# Patient Record
Sex: Female | Born: 1995 | Race: Black or African American | Hispanic: No | Marital: Single | Smoking: Never smoker
Health system: Southern US, Community
[De-identification: ages and names within clinical notes are randomized; demographics above are authoritative.]

## PROBLEM LIST (undated history)

## (undated) ENCOUNTER — Emergency Department (HOSPITAL_BASED_OUTPATIENT_CLINIC_OR_DEPARTMENT_OTHER): Admission: EM | Payer: MEDICAID

## (undated) DIAGNOSIS — J45909 Unspecified asthma, uncomplicated: Secondary | ICD-10-CM

## (undated) DIAGNOSIS — E119 Type 2 diabetes mellitus without complications: Secondary | ICD-10-CM

## (undated) DIAGNOSIS — N809 Endometriosis, unspecified: Secondary | ICD-10-CM

## (undated) DIAGNOSIS — F419 Anxiety disorder, unspecified: Secondary | ICD-10-CM

## (undated) DIAGNOSIS — J189 Pneumonia, unspecified organism: Secondary | ICD-10-CM

## (undated) DIAGNOSIS — L732 Hidradenitis suppurativa: Secondary | ICD-10-CM

## (undated) DIAGNOSIS — Z789 Other specified health status: Secondary | ICD-10-CM

## (undated) HISTORY — PX: TONSILLECTOMY: SUR1361

## (undated) HISTORY — DX: Hidradenitis suppurativa: L73.2

## (undated) HISTORY — PX: CRANIOTOMY FOR REPAIR DURAL / CSF LEAK: SUR338

## (undated) HISTORY — PX: EYE SURGERY: SHX253

---
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
## 2014-07-30 ENCOUNTER — Encounter: Payer: Self-pay | Admitting: Emergency Medicine

## 2014-07-30 ENCOUNTER — Emergency Department
Admission: EM | Admit: 2014-07-30 | Discharge: 2014-07-30 | Disposition: A | Discharge status: Home / Self Care | Payer: BLUE CROSS/BLUE SHIELD | Attending: Emergency Medicine | Admitting: Emergency Medicine

## 2014-07-30 ENCOUNTER — Emergency Department: Payer: BLUE CROSS/BLUE SHIELD

## 2014-07-30 DIAGNOSIS — J45901 Unspecified asthma with (acute) exacerbation: Secondary | ICD-10-CM | POA: Insufficient documentation

## 2014-07-30 DIAGNOSIS — E119 Type 2 diabetes mellitus without complications: Secondary | ICD-10-CM | POA: Insufficient documentation

## 2014-07-30 DIAGNOSIS — J209 Acute bronchitis, unspecified: Secondary | ICD-10-CM

## 2014-07-30 DIAGNOSIS — J45909 Unspecified asthma, uncomplicated: Secondary | ICD-10-CM

## 2014-07-30 DIAGNOSIS — R05 Cough: Secondary | ICD-10-CM | POA: Diagnosis present

## 2014-07-30 HISTORY — DX: Type 2 diabetes mellitus without complications: E11.9

## 2014-07-30 HISTORY — DX: Unspecified asthma, uncomplicated: J45.909

## 2014-07-30 MED ORDER — LEVOFLOXACIN 750 MG PO TABS: 750.0000 mg | ORAL_TABLET | Freq: Every day | ORAL | Status: AC

## 2014-07-30 MED ORDER — ALBUTEROL SULFATE (2.5 MG/3ML) 0.083% IN NEBU
2.5000 mg | INHALATION_SOLUTION | Freq: Once | RESPIRATORY_TRACT | Status: AC
  Administered 2014-07-30: 2.5 mg via RESPIRATORY_TRACT

## 2014-07-30 MED ORDER — IPRATROPIUM-ALBUTEROL 0.5-2.5 (3) MG/3ML IN SOLN
RESPIRATORY_TRACT | Status: AC
  Filled 2014-07-30: qty 3

## 2014-07-30 MED ORDER — ALBUTEROL SULFATE (2.5 MG/3ML) 0.083% IN NEBU: 2.5000 mg | INHALATION_SOLUTION | Freq: Four times a day (QID) | RESPIRATORY_TRACT | Status: DC | PRN

## 2014-07-30 MED ORDER — IPRATROPIUM-ALBUTEROL 0.5-2.5 (3) MG/3ML IN SOLN
3.0000 mL | Freq: Once | RESPIRATORY_TRACT | Status: AC
  Administered 2014-07-30: 3 mL via RESPIRATORY_TRACT

## 2014-07-30 MED ORDER — ALBUTEROL SULFATE (2.5 MG/3ML) 0.083% IN NEBU
INHALATION_SOLUTION | RESPIRATORY_TRACT | Status: AC
  Filled 2014-07-30: qty 3

## 2014-07-30 MED ORDER — ALBUTEROL SULFATE HFA 108 (90 BASE) MCG/ACT IN AERS: 2.0000 | INHALATION_SPRAY | Freq: Four times a day (QID) | RESPIRATORY_TRACT | Status: DC | PRN

## 2014-07-30 NOTE — ED Notes (Signed)
Productive cough

## 2014-07-30 NOTE — ED Provider Notes (Signed)
[facility] Emergency Department Provider Note  ____________________________________________  Time seen: Approximately 3:02 PM  I have reviewed the triage vital signs and the nursing notes.   HISTORY  Chief Complaint Cough    HPI Shari [name] is a 19 y.o. female who presents to the emergency department for cough and wheezing. She states that her primary care provider started her on prednisone 2 days ago. She also states that she has been using her albuterol inhaler without any relief. She states that the cough is now productive and the wheezing is getting worse. She denies fever.   Past Medical History  Diagnosis Date  . Asthma   . Diabetes mellitus without complication     There are no active problems to display for this patient.   Past Surgical History  Procedure Laterality Date  . Eye surgery    . Tonsillectomy      No current outpatient prescriptions on file.  Allergies Review of patient's allergies indicates no known allergies.  No family history on file.  Social History History  Substance Use Topics  . Smoking status: Never Smoker   . Smokeless tobacco: Not on file  . Alcohol Use: No    Review of Systems Constitutional: No fever/chills Eyes: No visual changes. ENT: No sore throat. Cardiovascular: Denies chest pain. Respiratory: Mild shortness of breath. Gastrointestinal: No abdominal pain.  No nausea, no vomiting.  No diarrhea.  No constipation. Genitourinary: Negative for dysuria. Musculoskeletal: Negative for back pain. Skin: Negative for rash. Neurological: Negative for headaches, focal weakness or numbness.  10-point ROS otherwise negative.  ____________________________________________   PHYSICAL EXAM:  VITAL SIGNS: ED Triage Vitals  Enc Vitals Group     BP 07/30/14 1359 130/57 mmHg     Pulse Rate 07/30/14 1359 86     Resp 07/30/14 1359 16     Temp 07/30/14 1359 98.5 F (36.9 C)     Temp Source 07/30/14  1359 Oral     SpO2 07/30/14 1359 100 %     Weight 07/30/14 1359 262 lb (118.842 kg)     Height 07/30/14 1359 5\' 8"  (1.727 m)     Head Cir --      Peak Flow --      Pain Score 07/30/14 1401 8     Pain Loc --      Pain Edu? --      Excl. in [place]? --     Constitutional: Alert and oriented. Well appearing and in no acute distress. Eyes: Conjunctivae are normal. PERRL. EOMI. Head: Atraumatic. Nose: No congestion/rhinnorhea. Mouth/Throat: Mucous membranes are moist.  Oropharynx non-erythematous. Neck: No stridor.   Cardiovascular: Normal rate, regular rhythm. Grossly normal heart sounds.  Good peripheral circulation. Respiratory: Normal respiratory effort.  No retractions. Expiratory wheezes noted throughout lung fields. Gastrointestinal: Soft and nontender. No distention. No abdominal bruits. No CVA tenderness. Musculoskeletal: No lower extremity tenderness nor edema.  No joint effusions. Neurologic:  Normal speech and language. No gross focal neurologic deficits are appreciated. Speech is normal. No gait instability. Skin:  Skin is warm, dry and intact. No rash noted. Psychiatric: Mood and affect are normal. Speech and behavior are normal.  ____________________________________________   LABS (all labs ordered are listed, but only abnormal results are displayed)  Labs Reviewed - No data to display ____________________________________________  EKG   ____________________________________________  RADIOLOGY  Chest x-ray viewed by me. No infiltrate or opacity noted. Radiology reading concurs. ____________________________________________   PROCEDURES  Procedure(s) performed: None  Critical  Care performed: No  ____________________________________________   INITIAL IMPRESSION / ASSESSMENT AND PLAN / ED COURSE  Pertinent labs & imaging results that were available during my care of the patient were reviewed by me and considered in my medical decision making (see chart for  details).  Patient received a DuoNeb treatment and an albuterol treatment while in the emergency department. Air movement much improved after the second treatment. She was advised to continue the prednisone. She will receive a refill on her albuterol nebulizer solution. She was advised to use her Ventolin inhaler or nebulizer every 4 hours for the next 24 hours. She was advised to take the antibiotic as prescribed without skipping doses.  Patient was advised to follow up with her primary care provider for symptoms that are not improving over the next 2-3 days. She was advised to return to the ER for symptoms that change or worsen if she is unable to schedule an appointment.  ____________________________________________   FINAL CLINICAL IMPRESSION(S) / ED DIAGNOSES  Final diagnoses:  None      Victorino Dike, FNP 07/30/14 1822  Nena Polio, MD 07/31/14 [phone]

## 2014-07-30 NOTE — Discharge Instructions (Signed)

## 2014-08-02 ENCOUNTER — Encounter: Payer: Self-pay | Admitting: *Deleted

## 2014-08-02 ENCOUNTER — Emergency Department
Admission: EM | Admit: 2014-08-02 | Discharge: 2014-08-02 | Disposition: A | Discharge status: Home / Self Care | Payer: BLUE CROSS/BLUE SHIELD | Attending: Emergency Medicine | Admitting: Emergency Medicine

## 2014-08-02 DIAGNOSIS — Z3202 Encounter for pregnancy test, result negative: Secondary | ICD-10-CM | POA: Insufficient documentation

## 2014-08-02 DIAGNOSIS — Z7951 Long term (current) use of inhaled steroids: Secondary | ICD-10-CM | POA: Diagnosis not present

## 2014-08-02 DIAGNOSIS — Z79899 Other long term (current) drug therapy: Secondary | ICD-10-CM | POA: Diagnosis not present

## 2014-08-02 DIAGNOSIS — E119 Type 2 diabetes mellitus without complications: Secondary | ICD-10-CM | POA: Insufficient documentation

## 2014-08-02 DIAGNOSIS — R42 Dizziness and giddiness: Secondary | ICD-10-CM | POA: Diagnosis present

## 2014-08-02 DIAGNOSIS — R55 Syncope and collapse: Secondary | ICD-10-CM | POA: Diagnosis not present

## 2014-08-02 LAB — CBC WITH DIFFERENTIAL/PLATELET
Basophils Absolute: 0.1 10*3/uL (ref 0–0.1)
Basophils Relative: 0 %
EOS ABS: 0 10*3/uL (ref 0–0.7)
Eosinophils Relative: 0 %
HCT: 36.1 % (ref 35.0–47.0)
Hemoglobin: 11.5 g/dL — ABNORMAL LOW (ref 12.0–16.0)
Lymphocytes Relative: 18 %
Lymphs Abs: 2.4 10*3/uL (ref 1.0–3.6)
MCH: 25.3 pg — AB (ref 26.0–34.0)
MCHC: 31.7 g/dL — ABNORMAL LOW (ref 32.0–36.0)
MCV: 79.9 fL — AB (ref 80.0–100.0)
MONOS PCT: 3 %
Monocytes Absolute: 0.3 10*3/uL (ref 0.2–0.9)
Neutro Abs: 10.1 10*3/uL — ABNORMAL HIGH (ref 1.4–6.5)
Neutrophils Relative %: 79 %
Platelets: 485 10*3/uL — ABNORMAL HIGH (ref 150–440)
RBC: 4.52 MIL/uL (ref 3.80–5.20)
RDW: 17.2 % — ABNORMAL HIGH (ref 11.5–14.5)
WBC: 12.9 10*3/uL — ABNORMAL HIGH (ref 3.6–11.0)

## 2014-08-02 LAB — URINALYSIS COMPLETE WITH MICROSCOPIC (ARMC ONLY)
BACTERIA UA: NONE SEEN
BILIRUBIN URINE: NEGATIVE
Glucose, UA: NEGATIVE mg/dL
Ketones, ur: NEGATIVE mg/dL
Nitrite: NEGATIVE
PH: 6 (ref 5.0–8.0)
Protein, ur: NEGATIVE mg/dL
Specific Gravity, Urine: 1.016 (ref 1.005–1.030)

## 2014-08-02 LAB — COMPREHENSIVE METABOLIC PANEL
ALK PHOS: 76 U/L (ref 38–126)
ALT: 14 U/L (ref 14–54)
AST: 15 U/L (ref 15–41)
Albumin: 3.7 g/dL (ref 3.5–5.0)
Anion gap: 9 (ref 5–15)
BILIRUBIN TOTAL: 0.2 mg/dL — AB (ref 0.3–1.2)
BUN: 16 mg/dL (ref 6–20)
CHLORIDE: 104 mmol/L (ref 101–111)
CO2: 25 mmol/L (ref 22–32)
Calcium: 8.8 mg/dL — ABNORMAL LOW (ref 8.9–10.3)
Creatinine, Ser: 0.79 mg/dL (ref 0.44–1.00)
GFR calc Af Amer: >60 mL/min (ref >60)
Glucose, Bld: 109 mg/dL — ABNORMAL HIGH (ref 65–99)
Potassium: 4.1 mmol/L (ref 3.5–5.1)
SODIUM: 138 mmol/L (ref 135–145)
Total Protein: 8.3 g/dL — ABNORMAL HIGH (ref 6.5–8.1)

## 2014-08-02 LAB — PREGNANCY, URINE: Preg Test, Ur: NEGATIVE

## 2014-08-02 LAB — TROPONIN I: Troponin I: <0.03 ng/mL (ref <0.031)

## 2014-08-02 NOTE — ED Notes (Signed)
Mom requested for hot towels be placed on her daughters arms, her veins are small, placed in subwait

## 2014-08-02 NOTE — ED Notes (Signed)
Seen here recently placed on antibiotics for poss pneumonia, , today c/o dizziness, some nausea, headache

## 2014-08-02 NOTE — ED Provider Notes (Addendum)
[facility] Emergency Department Provider Note  Time seen: 4:30 PM  I have reviewed the triage vital signs and the nursing notes.   HISTORY  Chief Complaint Dizziness    HPI Shari [name] is a 19 y.o. female with a past medical history of asthma and diabetes presents the emergency department with dizziness/lightheadedness. According to the patient she was at school today when she began feeling dizzy and lightheaded. She states her vision darkened like she was going to pass out so she sat down and the symptoms passed. She went home to try to sleep thinking that might make her feel better, when she awoke she was no better so she came to the emergency department for evaluation. Patient does note she has not been taking her diabetic medications for the last week, and has been peeing much more frequently than normal. Denies any chest pain or shortness of breath at any time. The patient was recently diagnosed with a possible pneumonia and placed on antibiotics as well as prednisone for an asthma exacerbation.     Past Medical History  Diagnosis Date  . Asthma   . Diabetes mellitus without complication     There are no active problems to display for this patient.   Past Surgical History  Procedure Laterality Date  . Eye surgery    . Tonsillectomy      Current Outpatient Rx  Name  Route  Sig  Dispense  Refill  . albuterol (PROVENTIL HFA;VENTOLIN HFA) 108 (90 BASE) MCG/ACT inhaler   Inhalation   Inhale 2 puffs into the lungs every 6 (six) hours as needed for wheezing or shortness of breath.   1 Inhaler   2   . albuterol (PROVENTIL) (2.5 MG/3ML) 0.083% nebulizer solution   Nebulization   Take 3 mLs (2.5 mg total) by nebulization every 6 (six) hours as needed for wheezing or shortness of breath.   75 mL   0   . levofloxacin (LEVAQUIN) 750 MG tablet   Oral   Take 1 tablet (750 mg total) by mouth daily.   7 tablet   0     Allergies Review of  patient's allergies indicates no known allergies.  No family history on file.  Social History History  Substance Use Topics  . Smoking status: Never Smoker   . Smokeless tobacco: Not on file  . Alcohol Use: No    Review of Systems Constitutional: Negative for fever. Cardiovascular: Negative for chest pain. Respiratory: Negative for shortness of breath. Gastrointestinal: Negative for abdominal pain Genitourinary: Negative for dysuria. Currently on her menstrual cycle which she states is light. Neurological: Negative for headache 10-point ROS otherwise negative.  ____________________________________________   PHYSICAL EXAM:  VITAL SIGNS: ED Triage Vitals  Enc Vitals Group     BP 08/02/14 1524 130/67 mmHg     Pulse Rate 08/02/14 1524 86     Resp 08/02/14 1524 20     Temp 08/02/14 1524 98.9 F (37.2 C)     Temp Source 08/02/14 1524 Oral     SpO2 08/02/14 1524 96 %     Weight 08/02/14 1524 262 lb (118.842 kg)     Height 08/02/14 1524 5\' 8"  (1.727 m)     Head Cir --      Peak Flow --      Pain Score 08/02/14 1525 0     Pain Loc --      Pain Edu? --      Excl. in [place]? --  Constitutional: Alert and oriented. Well appearing and in no distress. Eyes: Normal exam ENT   Mouth/Throat: Mucous membranes are moist. Cardiovascular: Normal rate, regular rhythm. No murmur Respiratory: Normal respiratory effort without tachypnea nor retractions. Breath sounds are clear and equal bilaterally. Mild wheeze. Gastrointestinal: Soft and nontender. No distention.   Musculoskeletal: Nontender with normal range of motion in all extremities. No lower extremity tenderness or edema. Neurologic:  Normal speech and language. No gross focal neurologic deficits  Skin:  Skin is warm, dry and intact.  Psychiatric: Mood and affect are normal. Speech and behavior are normal.   ____________________________________________   INITIAL IMPRESSION / ASSESSMENT AND PLAN / ED COURSE  Pertinent  labs & imaging results that were available during my care of the patient were reviewed by me and considered in my medical decision making (see chart for details).  Patient with lightheadedness today. We will check labs to help further evaluate. Patient has not been taking her diabetic medications and has been peeing more frequently than normal, suspect hyperglycemia possibly leading to dehydration which could be the cause of the patient's symptoms today. Patient does have mild wheeze on exam, she states no shortness of breath more than normal currently.    Labs are within normal limits, currently awaiting pregnancy test result. I discussed results with the patient recent thinks her symptoms might be due to the prednisone use, this is possible, but I believe the patient should still continue her prednisone. I discussed increasing by mouth fluids and taking her home medications as prescribed. The patient is agreeable to plan.  Urine pregnancy test has not yet resulted, the patient denies any chance of being pregnant and does not wish to wait for this test. We will discharge patient home with supportive care and primary care follow-up.  ____________________________________________   FINAL CLINICAL IMPRESSION(S) / ED DIAGNOSES  Near syncope   Harvest Dark, MD 08/02/14 1739  Harvest Dark, MD 08/02/14 [phone]

## 2014-08-02 NOTE — Discharge Instructions (Signed)
Near-Syncope Near-syncope (commonly known as near fainting) is sudden weakness, dizziness, or feeling like you might pass out. During an episode of near-syncope, you may also develop pale skin, have tunnel vision, or feel sick to your stomach (nauseous). Near-syncope may occur when getting up after sitting or while standing for a long time. It is caused by a sudden decrease in blood flow to the brain. This decrease can result from various causes or triggers, most of which are not serious. However, because near-syncope can sometimes be a sign of something serious, a medical evaluation is required. The specific cause is often not determined. HOME CARE INSTRUCTIONS  Monitor your condition for any changes. The following actions may help to alleviate any discomfort you are experiencing:  Have someone stay with you until you feel stable.  Lie down right away and prop your feet up if you start feeling like you might faint. Breathe deeply and steadily. Wait until all the symptoms have passed. Most of these episodes last only a few minutes. You may feel tired for several hours.   Drink enough fluids to keep your urine clear or pale yellow.   If you are taking blood pressure or heart medicine, get up slowly when seated or lying down. Take several minutes to sit and then stand. This can reduce dizziness.  Follow up with your health care provider as directed. SEEK IMMEDIATE MEDICAL CARE IF:   You have a severe headache.   You have unusual pain in the chest, abdomen, or back.   You are bleeding from the mouth or rectum, or you have black or tarry stool.   You have an irregular or very fast heartbeat.   You have repeated fainting or have seizure-like jerking during an episode.   You faint when sitting or lying down.   You have confusion.   You have difficulty walking.   You have severe weakness.   You have vision problems.  MAKE SURE YOU:   Understand these instructions.  Will  watch your condition.  Will get help right away if you are not doing well or get worse. Document Released: 01/14/2005 Document Revised: 01/19/2013 Document Reviewed: 06/19/2012 [facility] Patient Information 2015 [place], [place]. This information is not intended to replace advice given to you by your health care provider. Make sure you discuss any questions you have with your health care provider.     As we have discussed your workup today shows normal results. Please follow-up with your primary care doctor soon as possible regarding today's ER visit and your symptoms. Please drink plenty of fluids at home, and take your home medications as prescribed. Return to the emergency department for any further near syncopal episodes, if you feel like you going to pass out, or any other symptom personally concerning to your self.

## 2014-11-19 ENCOUNTER — Encounter: Payer: Self-pay | Admitting: Emergency Medicine

## 2014-11-19 DIAGNOSIS — E119 Type 2 diabetes mellitus without complications: Secondary | ICD-10-CM | POA: Insufficient documentation

## 2014-11-19 DIAGNOSIS — Y9289 Other specified places as the place of occurrence of the external cause: Secondary | ICD-10-CM | POA: Diagnosis not present

## 2014-11-19 DIAGNOSIS — S0990XA Unspecified injury of head, initial encounter: Secondary | ICD-10-CM | POA: Insufficient documentation

## 2014-11-19 DIAGNOSIS — Y998 Other external cause status: Secondary | ICD-10-CM | POA: Diagnosis not present

## 2014-11-19 DIAGNOSIS — Y9389 Activity, other specified: Secondary | ICD-10-CM | POA: Diagnosis not present

## 2014-11-19 NOTE — ED Notes (Addendum)
Pt says she was getting into a car last night when she hit her head on the right side near her temple; headache since; ambulatory with steady gait; pt was given tylenol by her boss at work tonight which didn't help; so her manager said she needed to come in to be seen; talking in complete coherent sentences;

## 2014-11-20 ENCOUNTER — Emergency Department
Admission: EM | Admit: 2014-11-20 | Discharge: 2014-11-20 | Disposition: A | Discharge status: Home / Self Care | Payer: BLUE CROSS/BLUE SHIELD | Attending: Emergency Medicine | Admitting: Emergency Medicine

## 2014-11-20 DIAGNOSIS — R51 Headache: Secondary | ICD-10-CM

## 2014-11-20 DIAGNOSIS — R519 Headache, unspecified: Secondary | ICD-10-CM

## 2014-11-20 MED ORDER — IBUPROFEN 800 MG PO TABS: 800.0000 mg | ORAL_TABLET | Freq: Three times a day (TID) | ORAL | Status: DC | PRN

## 2014-11-20 MED ORDER — BUTALBITAL-APAP-CAFFEINE 50-325-40 MG PO TABS: 1.0000 | ORAL_TABLET | Freq: Four times a day (QID) | ORAL | Status: AC | PRN

## 2014-11-20 NOTE — ED Notes (Signed)
Pt in front lobby reporting to leave for work. Agricultural consultant notified. Patient moved to flex.

## 2014-11-20 NOTE — ED Notes (Signed)
Patient took 2 aleve PM on Friday night before bed, slept well. Went to work today (Saturday) at 1500. After being for awhile head began to hurt, some dizziness and eye pain and right ear pain.  Finished her shift but was told by friends and co-workers she should come here to be checked out.

## 2014-11-20 NOTE — ED Notes (Addendum)
Pt requesting to leave, MD notified. Pt denies any pain at present, will continue to monitor.

## 2014-11-20 NOTE — ED Notes (Signed)
Pt out to lobby from room 15, states she has not been seen by a provider and has been here for nine hours. Pt states her headache is better and she has to leave to go to work. Charge RN notified and pt agrees to stay and will be seen in flex care.  No acute distress noted of pt.

## 2014-11-20 NOTE — ED Provider Notes (Signed)
-----------------------------------------   6:47 AM on 11/20/2014 -----------------------------------------  I am unclear as to why my name was assigned to this patient. This patient was not seen by me. Nursing apologized to her for her long wait; she will be seen in Flex this morning.  Paulette Blanch, MD 11/20/14 [phone]

## 2014-11-20 NOTE — Discharge Instructions (Signed)

## 2014-12-07 NOTE — ED Provider Notes (Signed)
[facility] Emergency Department Provider Note  ____________________________________________  Time seen: Approximately 7:35AM  I have reviewed the triage vital signs and the nursing notes.   HISTORY  Chief Complaint Headache   HPI Shari [name] is a 19 y.o. female who presents to the emergency department for evaluation of a headache. She states that while getting into her car night before last, she hit her head on the frame and began to have a headache. After she went to work yesterday, her headache continued. No relief with tylenol.   Location: Temporal area on right Similar to previous headaches: No Duration: Constant TIMING:After striking head on door frame SEVERITY:Gone now QUALITY:throb CONTEXT:n/a MODIFYING FACTORS: none ASSOCIATED SYMPTOMS: none  Past Medical History  Diagnosis Date  . Asthma   . Diabetes mellitus without complication ([place])     There are no active problems to display for this patient.   Past Surgical History  Procedure Laterality Date  . Eye surgery    . Tonsillectomy      Current Outpatient Rx  Name  Route  Sig  Dispense  Refill  . albuterol (PROVENTIL HFA;VENTOLIN HFA) 108 (90 BASE) MCG/ACT inhaler   Inhalation   Inhale 2 puffs into the lungs every 6 (six) hours as needed for wheezing or shortness of breath.   1 Inhaler   2   . albuterol (PROVENTIL) (2.5 MG/3ML) 0.083% nebulizer solution   Nebulization   Take 3 mLs (2.5 mg total) by nebulization every 6 (six) hours as needed for wheezing or shortness of breath.   75 mL   0   . butalbital-acetaminophen-caffeine (FIORICET) 50-325-40 MG tablet   Oral   Take 1-2 tablets by mouth every 6 (six) hours as needed for headache.   10 tablet   0   . ibuprofen (ADVIL,MOTRIN) 800 MG tablet   Oral   Take 1 tablet (800 mg total) by mouth every 8 (eight) hours as needed.   30 tablet   0     Allergies Review of patient's allergies indicates no known  allergies.  History reviewed. No pertinent family history.  Social History Social History  Substance Use Topics  . Smoking status: Never Smoker   . Smokeless tobacco: None  . Alcohol Use: No    Review of Systems Constitutional: No fever/chills Eyes: No visual changes. ENT: No sore throat. Cardiovascular: Denies chest pain. Respiratory: Denies shortness of breath. Gastrointestinal: No abdominal pain.  No nausea, no vomiting.  No diarrhea.  No constipation. Genitourinary: Negative for dysuria or incontinence. Musculoskeletal: Negative for pain. Skin: Negative for rash. Neurological:Positive for headache that has resolved, focal weakness or numbness. No confusion or fainting. Psychiatric:No anxiety or depression  10-point ROS otherwise negative.  ____________________________________________   PHYSICAL EXAM:  VITAL SIGNS: ED Triage Vitals  Enc Vitals Group     BP 11/19/14 2227 150/74 mmHg     Pulse Rate 11/19/14 2227 84     Resp 11/19/14 2227 18     Temp 11/19/14 2227 98.6 F (37 C)     Temp Source 11/19/14 2227 Oral     SpO2 11/19/14 2227 99 %     Weight 11/19/14 2227 280 lb (127.007 kg)     Height 11/19/14 2227 5\' 8"  (1.727 m)     Head Cir --      Peak Flow --      Pain Score 11/19/14 2228 8     Pain Loc --      Pain Edu? --  Excl. in [place]? --     Constitutional: Alert and oriented. Well appearing and in no acute distress. Eyes: Conjunctivae are normal. PERRL. EOMI. No evidence of papilledema on limited exam. Head: Atraumatic. Nose: No congestion/rhinnorhea. Mouth/Throat: Mucous membranes are moist.  Oropharynx non-erythematous. Neck: No stridor. No meningismus.   Cardiovascular: Normal rate, regular rhythm. Grossly normal heart sounds.  Good peripheral circulation. Respiratory: Normal respiratory effort.  No retractions. Lungs CTAB. Gastrointestinal: Soft and nontender. No distention. No abdominal bruits. No CVA tenderness. Musculoskeletal: No lower  extremity tenderness nor edema.  No joint effusions. Neurologic:  Normal speech and language. No gross focal neurologic deficits are appreciated. No gait instability.  Cranial nerves: 2-10 normal as tested.  Cerebellar: Normal Romberg, finger-nose-finger, normal gait. Sensorimotor: No aphasia, pronator drift, clonus, sensory loss or abnormal reflexes.  Skin:  Skin is warm, dry and intact. No rash noted. Psychiatric: Mood and affect are normal. Speech and behavior are normal. Normal thought process and cognition.  ____________________________________________   LABS (all labs ordered are listed, but only abnormal results are displayed)  Labs Reviewed - No data to display ____________________________________________  EKG   ____________________________________________  RADIOLOGY   ____________________________________________   PROCEDURES  Procedure(s) performed: None  Critical Care performed: No  ____________________________________________   INITIAL IMPRESSION / ASSESSMENT AND PLAN / ED COURSE  Pertinent labs & imaging results that were available during my care of the patient were reviewed by me and considered in my medical decision making (see chart for details).  Upon evaluation, patient states her headache has resolved and is ready to leave. No obvious neuro deficits.   Patient was advised to follow up with the primary care provider for symptoms that return, are not relieved or are not improved over the next 24 hours. Also advised to return to the emergency department for symptoms that change or worsen if unable to schedule an appointment. ____________________________________________   FINAL CLINICAL IMPRESSION(S) / ED DIAGNOSES  Final diagnoses:  Intractable headache     Victorino Dike, FNP 12/07/14 2138  Lisa Roca, MD 12/09/14 [phone]

## 2015-05-02 DIAGNOSIS — M216X9 Other acquired deformities of unspecified foot: Secondary | ICD-10-CM | POA: Insufficient documentation

## 2015-05-02 DIAGNOSIS — M79671 Pain in right foot: Secondary | ICD-10-CM | POA: Insufficient documentation

## 2015-05-02 DIAGNOSIS — M79672 Pain in left foot: Secondary | ICD-10-CM

## 2015-09-07 ENCOUNTER — Other Ambulatory Visit: Payer: Self-pay | Admitting: Family Medicine

## 2015-09-07 DIAGNOSIS — N63 Unspecified lump in unspecified breast: Secondary | ICD-10-CM | POA: Insufficient documentation

## 2015-09-07 DIAGNOSIS — N6310 Unspecified lump in the right breast, unspecified quadrant: Secondary | ICD-10-CM

## 2015-09-07 DIAGNOSIS — I889 Nonspecific lymphadenitis, unspecified: Secondary | ICD-10-CM | POA: Insufficient documentation

## 2015-09-11 ENCOUNTER — Ambulatory Visit
Admission: RE | Admit: 2015-09-11 | Discharge: 2015-09-11 | Disposition: A | Discharge status: Home / Self Care | Payer: BLUE CROSS/BLUE SHIELD | Source: Ambulatory Visit | Attending: Family Medicine | Admitting: Family Medicine

## 2015-09-11 DIAGNOSIS — N63 Unspecified lump in breast: Secondary | ICD-10-CM | POA: Diagnosis present

## 2015-09-11 DIAGNOSIS — N6310 Unspecified lump in the right breast, unspecified quadrant: Secondary | ICD-10-CM

## 2015-09-13 ENCOUNTER — Ambulatory Visit: Payer: No Typology Code available for payment source

## 2015-09-21 ENCOUNTER — Emergency Department
Admission: EM | Admit: 2015-09-21 | Discharge: 2015-09-21 | Disposition: A | Discharge status: Home / Self Care | Payer: BLUE CROSS/BLUE SHIELD | Attending: Emergency Medicine | Admitting: Emergency Medicine

## 2015-09-21 ENCOUNTER — Encounter: Payer: Self-pay | Admitting: Emergency Medicine

## 2015-09-21 DIAGNOSIS — Z7984 Long term (current) use of oral hypoglycemic drugs: Secondary | ICD-10-CM | POA: Insufficient documentation

## 2015-09-21 DIAGNOSIS — M791 Myalgia: Secondary | ICD-10-CM | POA: Diagnosis not present

## 2015-09-21 DIAGNOSIS — Z791 Long term (current) use of non-steroidal anti-inflammatories (NSAID): Secondary | ICD-10-CM | POA: Insufficient documentation

## 2015-09-21 DIAGNOSIS — J45909 Unspecified asthma, uncomplicated: Secondary | ICD-10-CM | POA: Insufficient documentation

## 2015-09-21 DIAGNOSIS — R202 Paresthesia of skin: Secondary | ICD-10-CM | POA: Diagnosis not present

## 2015-09-21 DIAGNOSIS — E119 Type 2 diabetes mellitus without complications: Secondary | ICD-10-CM | POA: Insufficient documentation

## 2015-09-21 DIAGNOSIS — M7918 Myalgia, other site: Secondary | ICD-10-CM

## 2015-09-21 DIAGNOSIS — Z79899 Other long term (current) drug therapy: Secondary | ICD-10-CM | POA: Insufficient documentation

## 2015-09-21 DIAGNOSIS — M79661 Pain in right lower leg: Secondary | ICD-10-CM | POA: Diagnosis present

## 2015-09-21 LAB — CBC WITH DIFFERENTIAL/PLATELET
BASOS ABS: 0.1 10*3/uL (ref 0–0.1)
BASOS PCT: 1 %
EOS PCT: 5 %
Eosinophils Absolute: 0.4 10*3/uL (ref 0–0.7)
HEMATOCRIT: 33.5 % — AB (ref 35.0–47.0)
Hemoglobin: 11.4 g/dL — ABNORMAL LOW (ref 12.0–16.0)
LYMPHS PCT: 31 %
Lymphs Abs: 2.6 10*3/uL (ref 1.0–3.6)
MCH: 27.4 pg (ref 26.0–34.0)
MCHC: 34.2 g/dL (ref 32.0–36.0)
MCV: 80.1 fL (ref 80.0–100.0)
Monocytes Absolute: 0.4 10*3/uL (ref 0.2–0.9)
Monocytes Relative: 5 %
NEUTROS ABS: 4.7 10*3/uL (ref 1.4–6.5)
Neutrophils Relative %: 58 %
PLATELETS: 379 10*3/uL (ref 150–440)
RBC: 4.18 MIL/uL (ref 3.80–5.20)
RDW: 17.2 % — AB (ref 11.5–14.5)
WBC: 8.2 10*3/uL (ref 3.6–11.0)

## 2015-09-21 LAB — COMPREHENSIVE METABOLIC PANEL
ALT: 15 U/L (ref 14–54)
AST: 23 U/L (ref 15–41)
Albumin: 3.7 g/dL (ref 3.5–5.0)
Alkaline Phosphatase: 74 U/L (ref 38–126)
Anion gap: 8 (ref 5–15)
BUN: 10 mg/dL (ref 6–20)
CHLORIDE: 104 mmol/L (ref 101–111)
CO2: 25 mmol/L (ref 22–32)
CREATININE: 0.72 mg/dL (ref 0.44–1.00)
Calcium: 8.7 mg/dL — ABNORMAL LOW (ref 8.9–10.3)
GFR calc Af Amer: >60 mL/min (ref >60)
GFR calc non Af Amer: >60 mL/min (ref >60)
Glucose, Bld: 135 mg/dL — ABNORMAL HIGH (ref 65–99)
Potassium: 3.4 mmol/L — ABNORMAL LOW (ref 3.5–5.1)
SODIUM: 137 mmol/L (ref 135–145)
Total Bilirubin: 0.7 mg/dL (ref 0.3–1.2)
Total Protein: 8 g/dL (ref 6.5–8.1)

## 2015-09-21 MED ORDER — GABAPENTIN 100 MG PO CAPS: 100.0000 mg | ORAL_CAPSULE | Freq: Three times a day (TID) | ORAL | 0 refills | Status: DC

## 2015-09-21 NOTE — ED Notes (Signed)
See triage note states she stand daily at work and usually has pain to feet.  But yesterday the pain did not stop to right leg  Also having some numbness to left toe  Denies any specific injury

## 2015-09-21 NOTE — ED Provider Notes (Cosign Needed)
[facility] Emergency Department Provider Note ____________________________________________  Time seen: Approximately 8:58 AM  I have reviewed the triage vital signs and the nursing notes.   HISTORY  Chief Complaint Leg Pain and Numbness    HPI Shari [name] is a 20 y.o. female who presents to the emergency department for pain and soreness to the right lower extremity for the past 2 days. She is also complaining of some numbness in the left foot/toes. She denies recent illness or injury. She took some Tylenol yesterday without relief.  Past Medical History:  Diagnosis Date  . Asthma   . Diabetes mellitus without complication ([place])     There are no active problems to display for this patient.   Past Surgical History:  Procedure Laterality Date  . EYE SURGERY    . TONSILLECTOMY      Prior to Admission medications   Medication Sig Start Date End Date Taking? Authorizing Provider  metFORMIN (GLUCOPHAGE) 500 MG tablet Take 500 mg by mouth 2 (two) times daily with a meal.   Yes Historical Provider, MD  albuterol (PROVENTIL HFA;VENTOLIN HFA) 108 (90 BASE) MCG/ACT inhaler Inhale 2 puffs into the lungs every 6 (six) hours as needed for wheezing or shortness of breath. 07/30/14   Victorino Dike, FNP  albuterol (PROVENTIL) (2.5 MG/3ML) 0.083% nebulizer solution Take 3 mLs (2.5 mg total) by nebulization every 6 (six) hours as needed for wheezing or shortness of breath. 07/30/14   Victorino Dike, FNP  butalbital-acetaminophen-caffeine (FIORICET) 50-325-40 MG tablet Take 1-2 tablets by mouth every 6 (six) hours as needed for headache. 11/20/14 11/20/15  Victorino Dike, FNP  gabapentin (NEURONTIN) 100 MG capsule Take 1 capsule (100 mg total) by mouth 3 (three) times daily. 09/21/15 09/20/16  Victorino Dike, FNP  ibuprofen (ADVIL,MOTRIN) 800 MG tablet Take 1 tablet (800 mg total) by mouth every 8 (eight) hours as needed. 11/20/14   Victorino Dike, FNP     Allergies Review of patient's allergies indicates no known allergies.  No family history on file.  Social History Social History  Substance Use Topics  . Smoking status: Never Smoker  . Smokeless tobacco: Never Used  . Alcohol use No    Review of Systems Constitutional: No recent illness. Cardiovascular: Denies chest pain or palpitations. Respiratory: Denies shortness of breath. Musculoskeletal: Pain in Right lower extremity and left foot Skin: Negative for rash, wound, lesion. Neurological: Positive for numbness in the left foot  ____________________________________________   PHYSICAL EXAM:  VITAL SIGNS: ED Triage Vitals  Enc Vitals Group     BP 09/21/15 0841 131/75     Pulse Rate 09/21/15 0841 90     Resp 09/21/15 0841 17     Temp 09/21/15 0841 98.8 F (37.1 C)     Temp Source 09/21/15 0841 Oral     SpO2 09/21/15 0841 97 %     Weight 09/21/15 0842 297 lb (134.7 kg)     Height 09/21/15 0842 5\' 8"  (1.727 m)     Head Circumference --      Peak Flow --      Pain Score 09/21/15 0841 10     Pain Loc --      Pain Edu? --      Excl. in [place]? --     Constitutional: Alert and oriented. Well appearing and in no acute distress. Eyes: Conjunctivae are normal. EOMI. Head: Atraumatic. Neck: No stridor.  Respiratory: Normal respiratory effort.   Musculoskeletal: Full range of  motion of all extremities and digits. Two point discrimination on the left foot and toes. Neurologic:  Normal speech and language. No gross focal neurologic deficits are appreciated. Speech is normal. No gait instability. Skin:  Skin is warm, dry and intact. Atraumatic. Psychiatric: Mood and affect are normal. Speech and behavior are normal.  ____________________________________________   LABS (all labs ordered are listed, but only abnormal results are displayed)  Labs Reviewed  COMPREHENSIVE METABOLIC PANEL - Abnormal; Notable for the following:       Result Value   Potassium 3.4 (*)     Glucose, Bld 135 (*)    Calcium 8.7 (*)    All other components within normal limits  CBC WITH DIFFERENTIAL/PLATELET - Abnormal; Notable for the following:    Hemoglobin 11.4 (*)    HCT 33.5 (*)    RDW 17.2 (*)    All other components within normal limits   ____________________________________________  RADIOLOGY  Not indicated ____________________________________________   PROCEDURES  Procedure(s) performed: None   ____________________________________________   INITIAL IMPRESSION / ASSESSMENT AND PLAN / ED COURSE  Pertinent labs & imaging results that were available during my care of the patient were reviewed by me and considered in my medical decision making (see chart for details).  Patient was prescribed gabapentin and advised to follow-up with her primary care provider. She was strongly encouraged to take her diabetes medications as prescribed. She was educated regarding for outcomes for those who do not manage her diabetes, which will include chronic impairment paresthesias and neuralgia. Patient agrees to take her medications as prescribed and states that she does not need a new prescription at this time. Patient was encouraged to return to emergency department for symptoms that change or worsen if she is unable schedule an appointment with her primary care provider. ____________________________________________   FINAL CLINICAL IMPRESSION(S) / ED DIAGNOSES  Final diagnoses:  Paresthesia  Musculoskeletal pain       Victorino Dike, FNP 09/21/15 1328

## 2015-09-21 NOTE — ED Triage Notes (Signed)
Patient presents to the ED pain/soreness to patient's right ankle, knee, and hip x 2 days.  Patient states she also started having numbness in her left foot since yesterday evening.  Patient denies any history of numbness and denies injury.  Patient is able to move toes in left foot easily and states she can feel when this RN touches her left foot.  Patient reports bilateral cramping to knees yesterday.

## 2015-12-06 ENCOUNTER — Emergency Department
Admission: EM | Admit: 2015-12-06 | Discharge: 2015-12-06 | Disposition: A | Discharge status: Other Acute Inpatient Hospital | Payer: BLUE CROSS/BLUE SHIELD | Attending: Emergency Medicine | Admitting: Emergency Medicine

## 2015-12-06 ENCOUNTER — Inpatient Hospital Stay
Admission: EM | Admit: 2015-12-06 | Discharge: 2015-12-11 | DRG: 885 | Disposition: A | Discharge status: Home / Self Care | Payer: BLUE CROSS/BLUE SHIELD | Source: Intra-hospital | Attending: Psychiatry | Admitting: Psychiatry

## 2015-12-06 DIAGNOSIS — E1165 Type 2 diabetes mellitus with hyperglycemia: Secondary | ICD-10-CM

## 2015-12-06 DIAGNOSIS — G4700 Insomnia, unspecified: Secondary | ICD-10-CM | POA: Diagnosis present

## 2015-12-06 DIAGNOSIS — E669 Obesity, unspecified: Secondary | ICD-10-CM

## 2015-12-06 DIAGNOSIS — G8929 Other chronic pain: Secondary | ICD-10-CM | POA: Diagnosis present

## 2015-12-06 DIAGNOSIS — Z818 Family history of other mental and behavioral disorders: Secondary | ICD-10-CM

## 2015-12-06 DIAGNOSIS — Z79899 Other long term (current) drug therapy: Secondary | ICD-10-CM | POA: Insufficient documentation

## 2015-12-06 DIAGNOSIS — F209 Schizophrenia, unspecified: Principal | ICD-10-CM | POA: Diagnosis present

## 2015-12-06 DIAGNOSIS — F23 Brief psychotic disorder: Secondary | ICD-10-CM

## 2015-12-06 DIAGNOSIS — F419 Anxiety disorder, unspecified: Secondary | ICD-10-CM | POA: Diagnosis present

## 2015-12-06 DIAGNOSIS — Z791 Long term (current) use of non-steroidal anti-inflammatories (NSAID): Secondary | ICD-10-CM | POA: Diagnosis not present

## 2015-12-06 DIAGNOSIS — E119 Type 2 diabetes mellitus without complications: Secondary | ICD-10-CM | POA: Insufficient documentation

## 2015-12-06 DIAGNOSIS — F29 Unspecified psychosis not due to a substance or known physiological condition: Secondary | ICD-10-CM | POA: Diagnosis present

## 2015-12-06 DIAGNOSIS — Z7984 Long term (current) use of oral hypoglycemic drugs: Secondary | ICD-10-CM

## 2015-12-06 DIAGNOSIS — J45909 Unspecified asthma, uncomplicated: Secondary | ICD-10-CM | POA: Diagnosis present

## 2015-12-06 DIAGNOSIS — E8881 Metabolic syndrome: Secondary | ICD-10-CM | POA: Diagnosis present

## 2015-12-06 DIAGNOSIS — K0889 Other specified disorders of teeth and supporting structures: Secondary | ICD-10-CM | POA: Diagnosis not present

## 2015-12-06 DIAGNOSIS — Z23 Encounter for immunization: Secondary | ICD-10-CM | POA: Diagnosis not present

## 2015-12-06 DIAGNOSIS — Z794 Long term (current) use of insulin: Secondary | ICD-10-CM

## 2015-12-06 DIAGNOSIS — F329 Major depressive disorder, single episode, unspecified: Secondary | ICD-10-CM | POA: Diagnosis present

## 2015-12-06 DIAGNOSIS — R440 Auditory hallucinations: Secondary | ICD-10-CM | POA: Diagnosis present

## 2015-12-06 DIAGNOSIS — Z6841 Body Mass Index (BMI) 40.0 and over, adult: Secondary | ICD-10-CM

## 2015-12-06 LAB — URINE DRUG SCREEN, QUALITATIVE (ARMC ONLY)
Amphetamines, Ur Screen: NOT DETECTED
Barbiturates, Ur Screen: NOT DETECTED
Benzodiazepine, Ur Scrn: NOT DETECTED
COCAINE METABOLITE, UR ~~LOC~~: NOT DETECTED
Cannabinoid 50 Ng, Ur ~~LOC~~: NOT DETECTED
MDMA (ECSTASY) UR SCREEN: NOT DETECTED
Methadone Scn, Ur: NOT DETECTED
OPIATE, UR SCREEN: NOT DETECTED
Phencyclidine (PCP) Ur S: NOT DETECTED
Tricyclic, Ur Screen: NOT DETECTED

## 2015-12-06 LAB — COMPREHENSIVE METABOLIC PANEL
ALBUMIN: 3.7 g/dL (ref 3.5–5.0)
ALT: 13 U/L — ABNORMAL LOW (ref 14–54)
AST: 19 U/L (ref 15–41)
Alkaline Phosphatase: 72 U/L (ref 38–126)
Anion gap: 7 (ref 5–15)
BUN: 9 mg/dL (ref 6–20)
CALCIUM: 8.6 mg/dL — AB (ref 8.9–10.3)
CO2: 24 mmol/L (ref 22–32)
Chloride: 106 mmol/L (ref 101–111)
Creatinine, Ser: 0.58 mg/dL (ref 0.44–1.00)
GFR calc Af Amer: >60 mL/min (ref >60)
GFR calc non Af Amer: >60 mL/min (ref >60)
GLUCOSE: 102 mg/dL — AB (ref 65–99)
Potassium: 3.7 mmol/L (ref 3.5–5.1)
SODIUM: 137 mmol/L (ref 135–145)
Total Bilirubin: 0.4 mg/dL (ref 0.3–1.2)
Total Protein: 8.1 g/dL (ref 6.5–8.1)

## 2015-12-06 LAB — CBC
HEMATOCRIT: 36.3 % (ref 35.0–47.0)
HEMOGLOBIN: 11.9 g/dL — AB (ref 12.0–16.0)
MCH: 26.6 pg (ref 26.0–34.0)
MCHC: 32.7 g/dL (ref 32.0–36.0)
MCV: 81.4 fL (ref 80.0–100.0)
Platelets: 368 10*3/uL (ref 150–440)
RBC: 4.46 MIL/uL (ref 3.80–5.20)
RDW: 16.8 % — ABNORMAL HIGH (ref 11.5–14.5)
WBC: 7.7 10*3/uL (ref 3.6–11.0)

## 2015-12-06 LAB — SALICYLATE LEVEL: Salicylate Lvl: <7 mg/dL (ref 2.8–30.0)

## 2015-12-06 LAB — ETHANOL: Alcohol, Ethyl (B): <5 mg/dL (ref <5)

## 2015-12-06 LAB — POCT PREGNANCY, URINE: Preg Test, Ur: NEGATIVE

## 2015-12-06 LAB — ACETAMINOPHEN LEVEL

## 2015-12-06 MED ORDER — MAGNESIUM HYDROXIDE 400 MG/5ML PO SUSP: 30.0000 mL | Freq: Every day | ORAL | Status: DC | PRN

## 2015-12-06 MED ORDER — BENZTROPINE MESYLATE 1 MG PO TABS: 1.0000 mg | ORAL_TABLET | Freq: Two times a day (BID) | ORAL | Status: DC | PRN

## 2015-12-06 MED ORDER — ALBUTEROL SULFATE HFA 108 (90 BASE) MCG/ACT IN AERS: 2.0000 | INHALATION_SPRAY | RESPIRATORY_TRACT | Status: DC | PRN

## 2015-12-06 MED ORDER — BENZTROPINE MESYLATE 0.5 MG PO TABS: 1.0000 mg | ORAL_TABLET | Freq: Two times a day (BID) | ORAL | Status: DC | PRN

## 2015-12-06 MED ORDER — ALBUTEROL SULFATE HFA 108 (90 BASE) MCG/ACT IN AERS
2.0000 | INHALATION_SPRAY | RESPIRATORY_TRACT | Status: DC | PRN
  Filled 2015-12-06: qty 6.7

## 2015-12-06 MED ORDER — ACETAMINOPHEN 325 MG PO TABS
650.0000 mg | ORAL_TABLET | Freq: Four times a day (QID) | ORAL | Status: DC | PRN
  Administered 2015-12-07 – 2015-12-11 (×5): 650 mg via ORAL
  Filled 2015-12-06 (×5): qty 2

## 2015-12-06 MED ORDER — HYDROXYZINE HCL 25 MG PO TABS
25.0000 mg | ORAL_TABLET | Freq: Three times a day (TID) | ORAL | Status: DC | PRN
  Administered 2015-12-07: 25 mg via ORAL
  Filled 2015-12-06: qty 1

## 2015-12-06 MED ORDER — ALUM & MAG HYDROXIDE-SIMETH 200-200-20 MG/5ML PO SUSP: 30.0000 mL | ORAL | Status: DC | PRN

## 2015-12-06 MED ORDER — METFORMIN HCL 500 MG PO TABS
500.0000 mg | ORAL_TABLET | Freq: Two times a day (BID) | ORAL | Status: DC
  Administered 2015-12-06: 500 mg via ORAL
  Filled 2015-12-06: qty 1

## 2015-12-06 MED ORDER — TRAZODONE HCL 100 MG PO TABS: 100.0000 mg | ORAL_TABLET | Freq: Every evening | ORAL | Status: DC | PRN

## 2015-12-06 MED ORDER — ARIPIPRAZOLE 10 MG PO TABS: 10.0000 mg | ORAL_TABLET | Freq: Every day | ORAL | Status: DC

## 2015-12-06 MED ORDER — ARIPIPRAZOLE 5 MG PO TABS
10.0000 mg | ORAL_TABLET | Freq: Every day | ORAL | Status: DC
  Administered 2015-12-06: 10 mg via ORAL
  Filled 2015-12-06: qty 2

## 2015-12-06 MED ORDER — METFORMIN HCL 500 MG PO TABS
500.0000 mg | ORAL_TABLET | Freq: Two times a day (BID) | ORAL | Status: DC
  Administered 2015-12-07 – 2015-12-11 (×9): 500 mg via ORAL
  Filled 2015-12-06 (×9): qty 1

## 2015-12-06 NOTE — ED Notes (Signed)
Pt refused any snacks at this time including water. Pt continues resting.

## 2015-12-06 NOTE — Progress Notes (Signed)
Admission Note: Patient's skin was assessed with MHT [place] present; and found to be warm, dry and intact, clear of any abnormal marks. Patient was searched and no contraband found, no acute distress noted, will continue to monitor.

## 2015-12-06 NOTE — ED Provider Notes (Signed)
[facility] Emergency Department Provider Note  Time seen: 2:44 PM  I have reviewed the triage vital signs and the nursing notes.   HISTORY  Chief Complaint Psychiatric Evaluation    HPI Shari [name] is a 20 y.o. female who presents to the emergency department hearing voices that are telling her to hurt other people. According to the patient she states for the past several days she has been hearing voices. She does not want to tell me what the voices are saying because she is afraid that they're going to hurt people. Patient is very soft spoken in the emergency department. Patient denies any medical concerns. Triage nurse did note a small draining abscess to her left axilla. Patient states she gets these frequently.  Past Medical History:  Diagnosis Date  . Asthma   . Diabetes mellitus without complication ([place])     There are no active problems to display for this patient.   Past Surgical History:  Procedure Laterality Date  . EYE SURGERY    . TONSILLECTOMY      Prior to Admission medications   Medication Sig Start Date End Date Taking? Authorizing Provider  albuterol (PROVENTIL HFA;VENTOLIN HFA) 108 (90 BASE) MCG/ACT inhaler Inhale 2 puffs into the lungs every 6 (six) hours as needed for wheezing or shortness of breath. 07/30/14   Victorino Dike, FNP  albuterol (PROVENTIL) (2.5 MG/3ML) 0.083% nebulizer solution Take 3 mLs (2.5 mg total) by nebulization every 6 (six) hours as needed for wheezing or shortness of breath. 07/30/14   Victorino Dike, FNP  gabapentin (NEURONTIN) 100 MG capsule Take 1 capsule (100 mg total) by mouth 3 (three) times daily. 09/21/15 09/20/16  Victorino Dike, FNP  ibuprofen (ADVIL,MOTRIN) 800 MG tablet Take 1 tablet (800 mg total) by mouth every 8 (eight) hours as needed. 11/20/14   Victorino Dike, FNP  metFORMIN (GLUCOPHAGE) 500 MG tablet Take 500 mg by mouth 2 (two) times daily with a meal.    Historical Provider, MD    No Known  Allergies  No family history on file.  Social History Social History  Substance Use Topics  . Smoking status: Never Smoker  . Smokeless tobacco: Never Used  . Alcohol use No    Review of Systems Constitutional: Negative for fever. Cardiovascular: Negative for chest pain. Respiratory: Negative for shortness of breath. Gastrointestinal: Negative for abdominal pain Neurological: Negative for headache 10-point ROS otherwise negative.  ____________________________________________   PHYSICAL EXAM:  VITAL SIGNS: ED Triage Vitals [12/06/15 1359]  Enc Vitals Group     BP (!) 140/103     Pulse Rate 91     Resp 18     Temp 98.1 F (36.7 C)     Temp Source Oral     SpO2 98 %     Weight 300 lb (136.1 kg)     Height 5\' 8"  (1.727 m)     Head Circumference      Peak Flow      Pain Score      Pain Loc      Pain Edu?      Excl. in [place]?     Constitutional: Alert and oriented. Well appearing and in no distress. Eyes: Normal exam ENT   Head: Normocephalic and atraumatic.   Mouth/Throat: Mucous membranes are moist. Cardiovascular: Normal rate, regular rhythm. No murmur Respiratory: Normal respiratory effort without tachypnea nor retractions. Breath sounds are clear  Gastrointestinal: Soft and nontender. No distention.   Musculoskeletal:  Nontender with normal range of motion in all extremities. Patient has small area of drainage left axilla, no underlying abscess. Appears to be a resolving abscess. Neurologic:  Normal speech and language. No gross focal neurologic deficits Skin:  Skin is warm, dry and intact.  Psychiatric: Very soft spoken, does not make eye contact.  ____________________________________________   INITIAL IMPRESSION / ASSESSMENT AND PLAN / ED COURSE  Pertinent labs & imaging results that were available during my care of the patient were reviewed by me and considered in my medical decision making (see chart for details).  Patient presents emergency  Department with auditory hallucinations. Possibly command hallucinations although the patient will not elaborate. Patient came voluntarily for evaluation. Patient is currently being seen by psychiatry. I do not see any underlying psychiatric conditions in her history. We will continue with medical workup have the patient seen by psychiatry.   Patient's workup is so far nonrevealing, labs are largely within normal limits. Pregnancy test negative. Ethanol/salicylate/acetaminophen levels pending. Urine tox negative.  ____________________________________________   FINAL CLINICAL IMPRESSION(S) / ED DIAGNOSES  Auditory hallucinations.    Harvest Dark, MD 12/06/15 1454

## 2015-12-06 NOTE — ED Notes (Signed)

## 2015-12-06 NOTE — ED Triage Notes (Signed)
Pt comes into the ED via EMS from home, states she lives with her mother and sisters.. Pt states she is hearing voices telling her to hurt people, pt does not want to say what the voices are saying because she is afraid they will hurt her family. Pt is tearful in triage.Marland Kitchen

## 2015-12-06 NOTE — ED Notes (Signed)
BEHAVIORAL HEALTH ROUNDING Patient sleeping: No. Patient alert and oriented: yes Behavior appropriate: Yes.  ; If no, describe:  Nutrition and fluids offered: yes Toileting and hygiene offered: Yes  Sitter present: q15 minute observations and security  monitoring Law enforcement present: Yes  ODS  

## 2015-12-06 NOTE — Consult Note (Signed)
[place] Psychiatry Consult   Reason for Consult:  Consult for 20 year old woman who presents voluntarily complaining of hallucination-like phenomenon and intrusive thoughts of harm Referring Physician:  Paduchowski Patient Identification: Annaliyah Willig MRN:  [id number] Principal Diagnosis: Acute psychosis Diagnosis:   Patient Active Problem List   Diagnosis Date Noted  . 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 Diabetes mellitus without complication ([place]) [O24.2] 12/06/2015  . Obesity [E66.9] 12/06/2015  . Chronic pain [G89.29] 12/06/2015    Total Time spent with patient: 1 hour  Subjective:   Shaakira Borrero is a 20 y.o. female patient admitted with "I don't know what's wrong but I'm not a bad person".  HPI:  Shouldn't interviewed. Chart reviewed including labs. All notes reviewed. Case discussed with TTS and emergency room doctor. 20 year old woman presents voluntarily to the emergency room. Most acutely she has had 2 days of feeling like she is hearing a voice that is telling her to do horrible things. She says it isn't exactly like sound but more like a voice that happens inside of her head but she believes that it is the voice of the devil. It has been telling her that she is supposed to sacrifice someone. She is getting increasingly frightened and has had the thought maybe some of her coworkers were actually the devil in disguise. Patient says this is been really bad the last couple days but it sounds like there have been some hints of it before. She also talks about some long-standing depressive symptoms. Talks about how she feels like she is hopeless and worthless and wishes that she were dead. Feels negative a lot of the time. This is been present for a longer period of time and she is vague in describing the time course of it. She is prescribed citalopram for depression which she has been prescribed for at least a year but she says she is not really compliant with it. She denies  that she has been drinking or using any drugs. She doesn't report any specific new stressor that would obviously account for this degree of anxiety. She says she is having nightmares she sleeps badly at night and has not been eating well. She talks a lot about how she believes that this may indicate that she is a bad person and she has been afraid to tell any of her friends or family about it because they would think badly of her.  Medical history: Overweight and has mild diabetes takes metformin for it. Also has chronic leg pain of unclear etiology for which she has been prescribed Lyrica. She says that she takes it although she doesn't think it makes any difference.  Social history: Not married. Says she is in a relationship with a woman named Tiffany. Doesn't described that as being a particular stress. She lives with her mother her sister and her grandmother and doesn't report any stress regarding any of them. 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  Substance abuse history: Patient says that she stopped drinking a couple months ago 00 she doesn't think it was really a problem before that. She stopped it because she was told she shouldn't drink when she was taking Lyrica. Denies any use of any other drugs. The drug screen and alcohol screen are negative.  Past Psychiatric History: No previous psychiatric hospitalization. She said she has had intrusive thoughts about killing herself in the past and there was a time in the past that she was taking excess doses of medicine in  some vaguely suicidal way but she is not doing that currently. Sounds like she may not of ever seen a psychiatrist but did see someone a year ago that prescribed her citalopram. Doesn't take it very regularly for no particular reason except that she doesn't like medicine. She says she has an uncle who had schizophrenia, a brother with posttraumatic stress disorder and that her mother has had some kind of mental health problem  unspecified.  Risk to Self: Is patient at risk for suicide?: Yes Risk to Others:   Prior Inpatient Therapy:   Prior Outpatient Therapy:    Past Medical History:  Past Medical History:  Diagnosis Date  . Asthma   . Diabetes mellitus without complication [facility])     Past Surgical History:  Procedure Laterality Date  . EYE SURGERY    . TONSILLECTOMY     Family History: No family history on file. Family Psychiatric  History: See note above. Mother Brother and uncle all with some possible mental health problems Social History:  History  Alcohol Use No     History  Drug Use No    Social History   Social History  . Marital status: Single    Spouse name: N/A  . Number of children: N/A  . Years of education: N/A   Social History Main Topics  . Smoking status: Never Smoker  . Smokeless tobacco: Never Used  . Alcohol use No  . Drug use: No  . Sexual activity: Not Asked   Other Topics Concern  . None   Social History Narrative  . None   Additional Social History:    Allergies:  No Known Allergies  Labs:  Results for orders placed or performed during the hospital encounter of 12/06/15 (from the past 48 hour(s))  Comprehensive metabolic panel     Status: Abnormal   Collection Time: 12/06/15  2:03 PM  Result Value Ref Range   Sodium 137 135 - 145 mmol/L   Potassium 3.7 3.5 - 5.1 mmol/L   Chloride 106 101 - 111 mmol/L   CO2 24 22 - 32 mmol/L   Glucose, Bld 102 (H) 65 - 99 mg/dL   BUN 9 6 - 20 mg/dL   Creatinine, Ser 0.58 0.44 - 1.00 mg/dL   Calcium 8.6 (L) 8.9 - 10.3 mg/dL   Total Protein 8.1 6.5 - 8.1 g/dL   Albumin 3.7 3.5 - 5.0 g/dL   AST 19 15 - 41 U/L   ALT 13 (L) 14 - 54 U/L   Alkaline Phosphatase 72 38 - 126 U/L   Total Bilirubin 0.4 0.3 - 1.2 mg/dL   GFR calc non Af Amer >60 >60 mL/min   GFR calc Af Amer >60 >60 mL/min    Comment: (NOTE) The eGFR has been calculated using the CKD EPI equation. This calculation has not been validated in all clinical  situations. eGFR's persistently <60 mL/min signify possible Chronic Kidney Disease.    Anion gap 7 5 - 15  Ethanol     Status: None   Collection Time: 12/06/15  2:03 PM  Result Value Ref Range   Alcohol, Ethyl (B) <5 <5 mg/dL    Comment:        LOWEST DETECTABLE LIMIT FOR SERUM ALCOHOL IS 5 mg/dL FOR MEDICAL PURPOSES ONLY   Salicylate level     Status: None   Collection Time: 12/06/15  2:03 PM  Result Value Ref Range   Salicylate Lvl <3.2 2.8 - 30.0 mg/dL  Acetaminophen level  Status: Abnormal   Collection Time: 12/06/15  2:03 PM  Result Value Ref Range   Acetaminophen (Tylenol), Serum <10 (L) 10 - 30 ug/mL    Comment:        THERAPEUTIC CONCENTRATIONS VARY SIGNIFICANTLY. A RANGE OF 10-30 ug/mL MAY BE AN EFFECTIVE CONCENTRATION FOR MANY PATIENTS. HOWEVER, SOME ARE BEST TREATED AT CONCENTRATIONS OUTSIDE THIS RANGE. ACETAMINOPHEN CONCENTRATIONS >150 ug/mL AT 4 HOURS AFTER INGESTION AND >50 ug/mL AT 12 HOURS AFTER INGESTION ARE OFTEN ASSOCIATED WITH TOXIC REACTIONS.   cbc     Status: Abnormal   Collection Time: 12/06/15  2:03 PM  Result Value Ref Range   WBC 7.7 3.6 - 11.0 K/uL   RBC 4.46 3.80 - 5.20 MIL/uL   Hemoglobin 11.9 (L) 12.0 - 16.0 g/dL   HCT 36.3 35.0 - 47.0 %   MCV 81.4 80.0 - 100.0 fL   MCH 26.6 26.0 - 34.0 pg   MCHC 32.7 32.0 - 36.0 g/dL   RDW 16.8 (H) 11.5 - 14.5 %   Platelets 368 150 - 440 K/uL  Urine Drug Screen, Qualitative     Status: None   Collection Time: 12/06/15  2:03 PM  Result Value Ref Range   Tricyclic, Ur Screen NONE DETECTED NONE DETECTED   Amphetamines, Ur Screen NONE DETECTED NONE DETECTED   MDMA (Ecstasy)Ur Screen NONE DETECTED NONE DETECTED   Cocaine Metabolite,Ur [place] NONE DETECTED NONE DETECTED   Opiate, Ur Screen NONE DETECTED NONE DETECTED   Phencyclidine (PCP) Ur S NONE DETECTED NONE DETECTED   Cannabinoid 50 Ng, Ur [place] NONE DETECTED NONE DETECTED   Barbiturates, Ur Screen NONE DETECTED NONE DETECTED   Benzodiazepine, Ur  Scrn NONE DETECTED NONE DETECTED   Methadone Scn, Ur NONE DETECTED NONE DETECTED    Comment: (NOTE) 425  Tricyclics, urine               Cutoff 1000 ng/mL 200  Amphetamines, urine             Cutoff 1000 ng/mL 300  MDMA (Ecstasy), urine           Cutoff 500 ng/mL 400  Cocaine Metabolite, urine       Cutoff 300 ng/mL 500  Opiate, urine                   Cutoff 300 ng/mL 600  Phencyclidine (PCP), urine      Cutoff 25 ng/mL 700  Cannabinoid, urine              Cutoff 50 ng/mL 800  Barbiturates, urine             Cutoff 200 ng/mL 900  Benzodiazepine, urine           Cutoff 200 ng/mL 1000 Methadone, urine                Cutoff 300 ng/mL 1100 1200 The urine drug screen provides only a preliminary, unconfirmed 1300 analytical test result and should not be used for non-medical 1400 purposes. Clinical consideration and professional judgment should 1500 be applied to any positive drug screen result due to possible 1600 interfering substances. A more specific alternate chemical method 1700 must be used in order to obtain a confirmed analytical result.  1800 Gas chromato graphy / mass spectrometry (GC/MS) is the preferred 1900 confirmatory method.   Pregnancy, urine POC     Status: None   Collection Time: 12/06/15  2:09 PM  Result Value Ref Range   Preg Test, Ur  NEGATIVE NEGATIVE    Comment:        THE SENSITIVITY OF THIS METHODOLOGY IS >24 mIU/mL     No current facility-administered medications for this encounter.    Current Outpatient Prescriptions  Medication Sig Dispense Refill  . albuterol (PROVENTIL HFA;VENTOLIN HFA) 108 (90 BASE) MCG/ACT inhaler Inhale 2 puffs into the lungs every 6 (six) hours as needed for wheezing or shortness of breath. 1 Inhaler 2  . albuterol (PROVENTIL) (2.5 MG/3ML) 0.083% nebulizer solution Take 3 mLs (2.5 mg total) by nebulization every 6 (six) hours as needed for wheezing or shortness of breath. 75 mL 0  . gabapentin (NEURONTIN) 100 MG capsule Take 1  capsule (100 mg total) by mouth 3 (three) times daily. 42 capsule 0  . ibuprofen (ADVIL,MOTRIN) 800 MG tablet Take 1 tablet (800 mg total) by mouth every 8 (eight) hours as needed. 30 tablet 0  . metFORMIN (GLUCOPHAGE) 500 MG tablet Take 500 mg by mouth 2 (two) times daily with a meal.      Musculoskeletal: Strength & Muscle Tone: within normal limits Gait & Station: normal Patient leans: N/A  Psychiatric Specialty Exam: Physical Exam  Nursing note and vitals reviewed. Constitutional: She appears well-developed and well-nourished.  HENT:  Head: Normocephalic and atraumatic.  Eyes: Conjunctivae are normal. Pupils are equal, round, and reactive to light.  Neck: Normal range of motion.  Cardiovascular: Regular rhythm and normal heart sounds.   Respiratory: Effort normal. No respiratory distress.  GI: Soft.  Musculoskeletal: Normal range of motion.  Neurological: She is alert.  Skin: Skin is warm and dry.  Psychiatric: Her mood appears anxious. Her speech is delayed. She is withdrawn. Thought content is paranoid and delusional. She expresses impulsivity. She expresses homicidal and suicidal ideation. She expresses no suicidal plans and no homicidal plans. She exhibits abnormal recent memory and abnormal remote memory.    Review of Systems  Constitutional: Negative.   HENT: Negative.   Eyes: Negative.   Respiratory: Negative.   Cardiovascular: Negative.   Gastrointestinal: Negative.   Musculoskeletal: Positive for joint pain.  Skin: Negative.   Neurological: Negative.   Psychiatric/Behavioral: Positive for depression, hallucinations and suicidal ideas. Negative for substance abuse. The patient is nervous/anxious and has insomnia.     Blood pressure (!) 140/103, pulse 91, temperature 98.1 F (36.7 C), temperature source Oral, resp. rate 18, height 5' 8"  (1.727 m), weight 136.1 kg (300 lb), last menstrual period 11/15/2015, SpO2 98 %.Body mass index is 45.61 kg/m.  General  Appearance: Fairly Groomed  Eye Contact:  Good  Speech:  Slow and Very quiet. She often whispers and at times has to whisper things into peoples ear when she looks very nervous.  Volume:  Decreased  Mood:  Anxious and Dysphoric  Affect:  Constricted  Thought Process:  Disorganized  Orientation:  Full (Time, Place, and Person)  Thought Content:  Illogical, Delusions and Paranoid Ideation  Suicidal Thoughts:  Yes.  without intent/plan  Homicidal Thoughts:  Yes.  without intent/plan  Memory:  Immediate;   Good Recent;   Fair Remote;   Fair  Judgement:  Impaired  Insight:  Shallow  Psychomotor Activity:  Decreased  Concentration:  Concentration: Poor  Recall:  [employer] of Knowledge:  Fair  Language:  Fair  Akathisia:  No  Handed:  Right  AIMS (if indicated):     Assets:  Communication Skills Desire for Improvement Financial Resources/Insurance Housing Physical Health Resilience Social Support  ADL's:  Intact  Cognition:  WNL  Sleep:        Treatment Plan Summary: Daily contact with patient to assess and evaluate symptoms and progress in treatment, Medication management and Plan This is a 21 year old woman who is presenting with acute or subacute psychotic symptoms. Intrusive thoughts that she is interpreting as being the voice of the devil. These have content that includes the implication that she is going to hurt or kill someone. Also lots of depressive symptoms and negativity and some vaguely suicidal thinking. Not currently seeing a mental health professional. No evidence that she is abusing drugs or alcohol. Patient needs admission to the psychiatric ward for safe evaluation and treatment. She is not currently under involuntary commitment and she is agreeing voluntarily to sign in to the psychiatry ward. Case reviewed with TTS and ER physician. I am going to go ahead and put in admission orders so that she can get down stairs and they can get to work on doing a full  evaluation. Differential diagnosis obviously include schizophreniform disorder, psychotic depression, brief psychosis.  Disposition: Recommend psychiatric Inpatient admission when medically cleared. Supportive therapy provided about ongoing stressors.  Alethia Berthold, MD 12/06/2015 3:10 PM

## 2015-12-06 NOTE — BH Assessment (Signed)
Assessment Note  Shari [name] is an 20 y.o. female who presents to the ER because she start hearing voices. She states this has happened in the past approximately a month ago. The voices are telling her to hurt herself and other people. Patient did not want to tell this writer what they were saying because she was afraid they will get upset with her.   During the interview, it was apparent the patient was responding to internal stimuli. Several different times, she asked this writer to repeat his questions. When writer asked her if she was having a hard time understanding the questions or was the voices talking at the same time, she stated the "the second one."  During the interview, she was calm, cooperative and polite. She stated, she was more afraid of what the voices was going to do to her family, than anything else. She denies the use of any mind-altering substances, in the past and currently. She also denies any involvement with the legal system. She recently graduated from [employer] with an associate degree in cosmetology.   Patient states she is afraid to be alone. Her sleep has decreased. She is getting approximately three hours a sleep a night. She's having trouble falling and staying asleep. She also reports of having "bad nightmares" and that is another reason why she's unable to sleep. Her appetite has decreased and she's eating only one meal a day, "even if that."   Diagnosis: Acute Psychosis.  Past Medical History:  Past Medical History:  Diagnosis Date  . Asthma   . Diabetes mellitus without complication [facility])     Past Surgical History:  Procedure Laterality Date  . EYE SURGERY    . TONSILLECTOMY      Family History: No family history on file.  Social History:  reports that she has never smoked. She has never used smokeless tobacco. She reports that she does not drink alcohol or use drugs.  Additional Social History:  Alcohol / Drug Use Pain Medications: See  PTA Prescriptions: See PTA Over the Counter: See PTA History of alcohol / drug use?: No history of alcohol / drug abuse Longest period of sobriety (when/how long): Reports of none Negative Consequences of Use:  (Reports of none) Withdrawal Symptoms:  (Reports of none)  CIWA: CIWA-Ar BP: (!) 140/103 Pulse Rate: 91 COWS:    Allergies: No Known Allergies  Home Medications:  (Not in a hospital admission)  OB/GYN Status:  Patient's last menstrual period was 11/15/2015 (approximate).  General Assessment Data Location of Assessment: [facility] ED TTS Assessment: In system Is this a Tele or Face-to-Face Assessment?: Face-to-Face Is this an Initial Assessment or a Re-assessment for this encounter?: Initial Assessment Marital status: Single Maiden name: n/a Is patient pregnant?: No Pregnancy Status: No Living Arrangements: Other relatives Can pt return to current living arrangement?: Yes Admission Status: Voluntary Is patient capable of signing voluntary admission?: Yes Referral Source: Self/Family/Friend Insurance type: Insurance risk surveyor Exam ([place]) Medical Exam completed: Yes  Crisis Care Plan Living Arrangements: Other relatives Legal Guardian: Other: (Reports of none) Name of Psychiatrist: Reports of none Name of Therapist: Reports of none  Education Status Is patient currently in school?: No Current Grade: n/a Highest grade of school patient has completed: Associate Degree  Name of school: [employer] person: n/a  Risk to self with the past 6 months Suicidal Ideation: No Has patient been a risk to self within the past 6 months prior to admission? :  No Suicidal Intent: No Has patient had any suicidal intent within the past 6 months prior to admission? : No Is patient at risk for suicide?: No Suicidal Plan?: No Has patient had any suicidal plan within the past 6 months prior to admission? : No Access to Means: No What has been  your use of drugs/alcohol within the last 12 months?: Reports of none Previous Attempts/Gestures: No How many times?: 0 Other Self Harm Risks: Reports of none Triggers for Past Attempts: None known Intentional Self Injurious Behavior: None Family Suicide History: Unknown Recent stressful life event(s): Other (Comment) (Recent onset of A/H) Persecutory voices/beliefs?: Yes Depression: Yes Depression Symptoms: Feeling worthless/self pity, Loss of interest in usual pleasures, Guilt, Fatigue, Isolating, Tearfulness, Insomnia Substance abuse history and/or treatment for substance abuse?: No Suicide prevention information given to non-admitted patients: Not applicable  Risk to Others within the past 6 months Homicidal Ideation: No Does patient have any lifetime risk of violence toward others beyond the six months prior to admission? : No Thoughts of Harm to Others: No Current Homicidal Intent: No Current Homicidal Plan: No Access to Homicidal Means: No Identified Victim: Reports of none History of harm to others?: No Assessment of Violence: None Noted Violent Behavior Description: Reports of none Does patient have access to weapons?: No Criminal Charges Pending?: No Does patient have a court date: No Is patient on probation?: No  Psychosis Hallucinations: Auditory Delusions: None noted  Mental Status Report Appearance/Hygiene: Unremarkable, In scrubs Eye Contact: Fair Motor Activity: Freedom of movement, Unremarkable Speech: Logical/coherent, Unremarkable Level of Consciousness: Alert Mood: Anxious, Depressed, Guilty, Sad, Helpless, Pleasant Affect: Appropriate to circumstance, Anxious, Sad Anxiety Level: Moderate Thought Processes: Coherent, Relevant Judgement: Partial Orientation: Person, Place, Time, Situation, Appropriate for developmental age Obsessive Compulsive Thoughts/Behaviors: Minimal  Cognitive Functioning Concentration: Decreased Memory: Recent Intact, Remote  Intact IQ: Average Insight: Fair Impulse Control: Fair Appetite: Poor Weight Loss: 0 Weight Gain: 0 Sleep: Decreased Total Hours of Sleep: 3 Vegetative Symptoms: None  ADLScreening [facility] Assessment Services) Patient's cognitive ability adequate to safely complete daily activities?: Yes Patient able to express need for assistance with ADLs?: Yes Independently performs ADLs?: Yes (appropriate for developmental age)  Prior Inpatient Therapy Prior Inpatient Therapy: No Prior Therapy Dates: Reports of none Prior Therapy Facilty/Provider(s): Reports of none Reason for Treatment: Reports of none  Prior Outpatient Therapy Prior Outpatient Therapy: No Prior Therapy Dates: Reports of none Prior Therapy Facilty/Provider(s): Reports of none Reason for Treatment: Reports of none Does patient have an ACCT team?: No Does patient have Intensive In-House Services?  : No Does patient have [facility] services? : No Does patient have P4CC services?: No  ADL Screening (condition at time of admission) Patient's cognitive ability adequate to safely complete daily activities?: Yes Is the patient deaf or have difficulty hearing?: No Does the patient have difficulty seeing, even when wearing glasses/contacts?: No Does the patient have difficulty concentrating, remembering, or making decisions?: No Patient able to express need for assistance with ADLs?: Yes Does the patient have difficulty dressing or bathing?: No Independently performs ADLs?: Yes (appropriate for developmental age) Does the patient have difficulty walking or climbing stairs?: No Weakness of Legs: None Weakness of Arms/Hands: None  Home Assistive Devices/Equipment Home Assistive Devices/Equipment: None  Therapy Consults (therapy consults require a physician order) PT Evaluation Needed: No OT Evalulation Needed: No SLP Evaluation Needed: No Abuse/Neglect Assessment (Assessment to be complete while patient is alone) Physical  Abuse: Denies Verbal Abuse: Yes, past (Comment) Sexual Abuse: Denies  Exploitation of patient/patient's resources: Denies Self-Neglect: Denies Values / Beliefs Cultural Requests During Hospitalization: None Spiritual Requests During Hospitalization: None Consults Spiritual Care Consult Needed: No Social Work Consult Needed: No Regulatory affairs officer (For Healthcare) Does patient have an advance directive?: No Would patient like information on creating an advanced directive?: No - patient declined information    Additional Information 1:1 In Past 12 Months?: No CIRT Risk: No Elopement Risk: No Does patient have medical clearance?: Yes  Child/Adolescent Assessment Running Away Risk: Denies (Patient is an adult)  Disposition:  Disposition Initial Assessment Completed for this Encounter: Yes Disposition of Patient: Other dispositions (ER MD ordered Psych Consult)  On Site Evaluation by:   Reviewed with Physician:    Gunnar Fusi MS, LCAS, LPC, [place], CCSI Therapeutic Triage Specialist 12/06/2015 5:22 PM

## 2015-12-06 NOTE — ED Notes (Signed)
Called to give report, Behavior unit not ready for report, requested 45 minute wait for report.

## 2015-12-06 NOTE — BH Assessment (Signed)
Patient is to be admitted to [facility] by Dr. Weber Cooks.  Attending Physician will be Dr. Bary Leriche.   Patient has been assigned to room 312, by [place]   Intake Paper Work has been signed and placed on patient chart.  ER staff is aware of the admission Holley Raring, ER Sect.; Dr. Corky Downs, ER MD; Gwynn Burly., Patient's Nurse & Verdis Frederickson, Patient Access).

## 2015-12-07 DIAGNOSIS — F29 Unspecified psychosis not due to a substance or known physiological condition: Secondary | ICD-10-CM

## 2015-12-07 DIAGNOSIS — J45909 Unspecified asthma, uncomplicated: Secondary | ICD-10-CM | POA: Diagnosis present

## 2015-12-07 LAB — LIPID PANEL
CHOL/HDL RATIO: 7.8 ratio
Cholesterol: 321 mg/dL — ABNORMAL HIGH (ref 0–200)
HDL: 41 mg/dL (ref >40)
LDL Cholesterol: 266 mg/dL — ABNORMAL HIGH (ref 0–99)
Triglycerides: 71 mg/dL (ref <150)
VLDL: 14 mg/dL (ref 0–40)

## 2015-12-07 LAB — GLUCOSE, CAPILLARY
GLUCOSE-CAPILLARY: 97 mg/dL (ref 65–99)
Glucose-Capillary: 95 mg/dL (ref 65–99)

## 2015-12-07 LAB — TSH: TSH: 1.265 u[IU]/mL (ref 0.350–4.500)

## 2015-12-07 MED ORDER — PNEUMOCOCCAL VAC POLYVALENT 25 MCG/0.5ML IJ INJ
0.5000 mL | INJECTION | INTRAMUSCULAR | Status: AC
  Administered 2015-12-08: 0.5 mL via INTRAMUSCULAR
  Filled 2015-12-07: qty 0.5

## 2015-12-07 MED ORDER — PALIPERIDONE ER 3 MG PO TB24
3.0000 mg | ORAL_TABLET | Freq: Every day | ORAL | Status: DC
  Administered 2015-12-07 – 2015-12-08 (×2): 3 mg via ORAL
  Filled 2015-12-07 (×2): qty 1
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
## 2015-12-07 NOTE — Tx Team (Signed)
Interdisciplinary Treatment and Diagnostic Plan Update  12/07/2015 Time of Session: 10:30 Shari [name] MRN: [id number]  Principal Diagnosis: Schizophrenia spectrum disorder with psychotic disorder type not yet determined  Secondary Diagnoses: Principal Problem:   Schizophrenia spectrum disorder with psychotic disorder type not yet determined Active Problems:   Diabetes mellitus without complication ([place])   Obesity   Asthma   Current Medications:  Current Facility-Administered Medications  Medication Dose Route Frequency Provider Last Rate Last Dose  . acetaminophen (TYLENOL) tablet 650 mg  650 mg Oral Q6H PRN Gonzella Lex, MD   650 mg at 12/07/15 1030  . albuterol (PROVENTIL HFA;VENTOLIN HFA) 108 (90 Base) MCG/ACT inhaler 2 puff  2 puff Inhalation Q4H PRN Gonzella Lex, MD      . alum & mag hydroxide-simeth (MAALOX/MYLANTA) 200-200-20 MG/5ML suspension 30 mL  30 mL Oral Q4H PRN Gonzella Lex, MD      . ARIPiprazole (ABILIFY) tablet 10 mg  10 mg Oral QHS Gonzella Lex, MD      . benztropine (COGENTIN) tablet 1 mg  1 mg Oral BID PRN Gonzella Lex, MD      . hydrOXYzine (ATARAX/VISTARIL) tablet 25 mg  25 mg Oral TID PRN Gonzella Lex, MD      . magnesium hydroxide (MILK OF MAGNESIA) suspension 30 mL  30 mL Oral Daily PRN Gonzella Lex, MD      . metFORMIN (GLUCOPHAGE) tablet 500 mg  500 mg Oral BID WC Gonzella Lex, MD   500 mg at 12/07/15 0846  . [START ON 12/08/2015] pneumococcal 23 valent vaccine (PNU-IMMUNE) injection 0.5 mL  0.5 mL Intramuscular Tomorrow-1000 Jolanta B Pucilowska, MD      . traZODone (DESYREL) tablet 100 mg  100 mg Oral QHS PRN Gonzella Lex, MD       PTA Medications: Prescriptions Prior to Admission  Medication Sig Dispense Refill Last Dose  . albuterol (PROVENTIL HFA;VENTOLIN HFA) 108 (90 BASE) MCG/ACT inhaler Inhale 2 puffs into the lungs every 6 (six) hours as needed for wheezing or shortness of breath. 1 Inhaler 2   . albuterol (PROVENTIL) (2.5  MG/3ML) 0.083% nebulizer solution Take 3 mLs (2.5 mg total) by nebulization every 6 (six) hours as needed for wheezing or shortness of breath. 75 mL 0   . gabapentin (NEURONTIN) 100 MG capsule Take 1 capsule (100 mg total) by mouth 3 (three) times daily. 42 capsule 0   . ibuprofen (ADVIL,MOTRIN) 800 MG tablet Take 1 tablet (800 mg total) by mouth every 8 (eight) hours as needed. 30 tablet 0   . metFORMIN (GLUCOPHAGE) 500 MG tablet Take 500 mg by mouth 2 (two) times daily with a meal.       Patient Stressors: Other: new onset auditory hallucinations  Patient Strengths: Ability for insight Average or above average intelligence Communication skills  Treatment Modalities: Medication Management, Group therapy, Case management,  1 to 1 session with clinician, Psychoeducation, Recreational therapy.   Physician Treatment Plan for Primary Diagnosis: Schizophrenia spectrum disorder with psychotic disorder type not yet determined Long Term Goal(s): Improvement in symptoms so as ready for discharge NA   Short Term Goals: Ability to identify changes in lifestyle to reduce recurrence of condition will improve Ability to verbalize feelings will improve Ability to disclose and discuss suicidal ideas Ability to demonstrate self-control will improve Ability to identify and develop effective coping behaviors will improve Ability to maintain clinical measurements within normal limits will improve Compliance with prescribed medications will improve  NA  Medication Management: Evaluate patient's response, side effects, and tolerance of medication regimen.  Therapeutic Interventions: 1 to 1 sessions, Unit Group sessions and Medication administration.  Evaluation of Outcomes: Progressing  Physician Treatment Plan for Secondary Diagnosis: Principal Problem:   Schizophrenia spectrum disorder with psychotic disorder type not yet determined Active Problems:   Diabetes mellitus without complication ([place])    Obesity   Asthma  Long Term Goal(s): Improvement in symptoms so as ready for discharge NA   Short Term Goals: Ability to identify changes in lifestyle to reduce recurrence of condition will improve Ability to verbalize feelings will improve Ability to disclose and discuss suicidal ideas Ability to demonstrate self-control will improve Ability to identify and develop effective coping behaviors will improve Ability to maintain clinical measurements within normal limits will improve Compliance with prescribed medications will improve NA     Medication Management: Evaluate patient's response, side effects, and tolerance of medication regimen.  Therapeutic Interventions: 1 to 1 sessions, Unit Group sessions and Medication administration.  Evaluation of Outcomes: Progressing   RN Treatment Plan for Primary Diagnosis: Schizophrenia spectrum disorder with psychotic disorder type not yet determined Long Term Goal(s): Knowledge of disease and therapeutic regimen to maintain health will improve  Short Term Goals: Ability to disclose and discuss suicidal ideas, Ability to identify and develop effective coping behaviors will improve and Compliance with prescribed medications will improve  Medication Management: RN will administer medications as ordered by provider, will assess and evaluate patient's response and provide education to patient for prescribed medication. RN will report any adverse and/or side effects to prescribing provider.  Therapeutic Interventions: 1 on 1 counseling sessions, Psychoeducation, Medication administration, Evaluate responses to treatment, Monitor vital signs and CBGs as ordered, Perform/monitor CIWA, COWS, AIMS and Fall Risk screenings as ordered, Perform wound care treatments as ordered.  Evaluation of Outcomes: Progressing   LCSW Treatment Plan for Primary Diagnosis: Schizophrenia spectrum disorder with psychotic disorder type not yet determined Long Term  Goal(s): Safe transition to appropriate next level of care at discharge, Engage patient in therapeutic group addressing interpersonal concerns.  Short Term Goals: Engage patient in aftercare planning with referrals and resources, Increase social support and Increase ability to appropriately verbalize feelings  Therapeutic Interventions: Assess for all discharge needs, 1 to 1 time with Social worker, Explore available resources and support systems, Assess for adequacy in community support network, Educate family and significant other(s) on suicide prevention, Complete Psychosocial Assessment, Interpersonal group therapy.  Evaluation of Outcomes: Progressing   Progress in Treatment: Attending groups: No. CSW still assessing, pt new to milieu Participating in groups: No. CSW still assessing, pt new to milieu Taking medication as prescribed: Yes. Toleration medication: Yes. Family/Significant other contact made: No, will contact:  pt's family Patient understands diagnosis: Yes. Discussing patient identified problems/goals with staff: Yes. Medical problems stabilized or resolved: Yes. Denies suicidal/homicidal ideation: Yes. Issues/concerns per patient self-inventory: No. Other: none reported  New problem(s) identified: No, Describe:  none reported  New Short Term/Long Term Goal(s):  Discharge Plan or Barriers:   Reason for Continuation of Hospitalization: Anxiety Depression Hallucinations Suicidal ideation  Estimated Length of Stay: 3-5 days  Attendees: Patient: Shari [name] 12/07/2015 10:51 AM  Physician: Dr. Bary Leriche, MD 12/07/2015 10:51 AM  Nursing: Carolynn Sayers, RN 12/07/2015 10:51 AM  RN Care Manager:  12/07/2015 10:51 AM  Social Worker: Alphonse Guild. Daine Gravel, LCAS 12/07/2015 10:51 AM  Recreational Therapist: Everitt Amber, LRT 12/07/2015 10:51 AM  Other:  12/07/2015 10:51 AM  Other:  12/07/2015 10:51 AM  Other: 12/07/2015 10:51 AM    Scribe for Treatment  Team: Alphonse Guild Quinlynn Cuthbert, LCSWA 12/07/2015

## 2015-12-07 NOTE — Plan of Care (Signed)
Problem: Pain Managment: Goal: General experience of comfort will improve Outcome: Progressing Patient having some cramps, improved with medications

## 2015-12-07 NOTE — Tx Team (Signed)
Initial Treatment Plan 12/07/2015 5:52 AM [name] [id number]    PATIENT STRESSORS: Other: new onset auditory hallucinations   PATIENT STRENGTHS: Ability for insight Average or above average intelligence Communication skills   PATIENT IDENTIFIED PROBLEMS: Auditory hallucinations  Passive SI  " Want to figure out what's going on."                  DISCHARGE CRITERIA:  Ability to meet basic life and health needs Improved stabilization in mood, thinking, and/or behavior Motivation to continue treatment in a less acute level of care  PRELIMINARY DISCHARGE PLAN: Outpatient therapy Return to previous living arrangement  PATIENT/FAMILY INVOLVEMENT: This treatment plan has been presented to and reviewed with the patient, Shari [name].  The patient and family have been given the opportunity to ask questions and make suggestions.  Caryl Pina, RN 12/07/2015, 5:52 AM
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
## 2015-12-07 NOTE — Progress Notes (Signed)
Patient presents with flat affect, but brightens on approach. Denies SI, HI, but endorses AVH. Pt unable to report what she was seeing other than spots. Pt request pain medication for menstrual cramps with good relief. Medication and group compliant. Appropriate with staff and peers.  Encouragement and support offered. Pt receptive and remains safe on unit with q 15 min checks.
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
## 2015-12-07 NOTE — Progress Notes (Signed)
D: Pt felt "hot and light headed" this morning. Pt sat down and threw up a clear liquid. Vomitus did not contain any particles.  A: Pt was helped to the wheelchair. Pt escorted to the room. Pt vital signs re-checked R: Pt denied any further nausea. Pt in bed.

## 2015-12-07 NOTE — Plan of Care (Signed)
Problem: Self-Concept: Goal: Ability to disclose and discuss suicidal ideas will improve Outcome: Progressing Pt endorsed passive SI without a plan.

## 2015-12-07 NOTE — Progress Notes (Signed)
D: Patient currently in the visitation room. Visiting with her family members. Alert and oriented. Reports that she continues to hear voices, occasionally, voices telling her to hurt people. Reports that GI has improved. Pleasant and cooperative. A: Patient was encouraged to talk to staff as needed. Therapeutic milieu promoted. Safety precautions reinforced. R: Remains safe on the unit, no sign of discomfort.

## 2015-12-07 NOTE — BHH Group Notes (Signed)
[place] LCSW Group Therapy  12/07/2015 2:29 PM  Type of Therapy:  Group Therapy  Participation Level:  Active  Participation Quality:  Appropriate  Affect:  Appropriate  Cognitive:  Alert  Insight:  Engaged  Engagement in Therapy:  Engaged  Modes of Intervention:  Activity, Discussion, Education and Support  Summary of Progress/Problems:Balance in life: Patients will discuss the concept of balance and how it looks and feels to be unbalanced. Pt will identify areas in their life that is unbalanced and ways to become more balanced. Patient stated she wants to work on her sleeping habits.     Arlette Schaad G. [place], [place] 12/07/2015, 2:30 PM

## 2015-12-07 NOTE — Progress Notes (Signed)
Recreation Therapy Notes  Date: 11.09.17 Time: 9:30 am Location: Craft Room  Group Topic: Leisure Education  Goal Area(s) Addresses:  Patient will identify activities for each letter of the alphabet. Patient will verbalize ability to integrate positive leisure into life post d/c. Patient will verbalize ability to use leisure as a Technical sales engineer.  Behavioral Response: Attentive, Interactive  Intervention: Leisure Alphabet  Activity: Patients were given a Leisure Air traffic controller and were instructed to write healthy leisure activities for each letter of the alphabet.  Education: LRT educated patients on what they need to participate in leisure.  Education Outcome: Acknowledges education/In group clarification offered  Clinical Observations/Feedback: Patient wrote healthy leisure activities. Patient contributed to group discussion by stating healthy leisure activities, and what she needs to participate in leisure.  Leonette Monarch, LRT/CTRS 12/07/2015 10:34 AM

## 2015-12-07 NOTE — H&P (Addendum)
Psychiatric Admission Assessment Adult  Patient Identification: Shari [name] MRN:  [id number] Date of Evaluation:  12/07/2015 Chief Complaint:  Pyschosis Principal Diagnosis: Schizophrenia spectrum disorder with psychotic disorder type not yet determined Diagnosis:   Patient Active Problem List   Diagnosis Date Noted  . Asthma [J45.909] 12/07/2015  . Diabetes mellitus without complication ([place]) A999333 12/06/2015  . Obesity [E66.9] 12/06/2015  . Chronic pain [G89.29] 12/06/2015  . Schizophrenia spectrum disorder with psychotic disorder type not yet determined [F29] 12/06/2015   History of Present Illness:   Identifying data. [name] is a 20 year old female with a history of depression.  Chief complaint. "I don't want anything bad to happen to you."  History of present illness. Information was obtained from the patient and the chart. The patient has a history of depression and has been treated with Celexa for the past year or so. For the past 3 days the patient started experiencing auditory hallucinations that are command in nature. She is not forthcoming about the context of her voices as she fears that he may hurt her family and even [place] in the hospital. Apparently in the past several weeks she was suspicious of her coworker thinking that this person may be a devil in disguise. She is paranoid and very frightened. She denies current symptoms of depression but admits that in the past she had intrusive thoughts of suicide and most likely to overdose of medication at some point. She was not hospitalized for that. She does not report heightened anxiety with her first panic attack on the day of admission but no symptoms suggestive of PTSD or OCD. She does not drink or use alcohol. Yesterday she was started on Abilify for psychosis but could not tolerate it due to vomiting. She was weak and diaphoretic and has to be assisted to her room.  Past psychiatric history. She was prescribed Celexa for  depression in the past year or so but was not consistently taking it. She did not feel it was making a difference. She denies previous hospitalizations. In the past she was drinking a little but never thought this was a problem. She stopped drinking when she was prescribed medications.  Family psychiatric history. Mother with mental illness on medications, rather with PTSD, uncle with schizophrenia.   Social history. She is single. She is in a lesbian relationship. She lives with her family. 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 She likes her job but is very anxious talking to her clients. She has high anxiety before going to work.   Total Time spent with patient: 1 hour  Is the patient at risk to self? Yes.    Has the patient been a risk to self in the past 6 months? Yes.    Has the patient been a risk to self within the distant past? No.  Is the patient a risk to others? No.  Has the patient been a risk to others in the past 6 months? No.  Has the patient been a risk to others within the distant past? No.   Prior Inpatient Therapy:   Prior Outpatient Therapy:    Alcohol Screening: 1. How often do you have a drink containing alcohol?: Monthly or less 2. How many drinks containing alcohol do you have on a typical day when you are drinking?: 1 or 2 3. How often do you have six or more drinks on one occasion?: Never Preliminary Score: 0 9. Have you or someone else been injured as a  result of your drinking?: No 10. Has a relative or friend or a doctor or another health worker been concerned about your drinking or suggested you cut down?: No Alcohol Use Disorder Identification Test Final Score (AUDIT): 1 Brief Intervention: AUDIT score less than 7 or less-screening does not suggest unhealthy drinking-brief intervention not indicated Substance Abuse History in the last 12 months:  No. Consequences of Substance Abuse: NA Previous Psychotropic Medications: Yes   Psychological Evaluations: No  Past Medical History:  Past Medical History:  Diagnosis Date  . Asthma   . Diabetes mellitus without complication [facility])     Past Surgical History:  Procedure Laterality Date  . EYE SURGERY    . TONSILLECTOMY     Family History: History reviewed. No pertinent family history.  Tobacco Screening: Have you used any form of tobacco in the last 30 days? (Cigarettes, Smokeless Tobacco, Cigars, and/or Pipes): No Social History:  History  Alcohol Use No     History  Drug Use No    Additional Social History:                           Allergies:  No Known Allergies Lab Results:  Results for orders placed or performed during the hospital encounter of 12/06/15 (from the past 48 hour(s))  Lipid panel     Status: Abnormal   Collection Time: 12/07/15  6:52 AM  Result Value Ref Range   Cholesterol 321 (H) 0 - 200 mg/dL   Triglycerides 71 <150 mg/dL   HDL 41 >40 mg/dL   Total CHOL/HDL Ratio 7.8 RATIO   VLDL 14 0 - 40 mg/dL   LDL Cholesterol 266 (H) 0 - 99 mg/dL    Comment:        Total Cholesterol/HDL:CHD Risk Coronary Heart Disease Risk Table                     Men   Women  1/2 Average Risk   3.4   3.3  Average Risk       5.0   4.4  2 X Average Risk   9.6   7.1  3 X Average Risk  23.4   11.0        Use the calculated Patient Ratio above and the CHD Risk Table to determine the patient's CHD Risk.        ATP III CLASSIFICATION (LDL):  <100     mg/dL   Optimal  100-129  mg/dL   Near or Above                    Optimal  130-159  mg/dL   Borderline  160-189  mg/dL   High  >190     mg/dL   Very High   TSH     Status: None   Collection Time: 12/07/15  6:52 AM  Result Value Ref Range   TSH 1.265 0.350 - 4.500 uIU/mL    Comment: Performed by a 3rd Generation assay with a functional sensitivity of <=0.01 uIU/mL.    Blood Alcohol level:  Lab Results  Component Value Date   ETH <5 A999333    Metabolic Disorder Labs:  No  results found for: HGBA1C, MPG No results found for: PROLACTIN Lab Results  Component Value Date   CHOL 321 (H) 12/07/2015   TRIG 71 12/07/2015   HDL 41 12/07/2015   CHOLHDL 7.8 12/07/2015   VLDL 14 12/07/2015  LDLCALC 266 (H) 12/07/2015    Current Medications: Current Facility-Administered Medications  Medication Dose Route Frequency Provider Last Rate Last Dose  . acetaminophen (TYLENOL) tablet 650 mg  650 mg Oral Q6H PRN Gonzella Lex, MD   650 mg at 12/07/15 1030  . albuterol (PROVENTIL HFA;VENTOLIN HFA) 108 (90 Base) MCG/ACT inhaler 2 puff  2 puff Inhalation Q4H PRN Gonzella Lex, MD      . alum & mag hydroxide-simeth (MAALOX/MYLANTA) 200-200-20 MG/5ML suspension 30 mL  30 mL Oral Q4H PRN Gonzella Lex, MD      . benztropine (COGENTIN) tablet 1 mg  1 mg Oral BID PRN Gonzella Lex, MD      . hydrOXYzine (ATARAX/VISTARIL) tablet 25 mg  25 mg Oral TID PRN Gonzella Lex, MD      . magnesium hydroxide (MILK OF MAGNESIA) suspension 30 mL  30 mL Oral Daily PRN Gonzella Lex, MD      . metFORMIN (GLUCOPHAGE) tablet 500 mg  500 mg Oral BID WC Gonzella Lex, MD   500 mg at 12/07/15 0846  . paliperidone (INVEGA) 24 hr tablet 3 mg  3 mg Oral Daily Jezabella Schriever B Dawnn Nam, MD      . Derrill Memo ON 12/08/2015] pneumococcal 23 valent vaccine (PNU-IMMUNE) injection 0.5 mL  0.5 mL Intramuscular Tomorrow-1000 Shelagh Rayman B Donique Hammonds, MD      . traZODone (DESYREL) tablet 100 mg  100 mg Oral QHS PRN Gonzella Lex, MD       PTA Medications: Prescriptions Prior to Admission  Medication Sig Dispense Refill Last Dose  . albuterol (PROVENTIL HFA;VENTOLIN HFA) 108 (90 BASE) MCG/ACT inhaler Inhale 2 puffs into the lungs every 6 (six) hours as needed for wheezing or shortness of breath. 1 Inhaler 2   . albuterol (PROVENTIL) (2.5 MG/3ML) 0.083% nebulizer solution Take 3 mLs (2.5 mg total) by nebulization every 6 (six) hours as needed for wheezing or shortness of breath. 75 mL 0   . gabapentin (NEURONTIN) 100 MG  capsule Take 1 capsule (100 mg total) by mouth 3 (three) times daily. 42 capsule 0   . ibuprofen (ADVIL,MOTRIN) 800 MG tablet Take 1 tablet (800 mg total) by mouth every 8 (eight) hours as needed. 30 tablet 0   . metFORMIN (GLUCOPHAGE) 500 MG tablet Take 500 mg by mouth 2 (two) times daily with a meal.       Musculoskeletal: Strength & Muscle Tone: within normal limits Gait & Station: normal Patient leans: N/A  Psychiatric Specialty Exam: I reviewed physical exam performed in the emergency room and agree with the findings. Physical Exam  Nursing note and vitals reviewed.   Review of Systems  Constitutional: Positive for diaphoresis.  Gastrointestinal: Positive for vomiting.  Neurological: Positive for weakness.  Psychiatric/Behavioral: Positive for depression and hallucinations.    Blood pressure 103/60, pulse 75, temperature 98.6 F (37 C), temperature source Oral, resp. rate 18, height 5\' 8"  (1.727 m), weight 136.1 kg (300 lb), last menstrual period 11/15/2015, SpO2 100 %.Body mass index is 45.61 kg/m.  See SRA.                                                  Sleep:  Number of Hours: 5.75    Treatment Plan Summary: Daily contact with patient to assess and evaluate symptoms and progress in treatment and  Medication management   Ms. Wamboldt is a 20 year old female with a history of depression admitted for new onset psychosis.  1. Psychosis. She was started on Abilify but could not tolerate it due to vomitting. Will switch to [place].  2. Anxiety. Vistaril is available.  3. Insomnia. Trazodone is available.  4. Diabetes. She is on metformin, ADA diet, blood glucose monitoring.   5. Metabolic syndrome monitoring. Lipid panel, TSH and hemoglobin A1c are pending.  6. EKG. Pending.  7. Disposition. She will be discharge home with family. She will follow up with [facility].   Observation Level/Precautions:  15 minute checks  Laboratory:  CBC Chemistry  Profile UDS UA  Psychotherapy:    Medications:    Consultations:    Discharge Concerns:    Estimated LOS:  Other:     Physician Treatment Plan for Primary Diagnosis: Schizophrenia spectrum disorder with psychotic disorder type not yet determined Long Term Goal(s): Improvement in symptoms so as ready for discharge  Short Term Goals: Ability to identify changes in lifestyle to reduce recurrence of condition will improve, Ability to verbalize feelings will improve, Ability to disclose and discuss suicidal ideas, Ability to demonstrate self-control will improve, Ability to identify and develop effective coping behaviors will improve, Ability to maintain clinical measurements within normal limits will improve and Compliance with prescribed medications will improve  Physician Treatment Plan for Secondary Diagnosis: Principal Problem:   Schizophrenia spectrum disorder with psychotic disorder type not yet determined Active Problems:   Diabetes mellitus without complication ([place])   Obesity   Asthma  Long Term Goal(s): NA  Short Term Goals: NA  I certify that inpatient services furnished can reasonably be expected to improve the patient's condition.    Orson Slick, MD 11/9/201712:43 PM

## 2015-12-07 NOTE — [facility] Group Notes (Signed)
[place] LCSW Group Therapy Note  Type of Therapy and Topic:  Group Therapy:  Goals Group: SMART Goals  Participation Level:  Patient attended group on this date and minimally participated in the group discussion.   Description of Group:   The purpose of a daily goals group is to assist and guide patients in setting recovery/wellness-related goals.  The objective is to set goals as they relate to the crisis in which they were admitted. Patients will be using SMART goal modalities to set measurable goals.  Characteristics of realistic goals will be discussed and patients will be assisted in setting and processing how one will reach their goal. Facilitator will also assist patients in applying interventions and coping skills learned in psycho-education groups to the SMART goal and process how one will achieve defined goal.  Therapeutic Goals: -Patients will develop and document one goal related to or their crisis in which brought them into treatment. -Patients will be guided by LCSW using SMART goal setting modality in how to set a measurable, attainable, realistic and time sensitive goal.  -Patients will process barriers in reaching goal. -Patients will process interventions in how to overcome and successful in reaching goal.   Summary of Patient Progress:  Patient Goal: "I want to be able to get out of the bed daily". Patient shared that due to her depression this has been a challenge for her. Patient states that she wants to take one day at a time and work on her depression.    Therapeutic Modalities:   Motivational Interviewing Public relations account executive Therapy Crisis Intervention Model SMART goals setting  Marie Chow G. [place], [facility] 12/07/2015 10:49 AM

## 2015-12-08 LAB — GLUCOSE, CAPILLARY
Glucose-Capillary: 104 mg/dL — ABNORMAL HIGH (ref 65–99)
Glucose-Capillary: 88 mg/dL (ref 65–99)
Glucose-Capillary: 123 mg/dL — ABNORMAL HIGH (ref 65–99)
Glucose-Capillary: 108 mg/dL — ABNORMAL HIGH (ref 65–99)

## 2015-12-08 LAB — HEMOGLOBIN A1C
Hgb A1c MFr Bld: 6 % — ABNORMAL HIGH (ref 4.8–5.6)
Mean Plasma Glucose: 126 mg/dL

## 2015-12-08 LAB — PROLACTIN: Prolactin: 14.1 ng/mL (ref 4.8–23.3)

## 2015-12-08 MED ORDER — PALIPERIDONE ER 3 MG PO TB24: 3.0000 mg | ORAL_TABLET | Freq: Every day | ORAL | Status: DC

## 2015-12-08 MED ORDER — PALIPERIDONE ER 3 MG PO TB24
6.0000 mg | ORAL_TABLET | Freq: Every day | ORAL | Status: DC
  Administered 2015-12-09 – 2015-12-10 (×2): 6 mg via ORAL
  Filled 2015-12-08 (×2): qty 2

## 2015-12-08 NOTE — Progress Notes (Signed)
D:  Per pt self inventory pt reports sleeping fair, appetite fair, energy level normal, ability to pay attention good, rates depression at a 7 out of 10, hopelessness at a 7 out of 10, anxiety at a 5 out of 10, denies HI/AVH, goal today: "getting used to this place, pay attention to my surroundings", pleasant during interaction, flat/anxious, endorses on and off SI, contracts for safety.     A:  Emotional support provided, Encouraged pt to continue with treatment plan and attend all group activities, q15 min checks maintained for safety.  R:  Pt is receptive, going to groups, pleasant and cooperative with staff and other patients on the unit.

## 2015-12-08 NOTE — Plan of Care (Signed)
Problem: Medication: Goal: Compliance with prescribed medication regimen will improve Outcome: Progressing Motivated for learning: understand medication intake and storage.

## 2015-12-08 NOTE — [facility] Group Notes (Signed)
[place] LCSW Group Therapy  12/08/2015 2:15 PM  Type of Therapy:  Group Therapy  Participation Level:  Active  Participation Quality:  Attentive and Drowsy  Affect:  Appropriate  Cognitive:  Appropriate  Insight:  Engaged  Engagement in Therapy:  Engaged  Modes of Intervention:  Activity, Discussion, Education and Support  Summary of Progress/Problems:Feelings around Relapse. Group members discussed the meaning of relapse and shared personal stories of relapse, how it affected them and others, and how they perceived themselves during this time. Group members were encouraged to identify triggers, warning signs and coping skills used when facing the possibility of relapse. Social supports were discussed and explored in detail. Patients also discussed facing disappointment and how that can trigger someone to relapse. Patient discussed learning to cope with the stress of her job. Patient talked about going back to school to do something different and shared that her family was supportive of her going back to school.    Draven Natter G. [place], [place] 12/08/2015, 2:17 PM

## 2015-12-08 NOTE — Progress Notes (Signed)
Recreation Therapy Notes  Date: 11.10.17 Time: 9:30 am Location: Craft Room  Group Topic: Coping Skills  Goal Area(s) Addresses:  Patient will participate in healthy coping skill. Patient will verbalize benefit of using art as a coping skill.  Behavioral Response: Did not attend  Intervention: Coloring  Activity: Patients were given coloring sheets to color and were instructed to think about what emotions they were feeling as well as what their minds were focused on.  Education: LRT educated patients on healthy coping skills.  Education Outcome: Patient did not attend group.  Clinical Observations/Feedback: Patient did not attend group.   Leonette Monarch, LRT/CTRS 12/08/2015 10:21 AM

## 2015-12-08 NOTE — Progress Notes (Signed)
PT was resting in room when [facility] entered. PT was kind and welcome to conversation. [place] and PT talked briefly about how she was feeling, specifically about her tiredness. CH offered to come back later, to which PT was welcome and warm.

## 2015-12-08 NOTE — Progress Notes (Signed)
[facility] MD Progress Note  12/08/2015 12:20 PM Shari [name]  MRN:  [id number]  Subjective:  Shari [name] is a 20 year old female with history of depression admitted for her first psychotic break of a few days' duration. The patient experiences auditory hallucinations that are very frightening to her. She is not able or willing to disclose the content as she believes that it would hurt her family and even patients and staff in the hospital. She has been rather disorganized in her thinking as well. She was started on Abilify in the emergency room but could not tolerate it due to vomiting. Abilify was substituted with the Invega.  Today the patient seems rather disorganized and somewhat confused. I was watching her walking around the unit unable to find her room and talking to herself. She is frightened, paranoid and hallucinating. She does not think medication has been helpful at all. She slept 7 hours. Appetite is fair. She is not able to participate in programming effectively.  Principal Problem: Schizophrenia spectrum disorder with psychotic disorder type not yet determined Diagnosis:   Patient Active Problem List   Diagnosis Date Noted  . Asthma [J45.909] 12/07/2015  . Diabetes mellitus without complication ([place]) A999333 12/06/2015  . Obesity [E66.9] 12/06/2015  . Chronic pain [G89.29] 12/06/2015  . Schizophrenia spectrum disorder with psychotic disorder type not yet determined [F29] 12/06/2015   Total Time spent with patient: 20 minutes  Past Psychiatric History: Depression, psychosis.  Past Medical History:  Past Medical History:  Diagnosis Date  . Asthma   . Diabetes mellitus without complication [facility])     Past Surgical History:  Procedure Laterality Date  . EYE SURGERY    . TONSILLECTOMY     Family History: History reviewed. No pertinent family history. Family Psychiatric  History: See H&P. Social History:  History  Alcohol Use No     History  Drug Use No    Social  History   Social History  . Marital status: Single    Spouse name: N/A  . Number of children: N/A  . Years of education: N/A   Social History Main Topics  . Smoking status: Never Smoker  . Smokeless tobacco: Never Used  . Alcohol use No  . Drug use: No  . Sexual activity: Not Asked   Other Topics Concern  . None   Social History Narrative  . None   Additional Social History:                         Sleep: Fair  Appetite:  Fair  Current Medications: Current Facility-Administered Medications  Medication Dose Route Frequency Provider Last Rate Last Dose  . acetaminophen (TYLENOL) tablet 650 mg  650 mg Oral Q6H PRN Gonzella Lex, MD   650 mg at 12/07/15 1030  . albuterol (PROVENTIL HFA;VENTOLIN HFA) 108 (90 Base) MCG/ACT inhaler 2 puff  2 puff Inhalation Q4H PRN Gonzella Lex, MD      . alum & mag hydroxide-simeth (MAALOX/MYLANTA) 200-200-20 MG/5ML suspension 30 mL  30 mL Oral Q4H PRN Gonzella Lex, MD      . benztropine (COGENTIN) tablet 1 mg  1 mg Oral BID PRN Gonzella Lex, MD      . hydrOXYzine (ATARAX/VISTARIL) tablet 25 mg  25 mg Oral TID PRN Gonzella Lex, MD   25 mg at 12/07/15 2202  . magnesium hydroxide (MILK OF MAGNESIA) suspension 30 mL  30 mL Oral Daily PRN Gonzella Lex, MD      .  metFORMIN (GLUCOPHAGE) tablet 500 mg  500 mg Oral BID WC Gonzella Lex, MD   500 mg at 12/08/15 0827  . paliperidone (INVEGA) 24 hr tablet 3 mg  3 mg Oral Daily Carolan Avedisian B Kayden Amend, MD   3 mg at 12/08/15 0827  . traZODone (DESYREL) tablet 100 mg  100 mg Oral QHS PRN Gonzella Lex, MD        Lab Results:  Results for orders placed or performed during the hospital encounter of 12/06/15 (from the past 48 hour(s))  Hemoglobin A1c     Status: Abnormal   Collection Time: 12/07/15  6:52 AM  Result Value Ref Range   Hgb A1c MFr Bld 6.0 (H) 4.8 - 5.6 %    Comment: (NOTE)         Pre-diabetes: 5.7 - 6.4         Diabetes: >6.4         Glycemic control for adults with  diabetes: <7.0    Mean Plasma Glucose 126 mg/dL    Comment: (NOTE) Performed At: [facility] [place], [place] [id number] Lindon Romp MD [id number]   Lipid panel     Status: Abnormal   Collection Time: 12/07/15  6:52 AM  Result Value Ref Range   Cholesterol 321 (H) 0 - 200 mg/dL   Triglycerides 71 <150 mg/dL   HDL 41 >40 mg/dL   Total CHOL/HDL Ratio 7.8 RATIO   VLDL 14 0 - 40 mg/dL   LDL Cholesterol 266 (H) 0 - 99 mg/dL    Comment:        Total Cholesterol/HDL:CHD Risk Coronary Heart Disease Risk Table                     Men   Women  1/2 Average Risk   3.4   3.3  Average Risk       5.0   4.4  2 X Average Risk   9.6   7.1  3 X Average Risk  23.4   11.0        Use the calculated Patient Ratio above and the CHD Risk Table to determine the patient's CHD Risk.        ATP III CLASSIFICATION (LDL):  <100     mg/dL   Optimal  100-129  mg/dL   Near or Above                    Optimal  130-159  mg/dL   Borderline  160-189  mg/dL   High  >190     mg/dL   Very High   Prolactin     Status: None   Collection Time: 12/07/15  6:52 AM  Result Value Ref Range   Prolactin 14.1 4.8 - 23.3 ng/mL    Comment: (NOTE) Performed At: [facility] [place], [place] [id number] Lindon Romp MD [id number]   TSH     Status: None   Collection Time: 12/07/15  6:52 AM  Result Value Ref Range   TSH 1.265 0.350 - 4.500 uIU/mL    Comment: Performed by a 3rd Generation assay with a functional sensitivity of <=0.01 uIU/mL.  Glucose, capillary     Status: None   Collection Time: 12/07/15  4:33 PM  Result Value Ref Range   Glucose-Capillary 97 65 - 99 mg/dL   Comment 1 Notify RN   Glucose, capillary     Status: None   Collection Time:  12/07/15  9:14 PM  Result Value Ref Range   Glucose-Capillary 95 65 - 99 mg/dL   Comment 1 Notify RN   Glucose, capillary     Status: Abnormal   Collection Time: 12/08/15  7:18 AM  Result Value Ref Range    Glucose-Capillary 104 (H) 65 - 99 mg/dL  Glucose, capillary     Status: None   Collection Time: 12/08/15 12:16 PM  Result Value Ref Range   Glucose-Capillary 88 65 - 99 mg/dL    Blood Alcohol level:  Lab Results  Component Value Date   ETH <5 A999333    Metabolic Disorder Labs: Lab Results  Component Value Date   HGBA1C 6.0 (H) 12/07/2015   MPG 126 12/07/2015   Lab Results  Component Value Date   PROLACTIN 14.1 12/07/2015   Lab Results  Component Value Date   CHOL 321 (H) 12/07/2015   TRIG 71 12/07/2015   HDL 41 12/07/2015   CHOLHDL 7.8 12/07/2015   VLDL 14 12/07/2015   LDLCALC 266 (H) 12/07/2015    Physical Findings: AIMS:  , ,  ,  ,    CIWA:    COWS:     Musculoskeletal: Strength & Muscle Tone: within normal limits Gait & Station: normal Patient leans: N/A  Psychiatric Specialty Exam: Physical Exam  Nursing note and vitals reviewed.   Review of Systems  Psychiatric/Behavioral: Positive for depression and hallucinations.  All other systems reviewed and are negative.   Blood pressure 138/78, pulse 98, temperature 97.8 F (36.6 C), resp. rate 18, height 5\' 8"  (1.727 m), weight 136.1 kg (300 lb), last menstrual period 11/15/2015, SpO2 100 %.Body mass index is 45.61 kg/m.  General Appearance: Casual  Eye Contact:  Good  Speech:  Clear and Coherent  Volume:  Normal  Mood:  Anxious  Affect:  Appropriate  Thought Process:  Disorganized and Descriptions of Associations: Loose  Orientation:  Full (Time, Place, and Person)  Thought Content:  Delusions, Hallucinations: Auditory and Paranoid Ideation  Suicidal Thoughts:  No  Homicidal Thoughts:  No  Memory:  Immediate;   Fair Recent;   Fair Remote;   Fair  Judgement:  Impaired  Insight:  Lacking  Psychomotor Activity:  Normal  Concentration:  Concentration: Fair and Attention Span: Fair  Recall:  [employer] of Knowledge:  Fair  Language:  Fair  Akathisia:  No  Handed:  Right  AIMS (if  indicated):     Assets:  Communication Skills Desire for Improvement Financial Resources/Insurance Housing Intimacy Physical Health Resilience Social Support Talents/Skills Transportation Vocational/Educational  ADL's:  Intact  Cognition:  WNL  Sleep:  Number of Hours: 7.5     Treatment Plan Summary: Daily contact with patient to assess and evaluate symptoms and progress in treatment and Medication management   Shari [name] is a 20 year old female with a history of depression admitted for new onset psychosis.  1. Psychosis. She was started on Abilify but could not tolerate it due to vomitting. We switched to Invega and increase dose to 6 mg.  2. Anxiety. Vistaril is available.  3. Insomnia. Trazodone is available.  4. Diabetes. She is on metformin, ADA diet, blood glucose monitoring.   5. Metabolic syndrome monitoring. Lipid panel shows elevated cholesterol. TSH and hemoglobin A1c are normal. PRL 14.  6. EKG. Normal sinus rhythm.  7. Disposition. She will be discharge home with family. She will follow up with [facility].  Orson Slick, MD 12/08/2015, 12:20 PM

## 2015-12-08 NOTE — Progress Notes (Signed)
1945: patient in the dayroom visiting with her family. Pleasant but displaying childlike behavior. Hesitated when she was asked if she is still experiencing hallucinations, patient avoided eye contact and failed to respond. Questioning about discharge.  Patient was encouraged to discuss her feelings with staff. Safety precautions reinforced.

## 2015-12-08 NOTE — Plan of Care (Signed)
Problem: Coping: Goal: Ability to verbalize frustrations and anger appropriately will improve Outcome: Progressing Expressed frustration related to auditory hallucinations. Discussed with RN and expressed her feelings

## 2015-12-08 NOTE — Plan of Care (Signed)
Problem: Bowel/Gastric: Goal: Will not experience complications related to bowel motility Outcome: Progressing Pt has had no episodes of vomit today, denies nausea

## 2015-12-08 NOTE — Plan of Care (Signed)
Problem: Health Behavior/Discharge Planning: Goal: Compliance with treatment plan for underlying cause of condition will improve Outcome: Progressing Patient attended group and remained attentive. Cooperative, mood improving

## 2015-12-08 NOTE — Progress Notes (Signed)
D: patient had medications, stayed in the milieu for a little while, took a shower then went to bed. Patient slept throughout the night (7.5 hours). Has just woke and currently visible in the milieu. Alert and oriented, pleasant and cooperative. A: Staff continue to monitor and to provide support/encouragements. R: Safety maintained. Patient remains in a pleasant mood.

## 2015-12-09 DIAGNOSIS — Z79899 Other long term (current) drug therapy: Secondary | ICD-10-CM

## 2015-12-09 LAB — GLUCOSE, CAPILLARY
GLUCOSE-CAPILLARY: 94 mg/dL (ref 65–99)
GLUCOSE-CAPILLARY: 84 mg/dL (ref 65–99)
Glucose-Capillary: 105 mg/dL — ABNORMAL HIGH (ref 65–99)
Glucose-Capillary: 103 mg/dL — ABNORMAL HIGH (ref 65–99)

## 2015-12-09 MED ORDER — IBUPROFEN 400 MG PO TABS
400.0000 mg | ORAL_TABLET | Freq: Four times a day (QID) | ORAL | Status: DC | PRN
  Administered 2015-12-09 – 2015-12-10 (×4): 400 mg via ORAL
  Filled 2015-12-09 (×4): qty 1

## 2015-12-09 MED ORDER — AMOXICILLIN 500 MG PO CAPS
500.0000 mg | ORAL_CAPSULE | Freq: Two times a day (BID) | ORAL | Status: DC
  Administered 2015-12-09 – 2015-12-11 (×4): 500 mg via ORAL
  Filled 2015-12-09 (×5): qty 1

## 2015-12-09 NOTE — Progress Notes (Signed)
Pt at nurse's station reporting pain in her teeth. Reports she has 5 teeth that are to be pulled at a later date. Pt's face does appear to be swollen on the L cheek. Tylenol given PRN as ordered at 0754, not time yet for another dose. Will inform the Md. Will continue to monitor.

## 2015-12-09 NOTE — Progress Notes (Signed)
Patient currently in bed sleeping. No sign of discomfort. Safety maintained per unit protocol.

## 2015-12-09 NOTE — B[facility] Group Notes (Signed)
[place] LCSW Group Therapy  12/09/2015 2:57 PM  Type of Therapy:  Group Therapy  Participation Level:  Active  Participation Quality:  Appropriate  Affect:  Anxious  Cognitive:  Appropriate  Insight:  Improving  Engagement in Therapy:  Improving  Modes of Intervention:  Activity, Discussion, Education and Support  Summary of Progress/Problems:Self esteem: Patients discussed self esteem and how it impacts them. They discussed what aspects in their lives has influenced their self esteem. They were challenged to identify changes that are needed in order to improve self esteem. Patients participated in activity where they had to identify positive adjectives they felt described their personality. Patients shared with the group on the following areas: Things I am good at, What I like about my appearance, I've helped others by, What I value the most, compliments I have received, challenges I have overcome, thing that make me unique, and Times I've made others happy.    Jordynn Marcella G. Acequia, [place] 12/09/2015, 3:00 PM

## 2015-12-09 NOTE — Plan of Care (Signed)
Problem: Coping: Goal: Ability to cope will improve Outcome: Progressing Denies SI/HI/AVH. Appears blank/flat. Rates anxiety, depression, hopelessness 0/10. Anticipating discharge.

## 2015-12-09 NOTE — Plan of Care (Signed)
Problem: Safety: Goal: Ability to redirect hostility and anger into socially appropriate behaviors will improve Outcome: Progressing No aggressive behavior noted. Pleasant and cooperative

## 2015-12-09 NOTE — Progress Notes (Signed)
Pt awake, alert, oriented on unit today. Appears blank/flat. Pleasant with minimal interaction with peers/staff. Reports good sleep last night without medications. Describes normal energy level, good concentration. Rates anxiety, depression and hopelessness 0/10  (low 0-10 high). States goal is "being more active" by "try to come out of my room as much as I can."  Denies SI/HI/AVH. Quick to respond when asked. Attends groups. Medication compliant. Complains of teeth pain.  Support and encouragement provided with use of therapeutic communication. Medications administered as ordered. Safety maintained with every 15 minute checks. Will continue to monitor.

## 2015-12-09 NOTE — Progress Notes (Signed)
Patient currently visiting with mom and fiancee. Pleasant and cooperative. Denying SI. Denying hallucinations. Expressing readiness for discharge.  Was encouraged to discuss discharge with MD. Emotional support provided. Therapeutic milieu promoted. Safety precautions maintained.

## 2015-12-09 NOTE — Progress Notes (Signed)
[facility] MD Progress Note  12/09/2015 3:51 PM Shari [name]  MRN:  [id number]  Subjective:  [name] is a 20 year old female with history of depression admitted for her first psychotic break of a few days' duration. The patient experiences auditory hallucinations that are very frightening to her. She is not able or willing to disclose the content as she believes that it would hurt her family and even patients and staff in the hospital. She has been rather disorganized in her thinking as well. She was started on Abilify in the emergency room but could not tolerate it due to vomiting. Abilify was substituted with the Invega.  Patient was seen this morning and notes reviewed. She continues to state that she is feeling paranoid and agitated. She later complained of tooth pain to the nurse. She is been observed to have minimal interaction with peers and staff. She is a cooperative. She has been taking her medications. Denies suicidal thoughts.  Principal Problem: Schizophrenia spectrum disorder with psychotic disorder type not yet determined Diagnosis:   Patient Active Problem List   Diagnosis Date Noted  . Asthma [J45.909] 12/07/2015  . Diabetes mellitus without complication ([place]) A999333 12/06/2015  . Obesity [E66.9] 12/06/2015  . Chronic pain [G89.29] 12/06/2015  . Schizophrenia spectrum disorder with psychotic disorder type not yet determined [F29] 12/06/2015   Total Time spent with patient: 20 minutes  Past Psychiatric History: Depression, psychosis.  Past Medical History:  Past Medical History:  Diagnosis Date  . Asthma   . Diabetes mellitus without complication [facility])     Past Surgical History:  Procedure Laterality Date  . EYE SURGERY    . TONSILLECTOMY     Family History: History reviewed. No pertinent family history. Family Psychiatric  History: See H&P. Social History:  History  Alcohol Use No     History  Drug Use No    Social History   Social History  . Marital  status: Single    Spouse name: N/A  . Number of children: N/A  . Years of education: N/A   Social History Main Topics  . Smoking status: Never Smoker  . Smokeless tobacco: Never Used  . Alcohol use No  . Drug use: No  . Sexual activity: Not Asked   Other Topics Concern  . None   Social History Narrative  . None   Additional Social History:                         Sleep: Fair  Appetite:  Fair  Current Medications: Current Facility-Administered Medications  Medication Dose Route Frequency Provider Last Rate Last Dose  . acetaminophen (TYLENOL) tablet 650 mg  650 mg Oral Q6H PRN Gonzella Lex, MD   650 mg at 12/09/15 0754  . albuterol (PROVENTIL HFA;VENTOLIN HFA) 108 (90 Base) MCG/ACT inhaler 2 puff  2 puff Inhalation Q4H PRN Gonzella Lex, MD      . alum & mag hydroxide-simeth (MAALOX/MYLANTA) 200-200-20 MG/5ML suspension 30 mL  30 mL Oral Q4H PRN Gonzella Lex, MD      . amoxicillin (AMOXIL) capsule 500 mg  500 mg Oral BID Mykai Wendorf, MD      . benztropine (COGENTIN) tablet 1 mg  1 mg Oral BID PRN Gonzella Lex, MD      . hydrOXYzine (ATARAX/VISTARIL) tablet 25 mg  25 mg Oral TID PRN Gonzella Lex, MD   25 mg at 12/07/15 2202  . ibuprofen (ADVIL,MOTRIN) tablet 400  mg  400 mg Oral Q6H PRN Timohty Renbarger, MD   400 mg at 12/09/15 1405  . magnesium hydroxide (MILK OF MAGNESIA) suspension 30 mL  30 mL Oral Daily PRN Gonzella Lex, MD      . metFORMIN (GLUCOPHAGE) tablet 500 mg  500 mg Oral BID WC Gonzella Lex, MD   500 mg at 12/09/15 0754  . paliperidone (INVEGA) 24 hr tablet 6 mg  6 mg Oral QHS Jolanta B Pucilowska, MD      . traZODone (DESYREL) tablet 100 mg  100 mg Oral QHS PRN Gonzella Lex, MD        Lab Results:  Results for orders placed or performed during the hospital encounter of 12/06/15 (from the past 48 hour(s))  Glucose, capillary     Status: None   Collection Time: 12/07/15  4:33 PM  Result Value Ref Range   Glucose-Capillary 97 65 - 99  mg/dL   Comment 1 Notify RN   Glucose, capillary     Status: None   Collection Time: 12/07/15  9:14 PM  Result Value Ref Range   Glucose-Capillary 95 65 - 99 mg/dL   Comment 1 Notify RN   Glucose, capillary     Status: Abnormal   Collection Time: 12/08/15  7:18 AM  Result Value Ref Range   Glucose-Capillary 104 (H) 65 - 99 mg/dL  Glucose, capillary     Status: None   Collection Time: 12/08/15 12:16 PM  Result Value Ref Range   Glucose-Capillary 88 65 - 99 mg/dL  Glucose, capillary     Status: Abnormal   Collection Time: 12/08/15  4:47 PM  Result Value Ref Range   Glucose-Capillary 123 (H) 65 - 99 mg/dL  Glucose, capillary     Status: Abnormal   Collection Time: 12/08/15  8:37 PM  Result Value Ref Range   Glucose-Capillary 108 (H) 65 - 99 mg/dL  Glucose, capillary     Status: Abnormal   Collection Time: 12/09/15  6:46 AM  Result Value Ref Range   Glucose-Capillary 105 (H) 65 - 99 mg/dL  Glucose, capillary     Status: Abnormal   Collection Time: 12/09/15 11:39 AM  Result Value Ref Range   Glucose-Capillary 103 (H) 65 - 99 mg/dL    Blood Alcohol level:  Lab Results  Component Value Date   ETH <5 A999333    Metabolic Disorder Labs: Lab Results  Component Value Date   HGBA1C 6.0 (H) 12/07/2015   MPG 126 12/07/2015   Lab Results  Component Value Date   PROLACTIN 14.1 12/07/2015   Lab Results  Component Value Date   CHOL 321 (H) 12/07/2015   TRIG 71 12/07/2015   HDL 41 12/07/2015   CHOLHDL 7.8 12/07/2015   VLDL 14 12/07/2015   LDLCALC 266 (H) 12/07/2015    Physical Findings: AIMS:  , ,  ,  ,    CIWA:    COWS:     Musculoskeletal: Strength & Muscle Tone: within normal limits Gait & Station: normal Patient leans: N/A  Psychiatric Specialty Exam: Physical Exam  Nursing note and vitals reviewed.   Review of Systems  Psychiatric/Behavioral: Positive for depression and hallucinations.  All other systems reviewed and are negative.   Blood pressure (!)  147/81, pulse 93, temperature 98.9 F (37.2 C), resp. rate 18, height 5\' 8"  (1.727 m), weight 300 lb (136.1 kg), last menstrual period 11/15/2015, SpO2 100 %.Body mass index is 45.61 kg/m.  General Appearance: Casual  Eye  Contact:  Good  Speech:  Clear and Coherent  Volume:  Normal  Mood:  Anxious  Affect:  Appropriate  Thought Process:  Disorganized and Descriptions of Associations: Loose  Orientation:  Full (Time, Place, and Person)  Thought Content:  Delusions, Hallucinations: Auditory and Paranoid Ideation  Suicidal Thoughts:  No  Homicidal Thoughts:  No  Memory:  Immediate;   Fair Recent;   Fair Remote;   Fair  Judgement:  Impaired  Insight:  Lacking  Psychomotor Activity:  Normal  Concentration:  Concentration: Fair and Attention Span: Fair  Recall:  [employer] of Knowledge:  Fair  Language:  Fair  Akathisia:  No  Handed:  Right  AIMS (if indicated):     Assets:  Communication Skills Desire for Improvement Financial Resources/Insurance Housing Intimacy Physical Health Resilience Social Support Talents/Skills Transportation Vocational/Educational  ADL's:  Intact  Cognition:  WNL  Sleep:  Number of Hours: 8     Treatment Plan Summary: Daily contact with patient to assess and evaluate symptoms and progress in treatment and Medication management   Ms. Vital is a 20 year old female with a history of depression admitted for new onset psychosis.  1. Psychosis. She was started on Abilify but could not tolerate it due to vomitting. We switched to Invega and increase dose to 6 mg.  2. Anxiety. Vistaril is available.  3. Insomnia. Trazodone is available.  4. Diabetes. She is on metformin, ADA diet, blood glucose monitoring.   5. Metabolic syndrome monitoring. Lipid panel shows elevated cholesterol. TSH and hemoglobin A1c are normal. PRL 14.  6. EKG. Normal sinus rhythm.  7. Tooth pain- he shouldn't can be given ibuprofen 400 mg and started on  amoxicillin at 500 mg twice daily for 7 days.  8. Disposition. She will be discharge home with family. She will follow up with [facility].  Elvin So, MD 12/09/2015, 3:51 PM

## 2015-12-09 NOTE — Plan of Care (Signed)
Problem: Coping: Goal: Ability to cope will improve Outcome: Not Progressing Patient continues to express anxiety related to hallucinations. Continues to hear voices

## 2015-12-10 LAB — GLUCOSE, CAPILLARY
Glucose-Capillary: 86 mg/dL (ref 65–99)
Glucose-Capillary: 90 mg/dL (ref 65–99)
Glucose-Capillary: 76 mg/dL (ref 65–99)
Glucose-Capillary: 88 mg/dL (ref 65–99)

## 2015-12-10 NOTE — Progress Notes (Signed)
[facility] MD Progress Note  12/10/2015 12:00 PM Shari [name]  MRN:  [id number]  Subjective:  Shari [name] is a 20 year old female with history of depression admitted for her first psychotic break of a few days' duration. The patient experiences auditory hallucinations that are very frightening to her. She is not able or willing to disclose the content as she believes that it would hurt her family and even patients and staff in the hospital. She has been rather disorganized in her thinking as well. She was started on Abilify in the emergency room but could not tolerate it due to vomiting. Abilify was substituted with the Invega.  Patient was seen this morning and notes reviewed. She continues to state that she is feeling better this morning. She had a good visit with her mother and her fianc. She does endorse that she continues to feel paranoid. When asked specifically she reports that she is feeling safe in the hospital but she worries about something happening to her mother grandmother and the other family members. She does not have a clear answer as to what she thinks would happen to them. She denies any suicidal or homicidal thoughts. She is very focused on discharge. Compliant on the unit and not a behavioral disturbance.  Principal Problem: Schizophrenia spectrum disorder with psychotic disorder type not yet determined Diagnosis:   Patient Active Problem List   Diagnosis Date Noted  . Asthma [J45.909] 12/07/2015  . Diabetes mellitus without complication ([place]) A999333 12/06/2015  . Obesity [E66.9] 12/06/2015  . Chronic pain [G89.29] 12/06/2015  . Schizophrenia spectrum disorder with psychotic disorder type not yet determined [F29] 12/06/2015   Total Time spent with patient: 20 minutes  Past Psychiatric History: Depression, psychosis.  Past Medical History:  Past Medical History:  Diagnosis Date  . Asthma   . Diabetes mellitus without complication [facility])     Past Surgical History:   Procedure Laterality Date  . EYE SURGERY    . TONSILLECTOMY     Family History: History reviewed. No pertinent family history. Family Psychiatric  History: See H&P. Social History:  History  Alcohol Use No     History  Drug Use No    Social History   Social History  . Marital status: Single    Spouse name: N/A  . Number of children: N/A  . Years of education: N/A   Social History Main Topics  . Smoking status: Never Smoker  . Smokeless tobacco: Never Used  . Alcohol use No  . Drug use: No  . Sexual activity: Not Asked   Other Topics Concern  . None   Social History Narrative  . None   Additional Social History:                         Sleep: Fair  Appetite:  Fair  Current Medications: Current Facility-Administered Medications  Medication Dose Route Frequency Provider Last Rate Last Dose  . acetaminophen (TYLENOL) tablet 650 mg  650 mg Oral Q6H PRN Gonzella Lex, MD   650 mg at 12/09/15 0754  . albuterol (PROVENTIL HFA;VENTOLIN HFA) 108 (90 Base) MCG/ACT inhaler 2 puff  2 puff Inhalation Q4H PRN Gonzella Lex, MD      . alum & mag hydroxide-simeth (MAALOX/MYLANTA) 200-200-20 MG/5ML suspension 30 mL  30 mL Oral Q4H PRN Gonzella Lex, MD      . amoxicillin (AMOXIL) capsule 500 mg  500 mg Oral BID Kenetha Cozza, MD   500  mg at 12/10/15 0833  . benztropine (COGENTIN) tablet 1 mg  1 mg Oral BID PRN Gonzella Lex, MD      . hydrOXYzine (ATARAX/VISTARIL) tablet 25 mg  25 mg Oral TID PRN Gonzella Lex, MD   25 mg at 12/07/15 2202  . ibuprofen (ADVIL,MOTRIN) tablet 400 mg  400 mg Oral Q6H PRN Clancy Leiner, MD   400 mg at 12/10/15 0834  . magnesium hydroxide (MILK OF MAGNESIA) suspension 30 mL  30 mL Oral Daily PRN Gonzella Lex, MD      . metFORMIN (GLUCOPHAGE) tablet 500 mg  500 mg Oral BID WC Gonzella Lex, MD   500 mg at 12/10/15 0833  . paliperidone (INVEGA) 24 hr tablet 6 mg  6 mg Oral QHS Clovis Fredrickson, MD   6 mg at 12/09/15 2151  .  traZODone (DESYREL) tablet 100 mg  100 mg Oral QHS PRN Gonzella Lex, MD        Lab Results:  Results for orders placed or performed during the hospital encounter of 12/06/15 (from the past 48 hour(s))  Glucose, capillary     Status: None   Collection Time: 12/08/15 12:16 PM  Result Value Ref Range   Glucose-Capillary 88 65 - 99 mg/dL  Glucose, capillary     Status: Abnormal   Collection Time: 12/08/15  4:47 PM  Result Value Ref Range   Glucose-Capillary 123 (H) 65 - 99 mg/dL  Glucose, capillary     Status: Abnormal   Collection Time: 12/08/15  8:37 PM  Result Value Ref Range   Glucose-Capillary 108 (H) 65 - 99 mg/dL  Glucose, capillary     Status: Abnormal   Collection Time: 12/09/15  6:46 AM  Result Value Ref Range   Glucose-Capillary 105 (H) 65 - 99 mg/dL  Glucose, capillary     Status: Abnormal   Collection Time: 12/09/15 11:39 AM  Result Value Ref Range   Glucose-Capillary 103 (H) 65 - 99 mg/dL  Glucose, capillary     Status: None   Collection Time: 12/09/15  5:34 PM  Result Value Ref Range   Glucose-Capillary 94 65 - 99 mg/dL  Glucose, capillary     Status: None   Collection Time: 12/09/15  9:49 PM  Result Value Ref Range   Glucose-Capillary 84 65 - 99 mg/dL  Glucose, capillary     Status: None   Collection Time: 12/10/15  7:26 AM  Result Value Ref Range   Glucose-Capillary 86 65 - 99 mg/dL  Glucose, capillary     Status: None   Collection Time: 12/10/15 11:42 AM  Result Value Ref Range   Glucose-Capillary 90 65 - 99 mg/dL    Blood Alcohol level:  Lab Results  Component Value Date   ETH <5 A999333    Metabolic Disorder Labs: Lab Results  Component Value Date   HGBA1C 6.0 (H) 12/07/2015   MPG 126 12/07/2015   Lab Results  Component Value Date   PROLACTIN 14.1 12/07/2015   Lab Results  Component Value Date   CHOL 321 (H) 12/07/2015   TRIG 71 12/07/2015   HDL 41 12/07/2015   CHOLHDL 7.8 12/07/2015   VLDL 14 12/07/2015   LDLCALC 266 (H)  12/07/2015    Physical Findings: AIMS:  , ,  ,  ,    CIWA:    COWS:     Musculoskeletal: Strength & Muscle Tone: within normal limits Gait & Station: normal Patient leans: N/A  Psychiatric Specialty Exam:  Physical Exam  Nursing note and vitals reviewed.   Review of Systems  Psychiatric/Behavioral: Positive for depression and hallucinations.  All other systems reviewed and are negative.   Blood pressure 124/73, pulse (!) 105, temperature 98.3 F (36.8 C), resp. rate 18, height 5\' 8"  (1.727 m), weight 300 lb (136.1 kg), last menstrual period 11/15/2015, SpO2 100 %.Body mass index is 45.61 kg/m.  General Appearance: Casual  Eye Contact:  Good  Speech:  Clear and Coherent  Volume:  Normal  Mood:  Anxious  Affect:  Appropriate  Thought Process:  Within normal limits   Orientation:  Full (Time, Place, and Person)  Thought Content:  Continued paranoid thoughts   Suicidal Thoughts:  No  Homicidal Thoughts:  No  Memory:  Immediate;   Fair Recent;   Fair Remote;   Fair  Judgement:  Impaired  Insight:  Lacking  Psychomotor Activity:  Normal  Concentration:  Concentration: Fair and Attention Span: Fair  Recall:  [employer] of Knowledge:  Fair  Language:  Fair  Akathisia:  No  Handed:  Right  AIMS (if indicated):     Assets:  Communication Skills Desire for Improvement Financial Resources/Insurance Housing Intimacy Physical Health Resilience Social Support Talents/Skills Transportation Vocational/Educational  ADL's:  Intact  Cognition:  WNL  Sleep:  Number of Hours: 7.45     Treatment Plan Summary: Daily contact with patient to assess and evaluate symptoms and progress in treatment and Medication management   Ms. Depasquale is a 20 year old female with a history of depression admitted for new onset psychosis.  1. Psychosis. She was started on Abilify but could not tolerate it due to vomitting. We switched to Invega and increase dose to 6 mg.  2. Anxiety.  Vistaril is available.  3. Insomnia. Trazodone is available.  4. Diabetes. She is on metformin, ADA diet, blood glucose monitoring.   5. Metabolic syndrome monitoring. Lipid panel shows elevated cholesterol. TSH and hemoglobin A1c are normal. PRL 14.  6. EKG. Normal sinus rhythm.  7. Tooth pain- Patient can be given ibuprofen 400 mg and started on amoxicillin at 500 mg twice daily for 7 days, starting 12/09/2015  8. Disposition. She will be discharge home with family. She will follow up with [facility].  Elvin So, MD 12/10/2015, 12:00 PM

## 2015-12-10 NOTE — Plan of Care (Signed)
Problem: Coping: Goal: Ability to demonstrate self-control will improve Outcome: Progressing Pt denies SI/HI/AVH. Calm and cooperative. Denies paranoia, bad dreams, being scared.

## 2015-12-10 NOTE — Progress Notes (Signed)
Patient had her bedtime medications, performed hygiene and went to bed. Slept all night and had no concern. Staff continue to monitor for safety and other needs.

## 2015-12-10 NOTE — Plan of Care (Signed)
Problem: Medication: Goal: Compliance with prescribed medication regimen will improve Outcome: Progressing Taking medications as prescribed   

## 2015-12-10 NOTE — Progress Notes (Signed)
Pt awake, alert, up on unit today. Interacts appropriately with staff/peers. Appears blank, flat. Focused on discharge, and family has even called the unit to request discharge. Informed pt and family that discharge is up to the doctor. Denies SI/HI/AVH. Reports that her reason for admission was "being home alone, I felt paranoid, scared. I have been having bad dreams." Pt denies any bad dreams, paranoia, being scared while here at the hospital. Reports sleeping good with no sleep medications. Reports fair appetite, normal energy, and good concentration. Rates anxiety, depression and hopelessness 0/10 (low0-10 high). Reports goal today is "being good enough to go home soonj" by "be good do what I normally do." Pt continues to complain about L mouth/teeth pain--antibiotics and IBU given as ordered. Pt is medication compliant.   Support and encourage provided with use of therapeutic communication. Medications administered as ordered with education. Safety maintained with every 15 minute checks. Will continue to monitor.

## 2015-12-10 NOTE — BHH Group Notes (Signed)
[facility] LCSW Group Therapy  12/10/2015 2:43 PM  Type of Therapy:  Group Therapy  Participation Level:  Active  Participation Quality:  Appropriate and Attentive  Affect:  Anxious and Appropriate  Cognitive:  Alert  Insight:  Limited  Engagement in Therapy:  Engaged  Modes of Intervention:  Activity, Discussion, Education and Support  Summary of Progress/Problems:Coping Skills: Patients defined and discussed healthy coping skills. Patients identified healthy coping skills they would like to try during hospitalization and after discharge. CSW offered insight to varying coping skills that may have been new to patients such as practicing mindfulness. Patient stated it was hard for her to participate in the group because she does not like sharing in bigger groups. Patient stated this how she is sometimes at her job. She gets anxious when she has to be around a lot of people.   Demarco Bacci G. [place], [place] 12/10/2015, 2:52 PM

## 2015-12-10 NOTE — Progress Notes (Signed)
1945: Patient visible in the milieu. Alert and oriented. Pleasant and cooperative. Denies SI/HI and hallucinations. Thought process is organized. No sign of distress. Reporting that she is ready for discharge. Staff continue to monitor.

## 2015-12-10 NOTE — Plan of Care (Signed)
Problem: Safety: Goal: Ability to remain free from injury will improve Outcome: Progressing Denies SI/HI, denies hallucinations. Safety precautions maintained

## 2015-12-11 LAB — GLUCOSE, CAPILLARY: Glucose-Capillary: 91 mg/dL (ref 65–99)

## 2015-12-11 MED ORDER — HYDROXYZINE HCL 25 MG PO TABS: 25.0000 mg | ORAL_TABLET | Freq: Three times a day (TID) | ORAL | 0 refills | Status: DC | PRN

## 2015-12-11 MED ORDER — AMOXICILLIN 500 MG PO CAPS: 500.0000 mg | ORAL_CAPSULE | Freq: Two times a day (BID) | ORAL | 0 refills | Status: DC

## 2015-12-11 MED ORDER — PALIPERIDONE ER 6 MG PO TB24: 6.0000 mg | ORAL_TABLET | Freq: Every day | ORAL | 1 refills | Status: DC

## 2015-12-11 MED ORDER — TRAZODONE HCL 100 MG PO TABS: 100.0000 mg | ORAL_TABLET | Freq: Every evening | ORAL | 1 refills | Status: DC | PRN

## 2015-12-11 NOTE — Plan of Care (Signed)
Problem: Pain Managment: Goal: General experience of comfort will improve Outcome: Progressing Pain assessed and managed by medications

## 2015-12-11 NOTE — Plan of Care (Signed)
Problem: Health Behavior/Discharge Planning: Goal: Ability to manage health-related needs will improve Outcome: Progressing Motivated for outpatient therapy and church support

## 2015-12-11 NOTE — Progress Notes (Signed)
Pt awake, alert, oriented and already asking about discharge this morning. Denies SI/HI/AVH. Denies paranoia. Pt is pleasant, cooperative. Appears flat, blunted, but brightens on approach. Reports good sleep without medications, fair appetite, normal energy, good concentration. Rates anxiety, depression, and hopelessness 0/10 (low 0-10 high). Continues to complain about teeth/mouth pain, but reports IBU/tylenol ordered PRN is helpful. Goal today is "getting everything set up for when I go home so that things continue to go well" by "talk to whoever I have to." Pt is currently being interviewed for outpatient treatment by Mr. Sherrian Divers ([facility]).  Pt is medication complaint.   Support and encouragement provided with use of therapeutic communication. Medications administered as ordered with education. Safety maintained with every 15 minute checks.Will continue to monitor.

## 2015-12-11 NOTE — BHH Suicide Risk Assessment (Signed)
[facility] Discharge Suicide Risk Assessment   Principal Problem: Schizophrenia spectrum disorder with psychotic disorder type not yet determined Discharge Diagnoses:  Patient Active Problem List   Diagnosis Date Noted  . Asthma [J45.909] 12/07/2015  . Diabetes mellitus without complication ([place]) A999333 12/06/2015  . Obesity [E66.9] 12/06/2015  . Chronic pain [G89.29] 12/06/2015  . Schizophrenia spectrum disorder with psychotic disorder type not yet determined [F29] 12/06/2015    Total Time spent with patient: 30 minutes  Musculoskeletal: Strength & Muscle Tone: within normal limits Gait & Station: normal Patient leans: N/A  Psychiatric Specialty Exam: Review of Systems  All other systems reviewed and are negative.   Blood pressure 129/71, pulse 97, temperature 98.2 F (36.8 C), temperature source Oral, resp. rate 18, height 5\' 8"  (1.727 m), weight 136.1 kg (300 lb), last menstrual period 11/15/2015, SpO2 100 %.Body mass index is 45.61 kg/m.  General Appearance: Casual  Eye Contact::  Good  Speech:  Clear and [id number]  Volume:  Normal  Mood:  Anxious  Affect:  Appropriate  Thought Process:  Goal Directed  Orientation:  Full (Time, Place, and Person)  Thought Content:  WDL  Suicidal Thoughts:  No  Homicidal Thoughts:  No  Memory:  Immediate;   Fair Recent;   Fair Remote;   Fair  Judgement:  Fair  Insight:  Fair  Psychomotor Activity:  Normal  Concentration:  Fair  Recall:  [employer] of [place]  Language: Fair  Akathisia:  No  Handed:  Right  AIMS (if indicated):     Assets:  Communication Skills Desire for Improvement Financial Resources/Insurance Housing Intimacy Physical Health Resilience Social Support Talents/Skills Transportation Vocational/Educational  Sleep:  Number of Hours: 7.5  Cognition: WNL  ADL's:  Intact   Mental Status Per Nursing Assessment::   On Admission:  Suicidal ideation indicated by patient  Demographic Factors:  Gay,  lesbian, or bisexual orientation  Loss Factors: NA  Historical Factors: Impulsivity  Risk Reduction Factors:   Sense of responsibility to family, Employed, Living with another person, especially a relative and Positive social support  Continued Clinical Symptoms:  Schizophrenia:   Less than 53 years old  Cognitive Features That Contribute To Risk:  None    Suicide Risk:  Minimal: No identifiable suicidal ideation.  Patients presenting with no risk factors but with morbid ruminations; may be classified as minimal risk based on the severity of the depressive symptoms  [facility] Follow up.   Why:  Please arrive to the walk-in clinic between the hours of 8am-2:30pm for an assessment for medication management and therapy.  Arrive as early as possible for prompt service.  Please call Sherrian Divers at [phone] for questions and assistance. Contact information: [address] [place]  [phone]           Plan Of Care/Follow-up recommendations:  Activity:  as tolerated. Diet:  low sodium heart healthy ADA diet. Other:  keep follow up appointments.  Orson Slick, MD 12/11/2015, 9:00 AM

## 2015-12-11 NOTE — Plan of Care (Signed)
Problem: Medication: Goal: Compliance with prescribed medication regimen will improve Outcome: Progressing Knowledgeable of her medication regime. Taking her medications voluntarily, as scheduled. Willing to remain compliant after discharge.

## 2015-12-11 NOTE — Tx Team (Signed)
Interdisciplinary Treatment and Diagnostic Plan Update  12/11/2015 Time of Session: 10:30 Shari [name] MRN: [id number]  Principal Diagnosis: Schizophrenia spectrum disorder with psychotic disorder type not yet determined  Secondary Diagnoses: Principal Problem:   Schizophrenia spectrum disorder with psychotic disorder type not yet determined Active Problems:   Diabetes mellitus without complication ([place])   Obesity   Asthma   Current Medications:  No current facility-administered medications for this encounter.    Current Outpatient Prescriptions  Medication Sig Dispense Refill  . albuterol (PROVENTIL HFA;VENTOLIN HFA) 108 (90 BASE) MCG/ACT inhaler Inhale 2 puffs into the lungs every 6 (six) hours as needed for wheezing or shortness of breath. 1 Inhaler 2  . amoxicillin (AMOXIL) 500 MG capsule Take 1 capsule (500 mg total) by mouth 2 (two) times daily. 10 capsule 0  . hydrOXYzine (ATARAX/VISTARIL) 25 MG tablet Take 1 tablet (25 mg total) by mouth 3 (three) times daily as needed for anxiety. 90 tablet 0  . ibuprofen (ADVIL,MOTRIN) 800 MG tablet Take 1 tablet (800 mg total) by mouth every 8 (eight) hours as needed. 30 tablet 0  . metFORMIN (GLUCOPHAGE) 500 MG tablet Take 500 mg by mouth 2 (two) times daily with a meal.    . paliperidone (INVEGA) 6 MG 24 hr tablet Take 1 tablet (6 mg total) by mouth at bedtime. 30 tablet 1  . traZODone (DESYREL) 100 MG tablet Take 1 tablet (100 mg total) by mouth at bedtime as needed for sleep. 30 tablet 1   PTA Medications: No prescriptions prior to admission.    Patient Stressors: Other: new onset auditory hallucinations  Patient Strengths: Ability for insight Average or above average intelligence Communication skills  Treatment Modalities: Medication Management, Group therapy, Case management,  1 to 1 session with clinician, Psychoeducation, Recreational therapy.   Physician Treatment Plan for Primary Diagnosis: Schizophrenia spectrum  disorder with psychotic disorder type not yet determined Long Term Goal(s): Improvement in symptoms so as ready for discharge NA   Short Term Goals: Ability to identify changes in lifestyle to reduce recurrence of condition will improve Ability to verbalize feelings will improve Ability to disclose and discuss suicidal ideas Ability to demonstrate self-control will improve Ability to identify and develop effective coping behaviors will improve Ability to maintain clinical measurements within normal limits will improve Compliance with prescribed medications will improve NA  Medication Management: Evaluate patient's response, side effects, and tolerance of medication regimen.  Therapeutic Interventions: 1 to 1 sessions, Unit Group sessions and Medication administration.  Evaluation of Outcomes: Adequate for discharge   Physician Treatment Plan for Secondary Diagnosis: Principal Problem:   Schizophrenia spectrum disorder with psychotic disorder type not yet determined Active Problems:   Diabetes mellitus without complication ([place])   Obesity   Asthma  Long Term Goal(s): Improvement in symptoms so as ready for discharge NA   Short Term Goals: Ability to identify changes in lifestyle to reduce recurrence of condition will improve Ability to verbalize feelings will improve Ability to disclose and discuss suicidal ideas Ability to demonstrate self-control will improve Ability to identify and develop effective coping behaviors will improve Ability to maintain clinical measurements within normal limits will improve Compliance with prescribed medications will improve NA     Medication Management: Evaluate patient's response, side effects, and tolerance of medication regimen.  Therapeutic Interventions: 1 to 1 sessions, Unit Group sessions and Medication administration.  Evaluation of Outcomes: Adequate for discharge   RN Treatment Plan for Primary Diagnosis: Schizophrenia spectrum  disorder with psychotic disorder type  not yet determined Long Term Goal(s): Knowledge of disease and therapeutic regimen to maintain health will improve  Short Term Goals: Ability to disclose and discuss suicidal ideas, Ability to identify and develop effective coping behaviors will improve and Compliance with prescribed medications will improve  Medication Management: RN will administer medications as ordered by provider, will assess and evaluate patient's response and provide education to patient for prescribed medication. RN will report any adverse and/or side effects to prescribing provider.  Therapeutic Interventions: 1 on 1 counseling sessions, Psychoeducation, Medication administration, Evaluate responses to treatment, Monitor vital signs and CBGs as ordered, Perform/monitor CIWA, COWS, AIMS and Fall Risk screenings as ordered, Perform wound care treatments as ordered.  Evaluation of Outcomes: Adequate for discharge   LCSW Treatment Plan for Primary Diagnosis: Schizophrenia spectrum disorder with psychotic disorder type not yet determined Long Term Goal(s): Safe transition to appropriate next level of care at discharge, Engage patient in therapeutic group addressing interpersonal concerns.  Short Term Goals: Engage patient in aftercare planning with referrals and resources, Increase social support and Increase ability to appropriately verbalize feelings  Therapeutic Interventions: Assess for all discharge needs, 1 to 1 time with Social worker, Explore available resources and support systems, Assess for adequacy in community support network, Educate family and significant other(s) on suicide prevention, Complete Psychosocial Assessment, Interpersonal group therapy.  Evaluation of Outcomes: Adequate for discharge   Progress in Treatment: Attending groups: No. CSW still assessing, pt new to milieu Participating in groups: No. CSW still assessing, pt new to milieu Taking medication as  prescribed: Yes. Toleration medication: Yes. Family/Significant other contact made: No, will contact:  pt's family Patient understands diagnosis: Yes. Discussing patient identified problems/goals with staff: Yes. Medical problems stabilized or resolved: Yes. Denies suicidal/homicidal ideation: Yes. Issues/concerns per patient self-inventory: No. Other: none reported  New problem(s) identified: No, Describe:  none reported  New Short Term/Long Term Goal(s):  Discharge Plan or Barriers:   Reason for Continuation of Hospitalization: Anxiety Depression Hallucinations Suicidal ideation  Estimated Length of Stay: 3-5 days  Attendees: Patient: Shari [name] 12/07/2015 10:51 AM  Physician: Dr. Bary Leriche, MD 12/07/2015 10:51 AM  Nursing: Carolynn Sayers, RN 12/07/2015 10:51 AM  RN Care Manager:  12/07/2015 10:51 AM  Social Worker: Alphonse Guild. Daine Gravel, LCAS 12/07/2015 10:51 AM  Recreational Therapist: Everitt Amber, LRT 12/07/2015 10:51 AM  Other:  12/07/2015 10:51 AM  Other:  12/07/2015 10:51 AM  Other: 12/07/2015 10:51 AM    Scribe for Treatment Team: Alphonse Guild Hassen Bruun, LCSWA 12/07/2015  Updated 12/11/15 Alphonse Guild. Daisuke Bailey, LCSWA, LCAS   12/11/15

## 2015-12-11 NOTE — BHH Group Notes (Signed)
[facility] Group Notes:  (Nursing/MHT/Case Management/Adjunct)  Date:  12/11/2015  Time:  12:47 AM  Type of Therapy:  Psychoeducational Skills  Participation Level:  Active  Participation Quality:  Appropriate  Affect:  Appropriate  Cognitive:  Appropriate  Insight:  Appropriate  Engagement in Group:  Engaged  Modes of Intervention:  Discussion, Socialization and Support  Summary of Progress/Problems:  Shari [name] 12/11/2015, 12:47 AM

## 2015-12-11 NOTE — Progress Notes (Signed)
Provided and reviewed discharge paperwork and prescriptions. Verified understanding by use of teach back method. Verbalizes understanding as well. Pt belongings returned from locker to include: animal print plastic duffle bag, one black bra. Mother to come pick up pt and transport back home to live with mother. Pt denies SI/HI/AVH. Support and encouragement provided. Safety maintained. Will continue to monitor and will discharge when pt's mother arrives for pick up.

## 2015-12-11 NOTE — Discharge Summary (Signed)
Physician Discharge Summary Note  Patient:  Shari [name] is an 20 y.o., female MRN:  [id number] DOB:  09-10-1995 Patient phone:  [phone] (home)  Patient address:   [address] [place] [address],  Total Time spent with patient: 30 minutes  Date of Admission:  12/06/2015 Date of Discharge: 12/11/2015  Reason for Admission:  Psychotic break.  Identifying data. Mr. Shari [name] is a 20 year old female with a history of depression.  Chief complaint. "I don't want anything bad to happen to you."  History of present illness. Information was obtained from the patient and the chart. The patient has a history of depression and has been treated with Celexa for the past year or so. For the past 3 days the patient started experiencing auditory hallucinations that are command in nature. She is not forthcoming about the context of her voices as she fears that he may hurt her family and even [place] in the hospital. Apparently in the past several weeks she was suspicious of her coworker thinking that this person may be a devil in disguise. She is paranoid and very frightened. She denies current symptoms of depression but admits that in the past she had intrusive thoughts of suicide and most likely to overdose of medication at some point. She was not hospitalized for that. She does not report heightened anxiety with her first panic attack on the day of admission but no symptoms suggestive of PTSD or OCD. She does not drink or use alcohol. Yesterday she was started on Abilify for psychosis but could not tolerate it due to vomiting. She was weak and diaphoretic and has to be assisted to her room.  Past psychiatric history. She was prescribed Celexa for depression in the past year or so but was not consistently taking it. She did not feel it was making a difference. She denies previous hospitalizations. In the past she was drinking a little but never thought this was a problem. She stopped drinking when she  was prescribed medications.  Family psychiatric history. Mother with mental illness on medications, rather with PTSD, uncle with schizophrenia.   Social history. She is single. She is in a lesbian relationship. She lives with her family. 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 She likes her job but is very anxious talking to her clients. She has high anxiety before going to work.   Principal Problem: Schizophrenia spectrum disorder with psychotic disorder type not yet determined Discharge Diagnoses: Patient Active Problem List   Diagnosis Date Noted  . Asthma [J45.909] 12/07/2015  . Diabetes mellitus without complication ([place]) A999333 12/06/2015  . Obesity [E66.9] 12/06/2015  . Chronic pain [G89.29] 12/06/2015  . Schizophrenia spectrum disorder with psychotic disorder type not yet determined [F29] 12/06/2015     Past Medical History:  Past Medical History:  Diagnosis Date  . Asthma   . Diabetes mellitus without complication [facility])     Past Surgical History:  Procedure Laterality Date  . EYE SURGERY    . TONSILLECTOMY     Family History: History reviewed. No pertinent family history.  Social History:  History  Alcohol Use No     History  Drug Use No    Social History   Social History  . Marital status: Single    Spouse name: N/A  . Number of children: N/A  . Years of education: N/A   Social History Main Topics  . Smoking status: Never Smoker  . Smokeless tobacco: Never Used  . Alcohol use  No  . Drug use: No  . Sexual activity: Not Asked   Other Topics Concern  . None   Social History Narrative  . None    Hospital Course:    Shari [name] is a 20 year old female with a history of depression admitted for new onset psychosis.  1. Psychosis. She was started on Abilify but could not tolerate it due to vomitting and was switched to [facility] which she tolerated well.   2. Anxiety. Vistaril was available.  3. Insomnia. Trazodone was  available.  4. Diabetes. She was on metformin, ADA diet, blood glucose monitoring.   5. Metabolic syndrome monitoring. Lipid panel shows elevated cholesterol. TSH and hemoglobin A1c are normal. PRL 14.  6. EKG. Normal sinus rhythm.  7. Tooth ache. She was started on amoxicillin at 500 mg twice daily for 7 days.  8. Disposition. She was discharged home with family. She will follow up with [facility].   Physical Findings: AIMS:  , ,  ,  ,    CIWA:    COWS:     Musculoskeletal: Strength & Muscle Tone: within normal limits Gait & Station: normal Patient leans: N/A  Psychiatric Specialty Exam: Physical Exam  Nursing note and vitals reviewed.   Review of Systems  All other systems reviewed and are negative.   Blood pressure 129/71, pulse 97, temperature 98.2 F (36.8 C), temperature source Oral, resp. rate 18, height 5\' 8"  (1.727 m), weight 136.1 kg (300 lb), last menstrual period 11/15/2015, SpO2 100 %.Body mass index is 45.61 kg/m.  General Appearance: Casual  Eye Contact:  Good  Speech:  Clear and Coherent  Volume:  Normal  Mood:  Anxious  Affect:  Appropriate  Thought Process:  Goal Directed and Descriptions of Associations: Intact  Orientation:  Full (Time, Place, and Person)  Thought Content:  WDL  Suicidal Thoughts:  No  Homicidal Thoughts:  No  Memory:  Immediate;   Fair Recent;   Fair Remote;   Fair  Judgement:  Fair  Insight:  Fair  Psychomotor Activity:  Normal  Concentration:  Concentration: Fair and Attention Span: Fair  Recall:  [employer] of Knowledge:  Fair  Language:  Fair  Akathisia:  No  Handed:  Right  AIMS (if indicated):     Assets:  Communication Skills Desire for Improvement Financial Resources/Insurance [place] Talents/Skills Transportation Vocational/Educational  ADL's:  Intact  Cognition:  WNL  Sleep:  Number of Hours: 7.5     Have you used any form of tobacco in the last  30 days? (Cigarettes, Smokeless Tobacco, Cigars, and/or Pipes): No  Has this patient used any form of tobacco in the last 30 days? (Cigarettes, Smokeless Tobacco, Cigars, and/or Pipes) Yes, No  Blood Alcohol level:  Lab Results  Component Value Date   ETH <5 A999333    Metabolic Disorder Labs:  Lab Results  Component Value Date   HGBA1C 6.0 (H) 12/07/2015   MPG 126 12/07/2015   Lab Results  Component Value Date   PROLACTIN 14.1 12/07/2015   Lab Results  Component Value Date   CHOL 321 (H) 12/07/2015   TRIG 71 12/07/2015   HDL 41 12/07/2015   CHOLHDL 7.8 12/07/2015   VLDL 14 12/07/2015   LDLCALC 266 (H) 12/07/2015    See Psychiatric Specialty Exam and Suicide Risk Assessment completed by Attending Physician prior to discharge.  Discharge destination:  Home  Is patient on multiple antipsychotic therapies at discharge:  No  Has Patient had three or more failed trials of antipsychotic monotherapy by history:  No  Recommended Plan for Multiple Antipsychotic Therapies: NA  Discharge Instructions    Diet - low sodium heart healthy    Complete by:  As directed    Increase activity slowly    Complete by:  As directed        Medication List    STOP taking these medications   gabapentin 100 MG capsule Commonly known as:  NEURONTIN     TAKE these medications     Indication  albuterol 108 (90 Base) MCG/ACT inhaler Commonly known as:  PROVENTIL HFA;VENTOLIN HFA Inhale 2 puffs into the lungs every 6 (six) hours as needed for wheezing or shortness of breath. What changed:  Another medication with the same name was removed. Continue taking this medication, and follow the directions you see here.  Indication:  Asthma   amoxicillin 500 MG capsule Commonly known as:  AMOXIL Take 1 capsule (500 mg total) by mouth 2 (two) times daily.  Indication:  Infection of the Skin and/or Skin Structures   hydrOXYzine 25 MG tablet Commonly known as:  ATARAX/VISTARIL Take 1  tablet (25 mg total) by mouth 3 (three) times daily as needed for anxiety.  Indication:  Anxiety Neurosis   ibuprofen 800 MG tablet Commonly known as:  ADVIL,MOTRIN Take 1 tablet (800 mg total) by mouth every 8 (eight) hours as needed.  Indication:  Inflammation   metFORMIN 500 MG tablet Commonly known as:  GLUCOPHAGE Take 500 mg by mouth 2 (two) times daily with a meal.  Indication:  Type 2 Diabetes   paliperidone 6 MG 24 hr tablet Commonly known as:  INVEGA Take 1 tablet (6 mg total) by mouth at bedtime.  Indication:  Schizophrenia   traZODone 100 MG tablet Commonly known as:  DESYREL Take 1 tablet (100 mg total) by mouth at bedtime as needed for sleep.  Indication:  [place] Follow up.   Why:  Please arrive to the walk-in clinic between the hours of 8am-2:30pm for an assessment for medication management and therapy.  Arrive as early as possible for prompt service.  Please call Sherrian Divers at [phone] for questions and assistance. Contact information: [address] [place] [address] [phone]           Follow-up recommendations:  Activity:  as tolerated. Diet:  low sodium heart healthy ADA fiet. Other:  keep follow up appointments.  Comments:    Signed: Orson Slick, MD 12/11/2015, 9:00 AM

## 2015-12-11 NOTE — Discharge Summary (Signed)
Physician Discharge Summary Note  Patient:  Shari [name] is an 20 y.o., female MRN:  [id number] DOB:  [birth year] Patient phone:  [phone] (home)  Patient address:   [address] [place] [address],  Total Time spent with patient: 30 minutes  Date of Admission:  12/06/2015 Date of Discharge: 12/11/2015  Reason for Admission:  Psychotic break.  Identifying data. Mr. Dejardin is a 20 year old female with a history of depression.  Chief complaint. "I don't want anything bad to happen to you."  History of present illness. Information was obtained from the patient and the chart. The patient has a history of depression and has been treated with Celexa for the past year or so. For the past 3 days the patient started experiencing auditory hallucinations that are command in nature. She is not forthcoming about the context of her voices as she fears that he may hurt her family and even [place] in the hospital. Apparently in the past several weeks she was suspicious of her coworker thinking that this person may be a devil in disguise. She is paranoid and very frightened. She denies current symptoms of depression but admits that in the past she had intrusive thoughts of suicide and most likely to overdose of medication at some point. She was not hospitalized for that. She does not report heightened anxiety with her first panic attack on the day of admission but no symptoms suggestive of PTSD or OCD. She does not drink or use alcohol. Yesterday she was started on Abilify for psychosis but could not tolerate it due to vomiting. She was weak and diaphoretic and has to be assisted to her room.  Past psychiatric history. She was prescribed Celexa for depression in the past year or so but was not consistently taking it. She did not feel it was making a difference. She denies previous hospitalizations. In the past she was drinking a little but never thought this was a problem. She stopped drinking when she  was prescribed medications.  Family psychiatric history. Mother with mental illness on medications, rather with PTSD, uncle with schizophrenia.   Social history. She is single. She is in a lesbian relationship. She lives with her family. 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 She likes her job but is very anxious talking to her clients. She has high anxiety before going to work.   Principal Problem: Schizophrenia spectrum disorder with psychotic disorder type not yet determined Discharge Diagnoses: Patient Active Problem List   Diagnosis Date Noted  . Asthma [J45.909] 12/07/2015  . Diabetes mellitus without complication ([place]) A999333 12/06/2015  . Obesity [E66.9] 12/06/2015  . Chronic pain [G89.29] 12/06/2015  . Schizophrenia spectrum disorder with psychotic disorder type not yet determined [F29] 12/06/2015     Past Medical History:  Past Medical History:  Diagnosis Date  . Asthma   . Diabetes mellitus without complication [facility])     Past Surgical History:  Procedure Laterality Date  . EYE SURGERY    . TONSILLECTOMY     Family History: History reviewed. No pertinent family history.  Social History:  History  Alcohol Use No     History  Drug Use No    Social History   Social History  . Marital status: Single    Spouse name: N/A  . Number of children: N/A  . Years of education: N/A   Social History Main Topics  . Smoking status: Never Smoker  . Smokeless tobacco: Never Used  . Alcohol use  No  . Drug use: No  . Sexual activity: Not Asked   Other Topics Concern  . None   Social History Narrative  . None    Hospital Course:    [name] is a 20 year old female with a history of depression admitted for new onset psychosis.  1. Psychosis. She was started on Abilify but could not tolerate it due to vomitting and was switched to [facility] which she tolerated well.   2. Anxiety. Vistaril was available.  3. Insomnia. Trazodone was  available.  4. Diabetes. She was on metformin, ADA diet, blood glucose monitoring.   5. Metabolic syndrome monitoring. Lipid panel shows elevated cholesterol. TSH and hemoglobin A1c are normal. PRL 14.  6. EKG. Normal sinus rhythm.  7. Tooth ache. She was started on amoxicillin at 500 mg twice daily for 7 days.  8. Disposition. She was discharged home with family. She will follow up with [facility].   Physical Findings: AIMS:  , ,  ,  ,    CIWA:    COWS:     Musculoskeletal: Strength & Muscle Tone: within normal limits Gait & Station: normal Patient leans: N/A  Psychiatric Specialty Exam: Physical Exam  Nursing note and vitals reviewed.   Review of Systems  All other systems reviewed and are negative.   Blood pressure 124/73, pulse (!) 105, temperature 98.3 F (36.8 C), resp. rate 18, height 5\' 8"  (1.727 m), weight 136.1 kg (300 lb), last menstrual period 11/15/2015, SpO2 100 %.Body mass index is 45.61 kg/m.  General Appearance: Casual  Eye Contact:  Good  Speech:  Clear and Coherent  Volume:  Normal  Mood:  Anxious  Affect:  Appropriate  Thought Process:  Goal Directed and Descriptions of Associations: Intact  Orientation:  Full (Time, Place, and Person)  Thought Content:  WDL  Suicidal Thoughts:  No  Homicidal Thoughts:  No  Memory:  Immediate;   Fair Recent;   Fair Remote;   Fair  Judgement:  Fair  Insight:  Fair  Psychomotor Activity:  Normal  Concentration:  Concentration: Fair and Attention Span: Fair  Recall:  [employer] of Knowledge:  Fair  Language:  Fair  Akathisia:  No  Handed:  Right  AIMS (if indicated):     Assets:  Communication Skills Desire for Improvement Financial Resources/Insurance [place] Talents/Skills Transportation Vocational/Educational  ADL's:  Intact  Cognition:  WNL  Sleep:  Number of Hours: 7.45     Have you used any form of tobacco in the last 30 days?  (Cigarettes, Smokeless Tobacco, Cigars, and/or Pipes): No  Has this patient used any form of tobacco in the last 30 days? (Cigarettes, Smokeless Tobacco, Cigars, and/or Pipes) Yes, No  Blood Alcohol level:  Lab Results  Component Value Date   ETH <5 A999333    Metabolic Disorder Labs:  Lab Results  Component Value Date   HGBA1C 6.0 (H) 12/07/2015   MPG 126 12/07/2015   Lab Results  Component Value Date   PROLACTIN 14.1 12/07/2015   Lab Results  Component Value Date   CHOL 321 (H) 12/07/2015   TRIG 71 12/07/2015   HDL 41 12/07/2015   CHOLHDL 7.8 12/07/2015   VLDL 14 12/07/2015   LDLCALC 266 (H) 12/07/2015    See Psychiatric Specialty Exam and Suicide Risk Assessment completed by Attending Physician prior to discharge.  Discharge destination:  Home  Is patient on multiple antipsychotic therapies at discharge:  No   Has  Patient had three or more failed trials of antipsychotic monotherapy by history:  No  Recommended Plan for Multiple Antipsychotic Therapies: NA  Discharge Instructions    Diet - low sodium heart healthy    Complete by:  As directed    Increase activity slowly    Complete by:  As directed        Medication List    STOP taking these medications   gabapentin 100 MG capsule Commonly known as:  NEURONTIN     TAKE these medications     Indication  albuterol 108 (90 Base) MCG/ACT inhaler Commonly known as:  PROVENTIL HFA;VENTOLIN HFA Inhale 2 puffs into the lungs every 6 (six) hours as needed for wheezing or shortness of breath. What changed:  Another medication with the same name was removed. Continue taking this medication, and follow the directions you see here.  Indication:  Asthma   amoxicillin 500 MG capsule Commonly known as:  AMOXIL Take 1 capsule (500 mg total) by mouth 2 (two) times daily.  Indication:  Infection of the Skin and Skin Structures   hydrOXYzine 25 MG tablet Commonly known as:  ATARAX/VISTARIL Take 1 tablet (25 mg  total) by mouth 3 (three) times daily as needed for anxiety.  Indication:  Anxiety Neurosis   ibuprofen 800 MG tablet Commonly known as:  ADVIL,MOTRIN Take 1 tablet (800 mg total) by mouth every 8 (eight) hours as needed.  Indication:  Inflammation   metFORMIN 500 MG tablet Commonly known as:  GLUCOPHAGE Take 500 mg by mouth 2 (two) times daily with a meal.  Indication:  Type 2 Diabetes   paliperidone 6 MG 24 hr tablet Commonly known as:  INVEGA Take 1 tablet (6 mg total) by mouth at bedtime.  Indication:  Schizophrenia   traZODone 100 MG tablet Commonly known as:  DESYREL Take 1 tablet (100 mg total) by mouth at bedtime as needed for sleep.  Indication:  [place] Follow up.   Why:  Please arrive to the walk-in clinic between the hours of 8am-2:30pm for an assessment for medication management and therapy.  Arrive as early as possible for prompt service.  Please call Sherrian Divers at [phone] for questions and assistance. Contact information: [address] [place] [address] [phone]           Follow-up recommendations:  Activity:  as tolerated. Diet:  low sodium heart healthy ADA fiet. Other:  keep follow up appointments.  Comments:    Signed: Orson Slick, MD 12/11/2015, 5:20 AM

## 2015-12-11 NOTE — Progress Notes (Signed)
  [facility] Adult Case Management Discharge Plan :  Will you be returning to the same living situation after discharge:  Yes,  pt will be returning to [facility] to live with her mother At discharge, do you have transportation home?: Yes,  pt will be picked up by her mother Do you have the ability to pay for your medications: Yes,  pt will be provided with prescriptions at discharge  Release of information consent forms completed and in the chart;  Patient's signature needed at discharge.  Patient to Follow up at: [facility] Follow up.   Why:  Please arrive to the walk-in clinic at 7:30am on Wednesday November 15th for an assessment for medication management and therapy.  Arrive as early as possible for prompt service.  Please call Shari [name] at [phone] for questions and assistance. Contact information: [place]  [address] [phone]           Next level of care provider has access to [place] and Suicide Prevention discussed: Yes,  completed with pt  Have you used any form of tobacco in the last 30 days? (Cigarettes, Smokeless Tobacco, Cigars, and/or Pipes): No  Has patient been referred to the Quitline?: N/A patient is not a smoker  Patient has been referred for addiction treatment: N/A  Shari [name] 12/11/2015, 2:03 PM

## 2015-12-11 NOTE — Progress Notes (Signed)
Patient stayed in the milieu, pleasant and cooperative. Denied SI and hallucinations. Stated that she had many visitors and feels supported. Attended group, had a snack and had bed time medications. CBG 88.  Continues to express readiness for discharge. Currently sleeping quietly. Staff continue to monitor for safety.

## 2015-12-11 NOTE — [facility] Suicide Risk Assessment (Signed)
[place] INPATIENT:  Family/Significant Other Suicide Prevention Education  Suicide Prevention Education:  Patient Refusal for Family/Significant Other Suicide Prevention Education: The patient Aliyanah Coney has refused to provide written consent for family/significant other to be provided Family/Significant Other Suicide Prevention Education during admission and/or prior to discharge.  Physician notified.  CSW completed SPE with the pt.    Alphonse Guild Davi Rotan 12/11/2015, 1:59 PM

## 2015-12-30 DIAGNOSIS — E221 Hyperprolactinemia: Secondary | ICD-10-CM | POA: Insufficient documentation

## 2016-01-07 ENCOUNTER — Other Ambulatory Visit: Payer: Self-pay | Admitting: Psychiatry

## 2016-09-30 ENCOUNTER — Encounter: Payer: Self-pay | Admitting: Emergency Medicine

## 2016-09-30 ENCOUNTER — Emergency Department: Payer: BLUE CROSS/BLUE SHIELD

## 2016-09-30 ENCOUNTER — Emergency Department
Admission: EM | Admit: 2016-09-30 | Discharge: 2016-09-30 | Disposition: A | Discharge status: Home / Self Care | Payer: BLUE CROSS/BLUE SHIELD | Attending: Emergency Medicine | Admitting: Emergency Medicine

## 2016-09-30 DIAGNOSIS — Z79899 Other long term (current) drug therapy: Secondary | ICD-10-CM | POA: Insufficient documentation

## 2016-09-30 DIAGNOSIS — S3992XA Unspecified injury of lower back, initial encounter: Secondary | ICD-10-CM | POA: Diagnosis present

## 2016-09-30 DIAGNOSIS — E119 Type 2 diabetes mellitus without complications: Secondary | ICD-10-CM | POA: Insufficient documentation

## 2016-09-30 DIAGNOSIS — Z7984 Long term (current) use of oral hypoglycemic drugs: Secondary | ICD-10-CM | POA: Insufficient documentation

## 2016-09-30 DIAGNOSIS — Y9389 Activity, other specified: Secondary | ICD-10-CM | POA: Diagnosis not present

## 2016-09-30 DIAGNOSIS — J45909 Unspecified asthma, uncomplicated: Secondary | ICD-10-CM | POA: Diagnosis not present

## 2016-09-30 DIAGNOSIS — S39012A Strain of muscle, fascia and tendon of lower back, initial encounter: Secondary | ICD-10-CM | POA: Diagnosis not present

## 2016-09-30 DIAGNOSIS — Y999 Unspecified external cause status: Secondary | ICD-10-CM | POA: Insufficient documentation

## 2016-09-30 DIAGNOSIS — Y92410 Unspecified street and highway as the place of occurrence of the external cause: Secondary | ICD-10-CM | POA: Insufficient documentation

## 2016-09-30 LAB — POCT PREGNANCY, URINE: PREG TEST UR: NEGATIVE

## 2016-09-30 MED ORDER — METHOCARBAMOL 500 MG PO TABS: 500.0000 mg | ORAL_TABLET | Freq: Four times a day (QID) | ORAL | 0 refills | Status: DC

## 2016-09-30 MED ORDER — IBUPROFEN 600 MG PO TABS: 600.0000 mg | ORAL_TABLET | Freq: Three times a day (TID) | ORAL | 0 refills | Status: DC | PRN
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
## 2016-09-30 NOTE — Discharge Instructions (Signed)
Follow-up with your primary care doctor at [facility] clinic if any continued problems. Begin using warm compresses or ice packs to her lower back as needed for comfort. Begin taking ibuprofen 600 mg 3 times a day with food. Robaxin 500 mg 1 tablet  4 times a day as needed for muscle spasms. Did not take the muscle relaxant Robaxin while driving or operating machinery as this could cause drowsiness and increase your risk for injury.
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
## 2016-11-13 ENCOUNTER — Ambulatory Visit: Payer: BLUE CROSS/BLUE SHIELD | Admitting: Psychiatry

## 2016-11-20 ENCOUNTER — Ambulatory Visit: Payer: BLUE CROSS/BLUE SHIELD | Admitting: Psychiatry

## 2016-11-26 ENCOUNTER — Encounter: Payer: Self-pay | Admitting: Psychiatry

## 2016-11-26 ENCOUNTER — Ambulatory Visit (INDEPENDENT_AMBULATORY_CARE_PROVIDER_SITE_OTHER): Payer: BLUE CROSS/BLUE SHIELD | Admitting: Psychiatry

## 2016-11-26 VITALS — BP 137/84 | HR 89 | Temp 98.6°F | Ht 68.11 in | Wt 297.0 lb

## 2016-11-26 DIAGNOSIS — F331 Major depressive disorder, recurrent, moderate: Secondary | ICD-10-CM | POA: Diagnosis not present

## 2016-11-26 MED ORDER — SERTRALINE HCL 25 MG PO TABS: 25.0000 mg | ORAL_TABLET | Freq: Every day | ORAL | 1 refills | Status: DC
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
## 2016-12-10 ENCOUNTER — Ambulatory Visit: Payer: BLUE CROSS/BLUE SHIELD | Admitting: Psychiatry

## 2017-01-27 ENCOUNTER — Emergency Department
Admission: EM | Admit: 2017-01-27 | Discharge: 2017-01-27 | Disposition: A | Discharge status: Home / Self Care | Payer: BLUE CROSS/BLUE SHIELD | Attending: Emergency Medicine | Admitting: Emergency Medicine

## 2017-01-27 ENCOUNTER — Other Ambulatory Visit: Payer: Self-pay

## 2017-01-27 ENCOUNTER — Emergency Department: Payer: BLUE CROSS/BLUE SHIELD

## 2017-01-27 ENCOUNTER — Encounter: Payer: Self-pay | Admitting: Emergency Medicine

## 2017-01-27 DIAGNOSIS — R509 Fever, unspecified: Secondary | ICD-10-CM | POA: Diagnosis present

## 2017-01-27 DIAGNOSIS — G8929 Other chronic pain: Secondary | ICD-10-CM | POA: Insufficient documentation

## 2017-01-27 DIAGNOSIS — E119 Type 2 diabetes mellitus without complications: Secondary | ICD-10-CM | POA: Insufficient documentation

## 2017-01-27 DIAGNOSIS — Z7984 Long term (current) use of oral hypoglycemic drugs: Secondary | ICD-10-CM | POA: Insufficient documentation

## 2017-01-27 DIAGNOSIS — J189 Pneumonia, unspecified organism: Secondary | ICD-10-CM

## 2017-01-27 DIAGNOSIS — Z79899 Other long term (current) drug therapy: Secondary | ICD-10-CM | POA: Insufficient documentation

## 2017-01-27 DIAGNOSIS — J45909 Unspecified asthma, uncomplicated: Secondary | ICD-10-CM | POA: Insufficient documentation

## 2017-01-27 DIAGNOSIS — J181 Lobar pneumonia, unspecified organism: Secondary | ICD-10-CM | POA: Insufficient documentation

## 2017-01-27 LAB — COMPREHENSIVE METABOLIC PANEL
ALT: 12 U/L — AB (ref 14–54)
AST: 17 U/L (ref 15–41)
Albumin: 3.3 g/dL — ABNORMAL LOW (ref 3.5–5.0)
Alkaline Phosphatase: 69 U/L (ref 38–126)
Anion gap: 7 (ref 5–15)
BUN: 13 mg/dL (ref 6–20)
CHLORIDE: 106 mmol/L (ref 101–111)
CO2: 22 mmol/L (ref 22–32)
CREATININE: 0.62 mg/dL (ref 0.44–1.00)
Calcium: 8.2 mg/dL — ABNORMAL LOW (ref 8.9–10.3)
GFR calc Af Amer: >60 mL/min (ref >60)
GFR calc non Af Amer: >60 mL/min (ref >60)
Glucose, Bld: 91 mg/dL (ref 65–99)
Potassium: 3.7 mmol/L (ref 3.5–5.1)
SODIUM: 135 mmol/L (ref 135–145)
Total Bilirubin: 0.4 mg/dL (ref 0.3–1.2)
Total Protein: 7.8 g/dL (ref 6.5–8.1)

## 2017-01-27 LAB — CBC WITH DIFFERENTIAL/PLATELET
BASOS ABS: 0.1 10*3/uL (ref 0–0.1)
Basophils Relative: 0 %
EOS PCT: 2 %
Eosinophils Absolute: 0.4 10*3/uL (ref 0–0.7)
HCT: 31.6 % — ABNORMAL LOW (ref 35.0–47.0)
HEMOGLOBIN: 10.2 g/dL — AB (ref 12.0–16.0)
Lymphocytes Relative: 13 %
Lymphs Abs: 2.3 10*3/uL (ref 1.0–3.6)
MCH: 25.6 pg — ABNORMAL LOW (ref 26.0–34.0)
MCHC: 32.3 g/dL (ref 32.0–36.0)
MCV: 79.3 fL — AB (ref 80.0–100.0)
Monocytes Absolute: 0.8 10*3/uL (ref 0.2–0.9)
Monocytes Relative: 5 %
NEUTROS PCT: 80 %
Neutro Abs: 13.6 10*3/uL — ABNORMAL HIGH (ref 1.4–6.5)
PLATELETS: 419 10*3/uL (ref 150–440)
RBC: 3.99 MIL/uL (ref 3.80–5.20)
RDW: 17.5 % — ABNORMAL HIGH (ref 11.5–14.5)
WBC: 17.1 10*3/uL — AB (ref 3.6–11.0)

## 2017-01-27 LAB — URINALYSIS, COMPLETE (UACMP) WITH MICROSCOPIC
Bilirubin Urine: NEGATIVE
Glucose, UA: NEGATIVE mg/dL
HGB URINE DIPSTICK: NEGATIVE
Ketones, ur: NEGATIVE mg/dL
NITRITE: NEGATIVE
PROTEIN: NEGATIVE mg/dL
Specific Gravity, Urine: 1.017 (ref 1.005–1.030)
pH: 5 (ref 5.0–8.0)

## 2017-01-27 LAB — POCT PREGNANCY, URINE: Preg Test, Ur: NEGATIVE

## 2017-01-27 MED ORDER — DEXTROSE 5 % IV SOLN
1.0000 g | Freq: Once | INTRAVENOUS | Status: AC
  Administered 2017-01-27: 1 g via INTRAVENOUS
  Filled 2017-01-27: qty 10

## 2017-01-27 MED ORDER — SODIUM CHLORIDE 0.9 % IV BOLUS (SEPSIS)
1000.0000 mL | Freq: Once | INTRAVENOUS | Status: AC
  Administered 2017-01-27: 1000 mL via INTRAVENOUS

## 2017-01-27 MED ORDER — HYDROCOD POLST-CPM POLST ER 10-8 MG/5ML PO SUER
5.0000 mL | Freq: Two times a day (BID) | ORAL | 0 refills | Status: DC | PRN
Start: 2017-01-27 — End: 2017-02-19

## 2017-01-27 MED ORDER — ACETAMINOPHEN 325 MG PO TABS
650.0000 mg | ORAL_TABLET | Freq: Once | ORAL | Status: AC | PRN
  Administered 2017-01-27: 650 mg via ORAL

## 2017-01-27 MED ORDER — AZITHROMYCIN 250 MG PO TABS: ORAL_TABLET | ORAL | 0 refills | Status: DC

## 2017-01-27 MED ORDER — ACETAMINOPHEN 325 MG PO TABS
ORAL_TABLET | ORAL | Status: AC
  Filled 2017-01-27: qty 2

## 2017-01-27 NOTE — ED Provider Notes (Signed)
[facility] Emergency Department Provider Note   ____________________________________________   First MD Initiated Contact with Patient 01/27/17 1511     (approximate)  I have reviewed the triage vital signs and the nursing notes.   HISTORY  Chief Complaint Generalized Body Aches; Fever; and Shortness of Breath   HPI Shari [name] is a 21 y.o. female is here with complaint of generalized body aches, cough, fever, shortness of breath. Patient states that all her symptoms began December 9 and has continued. She states that she was seen once at [facility] in which time she was placed on antibiotic and cough medication. She continues to have fever at home. She states that she is uncertain what she was being treated for during her office visit. She rates her pain as 10 over 10.  Looking at her record from [place]  [facility] appears that she was treated with Bromfed DM and Vibra-Tabs.   Past Medical History:  Diagnosis Date  . Asthma   . Diabetes mellitus without complication [facility])     Patient Active Problem List   Diagnosis Date Noted  . Asthma 12/07/2015  . Diabetes mellitus without complication ([place]) 03/47/4259  . Obesity 12/06/2015  . Chronic pain 12/06/2015  . Schizophrenia spectrum disorder with psychotic disorder type not yet determined ([place]) 12/06/2015  . Pain in both feet 05/02/2015  . 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 Female pelvic pain 06/04/2012  . Pelvic and perineal pain 06/04/2012  . Incomplete emptying of bladder 12/13/2011  . Retention of urine 12/13/2011  . Symptoms involving urinary system 12/13/2011  . Urge incontinence 12/13/2011  . Increased frequency of urination 12/13/2011  . 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 Regular astigmatism 05/07/2010    Past Surgical History:  Procedure Laterality Date  . EYE SURGERY    . TONSILLECTOMY      Prior to Admission medications   Medication Sig Start Date End Date Taking? Authorizing  Provider  albuterol (PROVENTIL HFA;VENTOLIN HFA) 108 (90 BASE) MCG/ACT inhaler Inhale 2 puffs into the lungs every 6 (six) hours as needed for wheezing or shortness of breath. 07/30/14   Triplett, Cari B, FNP  azithromycin (ZITHROMAX Z-PAK) 250 MG tablet Take 2 tablets (500 mg) on  Day 1,  followed by 1 tablet (250 mg) once daily on Days 2 through 5. 01/27/17   Johnn Hai, PA-C  chlorpheniramine-HYDROcodone (TUSSIONEX PENNKINETIC ER) 10-8 MG/5ML SUER Take 5 mLs by mouth every 12 (twelve) hours as needed for cough. 01/27/17   Johnn Hai, PA-C  ibuprofen (ADVIL,MOTRIN) 600 MG tablet Take 1 tablet (600 mg total) by mouth every 8 (eight) hours as needed. 09/30/16   Johnn Hai, PA-C  metFORMIN (GLUCOPHAGE) 500 MG tablet Take 500 mg by mouth 2 (two) times daily with a meal.    [provider]  sertraline (ZOLOFT) 25 MG tablet Take 1 tablet (25 mg total) by mouth daily. 11/26/16 11/26/17  Elvin So, MD    Allergies Tramadol  Family History  Problem Relation Age of Onset  . Anxiety disorder Mother   . Depression Mother   . Alcohol abuse Father   . Drug abuse Father   . Schizophrenia Maternal Uncle     Social History Social History   Tobacco Use  . Smoking status: Never Smoker  . Smokeless tobacco: Never Used  Substance Use Topics  . Alcohol use: No  . Drug use: No    Review of Systems Constitutional: positive subjective fever/chills Eyes: No visual changes. ENT: No  sore throat. Cardiovascular: Denies chest pain. Respiratory: positive shortness of breath. Positive cough. Gastrointestinal: No abdominal pain.  No nausea, no vomiting.  No diarrhea.  No constipation. Genitourinary: Negative for dysuria. Musculoskeletal: Negative for back pain. Skin: Negative for rash. Neurological: Negative for headaches, focal weakness or numbness. ____________________________________________   PHYSICAL EXAM:  VITAL SIGNS: ED Triage Vitals [01/27/17 1344]  Enc  Vitals Group     BP (!) 150/69     Pulse Rate (!) 120     Resp (!) 22     Temp (!) 100.9 F (38.3 C)     Temp Source Oral     SpO2 98 %     Weight 296 lb (134.3 kg)     Height 5\' 8"  (1.727 m)     Head Circumference      Peak Flow      Pain Score 10     Pain Loc      Pain Edu?      Excl. in [place]?     Constitutional: Alert and oriented. Well appearing and in no acute distress. Patient is able to speak in complete sentences without any difficulty. Eyes: Conjunctivae are normal.  Head: Atraumatic. Nose: No congestion/rhinnorhea. Mouth/Throat: Mucous membranes are moist.  Oropharynx non-erythematous. Neck: No stridor.   Hematological/Lymphatic/Immunilogical: No cervical lymphadenopathy. Cardiovascular: Normal rate, regular rhythm. Grossly normal heart sounds.  Good peripheral circulation. Respiratory: Normal respiratory effort.  No retractions. Lungs CTAB.  Gastrointestinal: Soft and nontender. No distention.  Musculoskeletal: No lower extremity tenderness nor edema.  No joint effusions. Neurologic:  Normal speech and language. No gross focal neurologic deficits are appreciated.  Skin:  Skin is warm, dry and intact. No rash noted. Psychiatric: Mood and affect are normal. Speech and behavior are normal.  ____________________________________________   LABS (all labs ordered are listed, but only abnormal results are displayed)  Labs Reviewed  URINALYSIS, COMPLETE (UACMP) WITH MICROSCOPIC - Abnormal; Notable for the following components:      Result Value   Color, Urine YELLOW (*)    APPearance HAZY (*)    Leukocytes, UA SMALL (*)    Bacteria, UA RARE (*)    Squamous Epithelial / LPF 6-30 (*)    All other components within normal limits  CBC WITH DIFFERENTIAL/PLATELET - Abnormal; Notable for the following components:   WBC 17.1 (*)    Hemoglobin 10.2 (*)    HCT 31.6 (*)    MCV 79.3 (*)    MCH 25.6 (*)    RDW 17.5 (*)    Neutro Abs 13.6 (*)    All other components within  normal limits  COMPREHENSIVE METABOLIC PANEL - Abnormal; Notable for the following components:   Calcium 8.2 (*)    Albumin 3.3 (*)    ALT 12 (*)    All other components within normal limits  POC URINE PREG, ED  POCT PREGNANCY, URINE    RADIOLOGY  Dg Chest 2 View  Result Date: 01/27/2017 CLINICAL DATA:  Generalized body aches fever cough and chest and back pain associated with shortness of breath. History of asthma, nonsmoker, diabetes. EXAM: CHEST  2 VIEW COMPARISON:  Chest x-ray of July 30, 2014 FINDINGS: There is patchy interstitial and alveolar opacity in the right mid and lower lung. There are coarse lung markings in the lingula. The heart and pulmonary vascularity are normal. There is no pleural effusion or pneumothorax. The bony thorax is unremarkable. IMPRESSION: Bilateral pneumonia greatest on the right. Followup PA and lateral chest X-ray is  recommended in 3-4 weeks following trial of antibiotic therapy to ensure resolution. Electronically Signed   By: David  Martinique M.D.   On: 01/27/2017 16:45   EKG: Sinus tachycardia with ventricular rate of 122. Reviewed by Dr. Jacqualine Code. ____________________________________________   PROCEDURES  Procedure(s) performed: None  Procedures  Critical Care performed: No  ____________________________________________   INITIAL IMPRESSION / ASSESSMENT AND PLAN / ED COURSE Chest x-ray confirmed the patient has pneumonia. Patient was given Rocephin 1 g IV while in the department along with a bolus of normal saline. Patient vital signs improved and patient was ambulatory to the restroom without assistance. Patient is continue with antibiotics and was given a prescription for Zithromax for the next 5 days along with prescription for Tussionex as needed for cough. She is aware that she cannot drive while taking this medication. She is also given a note to remain out of work the remainder of the week. She'll follow-up with Dr. Lennox Grumbles for her  pneumonia. ____________________________________________   FINAL CLINICAL IMPRESSION(S) / ED DIAGNOSES  Final diagnoses:  Pneumonia of both lower lobes due to infectious organism [facility])     ED Discharge Orders        Ordered    azithromycin (ZITHROMAX Z-PAK) 250 MG tablet     01/27/17 1847    chlorpheniramine-HYDROcodone (TUSSIONEX PENNKINETIC ER) 10-8 MG/5ML SUER  Every 12 hours PRN     01/27/17 1847       Note:  This document was prepared using Dragon voice recognition software and may include unintentional dictation errors.    Johnn Hai, PA-C 01/27/17 Marko Stai    Delman Kitten, MD 01/27/17 [phone]

## 2017-01-27 NOTE — Discharge Instructions (Signed)
Follow-up with your primary care provider. Call and make an appointment. Increase fluids. Tylenol or ibuprofen as needed for fever and body aches. Take medication as directed. Given Zithromax for the next 5 days. Also take Tussionex 1 teaspoon every 12 hours as needed for cough. This medication could cause drowsiness. Do not drive or operate machinery while taking this medication.

## 2017-01-27 NOTE — ED Notes (Signed)
See triage note  States she has been having generalized body ,fever and cough for a couple of days   Was recently treated for ear infection  Was febrile on arrival

## 2017-01-27 NOTE — ED Triage Notes (Signed)
Pt presents to ED with c/o generalized body aches, cough, fever, chest pain, shortness of breath. Pt states pain worse with deep inspiration at this time. Pt states was treated on 12/9 with medication, states pain worse now. Pt is alert and oriented at this time. Noted to be tachycardic at this time. States these symptoms started approx 1 week ago.

## 2017-01-28 DIAGNOSIS — J189 Pneumonia, unspecified organism: Secondary | ICD-10-CM

## 2017-01-28 HISTORY — DX: Pneumonia, unspecified organism: J18.9

## 2017-02-08 ENCOUNTER — Other Ambulatory Visit: Payer: Self-pay | Admitting: Psychiatry

## 2017-02-19 ENCOUNTER — Ambulatory Visit: Payer: BLUE CROSS/BLUE SHIELD | Admitting: Psychiatry

## 2017-02-19 ENCOUNTER — Encounter: Payer: Self-pay | Admitting: Psychiatry

## 2017-02-19 ENCOUNTER — Other Ambulatory Visit: Payer: Self-pay

## 2017-02-19 VITALS — BP 140/74 | HR 93 | Temp 98.7°F | Wt 293.0 lb

## 2017-02-19 DIAGNOSIS — F331 Major depressive disorder, recurrent, moderate: Secondary | ICD-10-CM

## 2017-02-19 MED ORDER — TRAZODONE HCL 50 MG PO TABS: 50.0000 mg | ORAL_TABLET | Freq: Every day | ORAL | 1 refills | Status: DC

## 2017-02-19 MED ORDER — SERTRALINE HCL 25 MG PO TABS: 25.0000 mg | ORAL_TABLET | Freq: Every day | ORAL | 1 refills | Status: DC

## 2017-02-19 NOTE — Progress Notes (Signed)
Psychiatric progress note  Patient Identification: Shari [name] MRN:  [id number] Date of Evaluation:  02/19/2017 Referral Source: Uvaldo Bristle- flowers LCSWA Chief Complaint:   Chief Complaint    Follow-up; Medication Refill    anxiety Visit Diagnosis:    ICD-10-CM   1. MDD (major depressive disorder), recurrent episode, moderate (HCC) F33.1     History of Present Illness:  Patient is a 22 year old African-American female who presents today for follow up of MDD. Patient reports she did well on the zoloft. She was not as stressed at school and her anxiety was under control. She ran out of the zoloft recently and not taking for 3 weeks. States her anxiety has been bad, gets flustered easily, unable to focus, easily overwhelmed. Not sleeping well. Eating ok. Denies any suicidal thoughts.   Past Psychiatric History: was seeing a psychiatrist at [facility]  Previous Psychotropic Medications: Yes   Substance Abuse History in the last 12 months:  No.  Consequences of Substance Abuse: Negative  Past Medical History:  Past Medical History:  Diagnosis Date  . Asthma   . Diabetes mellitus without complication [facility])     Past Surgical History:  Procedure Laterality Date  . EYE SURGERY    . TONSILLECTOMY      Family Psychiatric History: Mother has anxiety, other family members have anxiety.  Family History:  Family History  Problem Relation Age of Onset  . Anxiety disorder Mother   . Depression Mother   . Alcohol abuse Father   . Drug abuse Father   . Schizophrenia Maternal Uncle     Social History:   Social History   Socioeconomic History  . Marital status: Single    Spouse name: None  . Number of children: 0  . Years of education: None  . Highest education level: Associate degree: occupational, Hotel manager, or vocational program  Social Needs  . Financial resource strain: Somewhat hard  . Food insecurity - worry: Never true  . Food insecurity - inability: Never true  .  Transportation needs - medical: No  . Transportation needs - non-medical: No  Occupational History  . None  Tobacco Use  . Smoking status: Never Smoker  . Smokeless tobacco: Never Used  Substance and Sexual Activity  . Alcohol use: No  . Drug use: No  . Sexual activity: Yes  Other Topics Concern  . None  Social History Narrative  . None    Additional Social History: Lives with her mother and grandparents  Allergies:   Allergies  Allergen Reactions  . Tramadol Other (See Comments)    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    Metabolic Disorder Labs: Lab Results  Component Value Date   HGBA1C 6.0 (H) 12/07/2015   MPG 126 12/07/2015   Lab Results  Component Value Date   PROLACTIN 14.1 12/07/2015   Lab Results  Component Value Date   CHOL 321 (H) 12/07/2015   TRIG 71 12/07/2015   HDL 41 12/07/2015   CHOLHDL 7.8 12/07/2015   VLDL 14 12/07/2015   LDLCALC 266 (H) 12/07/2015     Current Medications: Current Outpatient Medications  Medication Sig Dispense Refill  . albuterol (PROVENTIL HFA;VENTOLIN HFA) 108 (90 BASE) MCG/ACT inhaler Inhale 2 puffs into the lungs every 6 (six) hours as needed for wheezing or shortness of breath. 1 Inhaler 2  . etonogestrel (NEXPLANON) 68 MG IMPL implant 68 mg.    . sertraline (ZOLOFT) 25 MG tablet Take 1 tablet (25 mg total) by mouth daily. 30 tablet  1  . traZODone (DESYREL) 50 MG tablet Take 1 tablet (50 mg total) by mouth at bedtime. 30 tablet 1   No current facility-administered medications for this visit.     Neurologic: Headache: No Seizure: No Paresthesias:No  Musculoskeletal: Strength & Muscle Tone: within normal limits Gait & Station: normal Patient leans: N/A  Psychiatric Specialty Exam: ROS  Blood pressure 140/74, pulse 93, temperature 98.7 F (37.1 C), temperature source Oral, weight 132.9 kg (293 lb).Body mass index is 44.55 kg/m.  General Appearance: Casual  Eye Contact:  Good  Speech:  Clear and Coherent   Volume:  Normal  Mood:  anxious  Affect:  Blunt  Thought Process:  Coherent  Orientation:  Full (Time, Place, and Person)  Thought Content:  Logical  Suicidal Thoughts:  No  Homicidal Thoughts:  No  Memory:  Immediate;   Fair Recent;   Fair Remote;   Fair  Judgement:  Fair  Insight:  Fair  Psychomotor Activity:  Normal  Concentration:  Concentration: Fair and Attention Span: Fair  Recall:  [employer] of Knowledge:Fair  Language: Fair  Akathisia:  No  Handed:  Right  AIMS (if indicated):  na  Assets:  Communication Skills Desire for Improvement Financial Resources/Insurance Housing Physical Health Resilience Social Support Vocational/Educational  ADL's:  Intact  Cognition: WNL  Sleep:  Not good    Treatment Plan Summary: Major depressive disorder moderate  restart Zoloft at 25 mg once daily. Side effects including black box warning of possible suicidal thoughts discussed with patient. She gave verbal consent. Discussed continuing therapy with her current therapist.  Insomnia Start trazodone 50mg  po qhs.  Return to clinic in 4 weeks time or call before if needed.   Elvin So, MD 1/23/20192:22 PM

## 2017-03-17 ENCOUNTER — Other Ambulatory Visit: Payer: Self-pay

## 2017-03-17 ENCOUNTER — Ambulatory Visit: Payer: BLUE CROSS/BLUE SHIELD | Admitting: Psychiatry

## 2017-03-17 ENCOUNTER — Encounter: Payer: Self-pay | Admitting: Psychiatry

## 2017-03-17 VITALS — BP 119/73 | HR 91 | Temp 98.3°F | Wt 299.4 lb

## 2017-03-17 DIAGNOSIS — F331 Major depressive disorder, recurrent, moderate: Secondary | ICD-10-CM

## 2017-03-17 MED ORDER — SERTRALINE HCL 25 MG PO TABS: 25.0000 mg | ORAL_TABLET | Freq: Every day | ORAL | 1 refills | Status: DC

## 2017-03-17 NOTE — Progress Notes (Signed)
Psychiatric progress note  Patient Identification: Shari [name] MRN:  [id number] Date of Evaluation:  03/17/2017 Referral Source: Uvaldo Bristle- flowers LCSWA Chief Complaint:   Chief Complaint    Follow-up; Medication Refill    improved mood Visit Diagnosis:    ICD-10-CM   1. MDD (major depressive disorder), recurrent episode, moderate (HCC) F33.1     History of Present Illness:  Patient is a 22 year old African-American female who presents today for follow up of MDD. patient reports that since she started Zoloft she has been doing better. However today she is somewhat tired because she did not eat all day. States that she is quite busy with her classes and her job as well. However reports her mood overall has been much better. She has started the trazodone but states that she doesn't take it as often. Doing well overall. She does describe 2 episodes of dizziness and we discussed continuing to monitor. Denies any suicidal thoughts.   Past Psychiatric History: was seeing a psychiatrist at [facility]  Previous Psychotropic Medications: Yes   Substance Abuse History in the last 12 months:  No.  Consequences of Substance Abuse: Negative  Past Medical History:  Past Medical History:  Diagnosis Date  . Asthma   . Diabetes mellitus without complication [facility])     Past Surgical History:  Procedure Laterality Date  . EYE SURGERY    . TONSILLECTOMY      Family Psychiatric History: Mother has anxiety, other family members have anxiety.  Family History:  Family History  Problem Relation Age of Onset  . Anxiety disorder Mother   . Depression Mother   . Alcohol abuse Father   . Drug abuse Father   . Schizophrenia Maternal Uncle     Social History:   Social History   Socioeconomic History  . Marital status: Single    Spouse name: None  . Number of children: 0  . Years of education: None  . Highest education level: Associate degree: occupational, Hotel manager, or vocational  program  Social Needs  . Financial resource strain: Somewhat hard  . Food insecurity - worry: Never true  . Food insecurity - inability: Never true  . Transportation needs - medical: No  . Transportation needs - non-medical: No  Occupational History  . None  Tobacco Use  . Smoking status: Never Smoker  . Smokeless tobacco: Never Used  Substance and Sexual Activity  . Alcohol use: No  . Drug use: No  . Sexual activity: Yes  Other Topics Concern  . None  Social History Narrative  . None    Additional Social History: Lives with her mother and grandparents  Allergies:   Allergies  Allergen Reactions  . Tramadol Other (See Comments)    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    Metabolic Disorder Labs: Lab Results  Component Value Date   HGBA1C 6.0 (H) 12/07/2015   MPG 126 12/07/2015   Lab Results  Component Value Date   PROLACTIN 14.1 12/07/2015   Lab Results  Component Value Date   CHOL 321 (H) 12/07/2015   TRIG 71 12/07/2015   HDL 41 12/07/2015   CHOLHDL 7.8 12/07/2015   VLDL 14 12/07/2015   LDLCALC 266 (H) 12/07/2015     Current Medications: Current Outpatient Medications  Medication Sig Dispense Refill  . albuterol (PROVENTIL HFA;VENTOLIN HFA) 108 (90 BASE) MCG/ACT inhaler Inhale 2 puffs into the lungs every 6 (six) hours as needed for wheezing or shortness of breath. 1 Inhaler 2  . etonogestrel (NEXPLANON)  68 MG IMPL implant 68 mg.    . sertraline (ZOLOFT) 25 MG tablet Take 1 tablet (25 mg total) by mouth daily. 30 tablet 1  . traZODone (DESYREL) 50 MG tablet Take 1 tablet (50 mg total) by mouth at bedtime. 30 tablet 1   No current facility-administered medications for this visit.     Neurologic: Headache: No Seizure: No Paresthesias:No  Musculoskeletal: Strength & Muscle Tone: within normal limits Gait & Station: normal Patient leans: N/A  Psychiatric Specialty Exam: ROS  Blood pressure 119/73, pulse 91, temperature 98.3 F (36.8 C),  temperature source Oral, weight 135.8 kg (299 lb 6.4 oz).Body mass index is 45.52 kg/m.  General Appearance: Casual  Eye Contact:  Good  Speech:  Clear and Coherent  Volume:  Normal  Mood:  improved  Affect:  pleasant  Thought Process:  Coherent  Orientation:  Full (Time, Place, and Person)  Thought Content:  Logical  Suicidal Thoughts:  No  Homicidal Thoughts:  No  Memory:  Immediate;   Fair Recent;   Fair Remote;   Fair  Judgement:  Fair  Insight:  Fair  Psychomotor Activity:  Normal  Concentration:  Concentration: Fair and Attention Span: Fair  Recall:  [employer] of Knowledge:Fair  Language: Fair  Akathisia:  No  Handed:  Right  AIMS (if indicated):  na  Assets:  Communication Skills Desire for Improvement Financial Resources/Insurance Housing Physical Health Resilience Social Support Vocational/Educational  ADL's:  Intact  Cognition: WNL  Sleep: better    Treatment Plan Summary: Major depressive disorder moderate  Continue Zoloft at 25 mg once daily. Side effects including black box warning of possible suicidal thoughts discussed with patient. She gave verbal consent. Patient urged to start therapy with her current therapist.  Insomnia Continue trazodone at 50mg  po qhs.  Return to clinic in 2 months time or call before if needed.   Elvin So, MD 2/18/20194:41 PM

## 2017-04-09 ENCOUNTER — Encounter: Payer: Self-pay | Admitting: Obstetrics and Gynecology

## 2017-04-09 ENCOUNTER — Ambulatory Visit: Payer: BLUE CROSS/BLUE SHIELD | Admitting: Obstetrics and Gynecology

## 2017-04-09 VITALS — BP 132/84 | Wt 298.0 lb

## 2017-04-09 DIAGNOSIS — R102 Pelvic and perineal pain: Secondary | ICD-10-CM | POA: Diagnosis not present

## 2017-04-09 NOTE — Progress Notes (Signed)
Obstetrics & Gynecology Office Visit   Chief Complaint  Patient presents with  . Referral  . Pelvic Pain  Referral from [facility], Dr. Beverlyn Roux, MD  History of Present Illness: 22 y.o. G1P0010 who presents in referral from the above provider for pain in her pelvis.  The pain is located in her lower abdomen and pelvis. The pain radiates to her legs and back.  The pain is described as bad cramps, and other times it is like a stabbing.  Her legs are tender to the touch at times.  She has a Nexplanon, which was placed in November. Her bleeding has been irregular.  The pain can occur unprovoked.  She can be woken from sleep with the pain.  Alleviating factors include having a bowel movement (sometimes), or applying pressure to her abdomen or back.  Aggravating factors include: having to void, voiding, when she has the sensation to have a bowel movement and when she has a bowel movement, motion. However, other times it occurs without obvious provocation.  Intercourse also causes the pain.  Arousal and penetration cause the pain.  Putting a pillow under back relieves her pain a little. Position changes.  She has had this type of pain for a while, since age 43.  Associated symptoms include; nausea, vomiting, vaginal bleeding. She can feel sore for a week.  The pain can last for about 10 minutes. Prior to the [place] she tried different types of birth control pills.  When she was younger the pills helped a lot.  Last year when she restarted the pills it did not help.  Nexplanon is so she won't get pregnant.  She denies hematuria, hematochezia, melena.  She a pap smear that was normal. Denies history of abnormal. Denies history of STDs. She states that she is pre-diabetic, instead of diabetic. She takes no medication for this. She denies vaginal symptoms of itching, burning, and irritation.   Past Medical History:  Diagnosis Date  . Asthma   . Diabetes mellitus without complication  ([place])   Depression  Past Surgical History:  Procedure Laterality Date  . EYE SURGERY    . TONSILLECTOMY     Gynecologic History: No LMP recorded. Patient has had an implant.  Obstetric History: G1P0010 G1 SAB, 2015.  [redacted] weeks along.   Family History  Problem Relation Age of Onset  . Anxiety disorder Mother   . Depression Mother   . Alcohol abuse Father   . Drug abuse Father   . Schizophrenia Maternal Uncle     Social History   Socioeconomic History  . Marital status: Single    Spouse name: Not on file  . Number of children: 0  . Years of education: Not on file  . Highest education level: Associate degree: occupational, Hotel manager, or vocational program  Social Needs  . Financial resource strain: Somewhat hard  . Food insecurity - worry: Never true  . Food insecurity - inability: Never true  . Transportation needs - medical: No  . Transportation needs - non-medical: No  Occupational History  . Not on file  Tobacco Use  . Smoking status: Never Smoker  . Smokeless tobacco: Never Used  Substance and Sexual Activity  . Alcohol use: No  . Drug use: No  . Sexual activity: Yes    Birth control/protection: Implant  Other Topics Concern  . Not on file  Social History Narrative  . Not on file    Allergies  Allergen Reactions  . Tramadol  Other (See Comments)    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    Medications   Medication Sig Start Date End Date Taking? Authorizing Provider  albuterol (PROVENTIL HFA;VENTOLIN HFA) 108 (90 BASE) MCG/ACT inhaler Inhale 2 puffs into the lungs every 6 (six) hours as needed for wheezing or shortness of breath. 07/30/14  Yes Triplett, Cari B, FNP  etonogestrel (NEXPLANON) 68 MG IMPL implant 68 mg.   Yes [provider]  sertraline (ZOLOFT) 25 MG tablet Take 1 tablet (25 mg total) by mouth daily. 03/17/17 03/17/18 Yes Ravi, Himabindu, MD  traZODone (DESYREL) 50 MG tablet Take 1 tablet (50 mg total) by mouth at bedtime. 02/19/17  Yes  Elvin So, MD    ROS   Physical Exam BP 132/84   Wt 298 lb (135.2 kg)   BMI 45.31 kg/m  No LMP recorded. Patient has had an implant. Physical Exam  Constitutional: She is oriented to person, place, and time. She appears well-developed and well-nourished. No distress.  Genitourinary: Vagina normal. Pelvic exam was performed with patient supine. There is no rash, tenderness or lesion on the right labia. There is no rash, tenderness or lesion on the left labia. Vagina exhibits no lesion. No tenderness or bleeding in the vagina. No signs of injury around the vagina. Right adnexum does not display mass, does not display tenderness and does not display fullness.  Left adnexum displays fullness. Left adnexum does not display mass and does not display tenderness. Cervix does not exhibit motion tenderness, lesion, discharge or polyp.   Uterus is tender (mildly ttp) and mobile. Uterus is not enlarged, exhibiting a mass or irregular (is regular).  HENT:  Head: Normocephalic and atraumatic.  Eyes: EOM are normal. No scleral icterus.  Neck: Normal range of motion. Neck supple. No thyromegaly present.  Acanthosis nigracans  Cardiovascular: Normal rate and regular rhythm. Exam reveals no gallop and no friction rub.  No murmur heard. Pulmonary/Chest: Effort normal and breath sounds normal. No respiratory distress. She has no wheezes. She has no rales.  Abdominal: Soft. Bowel sounds are normal. She exhibits no distension and no mass. There is no tenderness. There is no rebound and no guarding.  Limited by body habitus  Musculoskeletal: Normal range of motion. She exhibits no edema.  Lymphadenopathy:    She has no cervical adenopathy.  Neurological: She is alert and oriented to person, place, and time. No cranial nerve deficit.  Skin: Skin is warm and dry. No erythema.  Psychiatric: She has a normal mood and affect. Her behavior is normal. Judgment normal.   Female chaperone present for pelvic  and breast  portions of the physical exam  Assessment: 22 y.o. G53P0010 female here for  1. Pelvic and perineal pain      Plan: Problem List Items Addressed This Visit      Other   Pelvic and perineal pain - Primary   Relevant Orders   Urine Culture   Chlamydia/Gonococcus/Trichomonas, NAA   US PELVIS TRANSVANGINAL NON-OB (TV ONLY)    Will initiate gynecologic workup. However, discussed that pelvic pain can originate from multiple sources, such as GI, GU, MSK, and can have a psychiatric component.  From a gynecologic standpoint, will rule out infection and structural issues.  Discussed other common chronic pelvic pain conditions, such as endometriosis. Discussed potential workup, including; surgery versus presumptive treatment. From her history, it appears she has received some medications which would be considered treatment for endometriosis, which do not appear to have worked.  Further  workup based upon this initial workup.  Prentice Docker, MD 04/09/2017 12:15 PM     CC: Frazier Richards, [address] [place], [place] [address]

## 2017-04-11 LAB — URINE CULTURE: ORGANISM ID, BACTERIA: NO GROWTH

## 2017-04-12 LAB — CHLAMYDIA/GONOCOCCUS/TRICHOMONAS, NAA
Chlamydia by NAA: NEGATIVE
Gonococcus by NAA: NEGATIVE
TRICH VAG BY NAA: NEGATIVE

## 2017-04-21 ENCOUNTER — Telehealth: Payer: Self-pay

## 2017-04-21 NOTE — Telephone Encounter (Signed)
Received a request from the pharmacy for trazodone 50mg  . Pt was last seen on  03-17-17 next appt  05-15-17  traZODone (DESYREL) 50 MG tablet  Medication  Date: 02/19/2017 Department: [id number] Ordering/Authorizing: Elvin So, MD  Order Providers   Prescribing Provider Encounter Provider  Elvin So, MD Elvin So, MD  Outpatient Medication Detail    Disp Refills Start End   traZODone (DESYREL) 50 MG tablet 30 tablet 1 02/19/2017    Sig - Route: Take 1 tablet (50 mg total) by mouth at bedtime. - Oral   Sent to pharmacy as: traZODone (DESYREL) 50 MG tablet   E-Prescribing Status: Receipt confirmed by pharmacy (02/19/2017 2:13 PM EST)

## 2017-04-22 ENCOUNTER — Encounter: Payer: Self-pay | Admitting: Obstetrics and Gynecology

## 2017-04-22 ENCOUNTER — Ambulatory Visit: Payer: BLUE CROSS/BLUE SHIELD | Admitting: Obstetrics and Gynecology

## 2017-04-22 ENCOUNTER — Ambulatory Visit (INDEPENDENT_AMBULATORY_CARE_PROVIDER_SITE_OTHER): Payer: BLUE CROSS/BLUE SHIELD

## 2017-04-22 VITALS — BP 140/80 | Ht 68.0 in | Wt 299.0 lb

## 2017-04-22 DIAGNOSIS — N859 Noninflammatory disorder of uterus, unspecified: Secondary | ICD-10-CM

## 2017-04-22 DIAGNOSIS — R102 Pelvic and perineal pain: Secondary | ICD-10-CM

## 2017-04-22 DIAGNOSIS — N858 Other specified noninflammatory disorders of uterus: Secondary | ICD-10-CM | POA: Insufficient documentation

## 2017-04-22 NOTE — Telephone Encounter (Signed)
Please call in

## 2017-04-22 NOTE — Telephone Encounter (Signed)
  Pt will not have enough medication to do until her next appt.   traZODone (DESYREL) 50 MG tablet  Medication  Date: 02/19/2017 Department: [facility] Ordering/Authorizing: Elvin So, MD  Order Providers   Prescribing Provider Encounter Provider  Elvin So, MD Elvin So, MD  Outpatient Medication Detail    Disp Refills Start End   traZODone (DESYREL) 50 MG tablet 30 tablet 1 02/19/2017    Sig - Route: Take 1 tablet (50 mg total) by mouth at bedtime. - Oral   Sent to pharmacy as: traZODone (DESYREL) 50 MG tablet   E-Prescribing Status: Receipt confirmed by pharmacy (02/19/2017 2:13 PM EST)

## 2017-04-22 NOTE — Progress Notes (Signed)
Gynecology Ultrasound Follow Up   Chief Complaint  Patient presents with  . Follow-up    u/s, pelvic pain    History of Present Illness: Patient is a 22 y.o. female who presents today for ultrasound evaluation of the above .  Ultrasound demonstrates the following findings Adnexa: no masses seen (overall ovaries enlarged) Uterus: anteverted with endometrial stripe  18.7 mm Additional: overall, the ultrasound demonstrates heterogeneous echotexture within the myometrium, with possible fibroids, adenomyosis, fibroadenomas.  The endometrial stripe is difficult to see isolated.   Past Medical History:  Diagnosis Date  . Asthma   . Diabetes mellitus without complication [facility])     Past Surgical History:  Procedure Laterality Date  . EYE SURGERY    . TONSILLECTOMY      Family History  Problem Relation Age of Onset  . Anxiety disorder Mother   . Depression Mother   . Alcohol abuse Father   . Drug abuse Father   . Schizophrenia Maternal Uncle     Social History   Socioeconomic History  . Marital status: Single    Spouse name: Not on file  . Number of children: 0  . Years of education: Not on file  . Highest education level: Associate degree: occupational, Hotel manager, or vocational program  Occupational History  . Not on file  Social Needs  . Financial resource strain: Somewhat hard  . Food insecurity:    Worry: Never true    Inability: Never true  . Transportation needs:    Medical: No    Non-medical: No  Tobacco Use  . Smoking status: Never Smoker  . Smokeless tobacco: Never Used  Substance and Sexual Activity  . Alcohol use: No  . Drug use: No  . Sexual activity: Yes    Birth control/protection: Implant  Lifestyle  . Physical activity:    Days per week: 0 days    Minutes per session: Not on file  . Stress: Rather much  Relationships  . Social connections:    Talks on phone: More than three times a week    Gets together: More than three times a week   Attends religious service: More than 4 times per year    Active member of club or organization: No    Attends meetings of clubs or organizations: Never    Relationship status: Never married  . Intimate partner violence:    Fear of current or ex partner: No    Emotionally abused: No    Physically abused: No    Forced sexual activity: No  Other Topics Concern  . Not on file  Social History Narrative  . Not on file    Allergies  Allergen Reactions  . Tramadol Other (See Comments)    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    Prior to Admission medications   Medication Sig Start Date End Date Taking? Authorizing Provider  albuterol (PROVENTIL HFA;VENTOLIN HFA) 108 (90 BASE) MCG/ACT inhaler Inhale 2 puffs into the lungs every 6 (six) hours as needed for wheezing or shortness of breath. 07/30/14   Triplett, Johnette Abraham B, FNP  etonogestrel (NEXPLANON) 68 MG IMPL implant 68 mg.    [provider]  sertraline (ZOLOFT) 25 MG tablet Take 1 tablet (25 mg total) by mouth daily. 03/17/17 03/17/18  Elvin So, MD  traZODone (DESYREL) 50 MG tablet Take 1 tablet (50 mg total) by mouth at bedtime. 02/19/17   Elvin So, MD    Physical Exam BP 140/80   Ht 5\' 8"  (  1.727 m)   Wt 299 lb (135.6 kg)   BMI 45.46 kg/m    General: NAD HEENT: normocephalic, anicteric Pulmonary: No increased work of breathing Extremities: no edema, erythema, or tenderness Neurologic: Grossly intact, normal gait Psychiatric: mood appropriate, affect full   Assessment: 22 y.o. G1P0010  1. Pelvic and perineal pain   2. Female pelvic pain   3. Uterine mass      Plan: Problem List Items Addressed This Visit      Other   Female pelvic pain   Relevant Orders   MR PELVIS W CONTRAST   Pelvic and perineal pain - Primary   Relevant Orders   MR PELVIS W CONTRAST   Uterine mass   Relevant Orders   MR PELVIS W CONTRAST     Discussed the abnormal findings of this ultrasound.  Noted that I could not give her a  definitive diagnosis based on the features of the ultrasound and, therefore, I could not discuss a specific management for her pelvic pain issues.  We discussed that given her age and risk factors, the findings most likely represent a benign cause. However, I can never definitively rule out neoplasia, especially in an ultrasound that is as technically difficult to read as hers. I recommended an MRI to her to better characterize the findings, the differential of which includes, fibroids, adenomyosis, fibroadenoma, neoplasia.  She voiced understanding and agreement with the plan. 15 minutes spent in face to face discussion with > 50% spent in counseling,management, and coordination of care of her pelvic pain, uterine mass.   Prentice Docker, MD, [facility], [facility] 04/22/2017 3:00 PM    CC:  Frazier Richards, MD [address] [place], [place] [address]

## 2017-04-22 NOTE — Telephone Encounter (Signed)
Please call it in 

## 2017-04-23 NOTE — Addendum Note (Signed)
Addended by: Prentice Docker D on: 04/23/2017 11:46 AM   Modules accepted: Orders

## 2017-04-23 NOTE — Addendum Note (Signed)
Addended by: Prentice Docker D on: 04/23/2017 08:43 AM   Modules accepted: Orders

## 2017-04-24 ENCOUNTER — Other Ambulatory Visit: Payer: Self-pay | Admitting: Psychiatry

## 2017-04-24 MED ORDER — TRAZODONE HCL 50 MG PO TABS: 50.0000 mg | ORAL_TABLET | Freq: Every day | ORAL | 1 refills | Status: DC

## 2017-04-24 NOTE — Telephone Encounter (Signed)
Printed the prescription

## 2017-04-24 NOTE — Telephone Encounter (Signed)
received another notice requesting a refill on trazodone pt will not have enough until next appt.  traZODone (DESYREL) 50 MG tablet  Medication  Date: 02/19/2017 Department: [facility] Ordering/Authorizing: Elvin So, MD  Order Providers   Prescribing Provider Encounter Provider  Elvin So, MD Elvin So, MD  Outpatient Medication Detail    Disp Refills Start End   traZODone (DESYREL) 50 MG tablet 30 tablet 1 02/19/2017    Sig - Route: Take 1 tablet (50 mg total) by mouth at bedtime. - Oral   Sent to pharmacy as: traZODone (DESYREL) 50 MG tablet   E-Prescribing Status: Receipt confirmed by pharmacy (02/19/2017 2:13 PM EST)

## 2017-04-24 NOTE — Telephone Encounter (Signed)
faxed and confirmed rx for trazodone 50mg  id# [id number] order # [id number]

## 2017-05-07 ENCOUNTER — Ambulatory Visit
Admission: RE | Admit: 2017-05-07 | Discharge: 2017-05-07 | Disposition: A | Discharge status: Home / Self Care | Payer: BLUE CROSS/BLUE SHIELD | Source: Ambulatory Visit | Attending: Obstetrics and Gynecology | Admitting: Obstetrics and Gynecology

## 2017-05-07 DIAGNOSIS — D251 Intramural leiomyoma of uterus: Secondary | ICD-10-CM | POA: Diagnosis not present

## 2017-05-07 DIAGNOSIS — R102 Pelvic and perineal pain: Secondary | ICD-10-CM | POA: Diagnosis present

## 2017-05-07 DIAGNOSIS — N858 Other specified noninflammatory disorders of uterus: Secondary | ICD-10-CM

## 2017-05-07 DIAGNOSIS — N859 Noninflammatory disorder of uterus, unspecified: Secondary | ICD-10-CM | POA: Diagnosis present

## 2017-05-07 MED ORDER — GADOBENATE DIMEGLUMINE 529 MG/ML IV SOLN
20.0000 mL | Freq: Once | INTRAVENOUS | Status: AC | PRN
  Administered 2017-05-07: 20 mL via INTRAVENOUS

## 2017-05-09 ENCOUNTER — Telehealth: Payer: Self-pay

## 2017-05-09 NOTE — Telephone Encounter (Signed)
Please call patient and let her know that I received the results yesterday, but unfortunately did not have a chance to call her and review them.   Please let her know that I have reviewed the results and plan on calling her Monday, as I am out of the office today.  Let her know that I did not see any dangerous findings.  But, do want to review the results with her so she understands them.  Please send this back to me after you've spoken with her so I'll have a reminder to call her Monday.

## 2017-05-09 NOTE — Telephone Encounter (Signed)
Pt called triage stating the imaging center told her to call [facility] if she had not heard from [place] by 05/08/17. Pt would like to phone results of recent scans done. Cb#[phone]

## 2017-05-12 NOTE — Telephone Encounter (Signed)
Advised pt SDJ would try to call on lunch depending on how AM went. If not it would be sometime today. fwding back to him for call back.

## 2017-05-12 NOTE — Telephone Encounter (Signed)
Pt rcvd a call this a.m. Stating she would get a lunch by lunch time. She is on her lunch break now & hasn't heard back. Inquiring if we had forgot to call her. Cb#[phone]

## 2017-05-12 NOTE — Telephone Encounter (Signed)
Discussed findings of "innumerable" fibroids in uterus. Discussed options of treatment, including; 1) do nothing and see how things go, attempting pregnancy when ready 2) removal of as many fibroids as possible surgically, potentially at [facility] vs a tertiary care center 3) second opinion with an REI on treatment options  (all the above with the idea of preserving fertility) 4) hysterectomy, understanding that fertility is given up.  She would like to consider her options. She states that she has been suffering for a while and that a hysterectomy is not a bad option for her. I encouraged her to consider these options for a while and get back to me with further questions or with a direction she would like to take.  Prentice Docker, MD, [facility], [facility] 05/12/2017 6:24 PM

## 2017-05-12 NOTE — Telephone Encounter (Signed)
Pt aware that SDJ will call her today with results. I was sent home sick Friday so did not see this until this AM.

## 2017-05-15 ENCOUNTER — Other Ambulatory Visit: Payer: Self-pay

## 2017-05-15 ENCOUNTER — Encounter: Payer: Self-pay | Admitting: Psychiatry

## 2017-05-15 ENCOUNTER — Ambulatory Visit (INDEPENDENT_AMBULATORY_CARE_PROVIDER_SITE_OTHER): Payer: BLUE CROSS/BLUE SHIELD | Admitting: Psychiatry

## 2017-05-15 VITALS — BP 127/79 | HR 81 | Temp 99.0°F | Wt 297.4 lb

## 2017-05-15 DIAGNOSIS — F331 Major depressive disorder, recurrent, moderate: Secondary | ICD-10-CM

## 2017-05-15 MED ORDER — TRAZODONE HCL 50 MG PO TABS
50.0000 mg | ORAL_TABLET | Freq: Every day | ORAL | 1 refills | Status: DC
Start: 2017-05-15 — End: 2017-07-15

## 2017-05-15 MED ORDER — SERTRALINE HCL 25 MG PO TABS: 25.0000 mg | ORAL_TABLET | Freq: Every day | ORAL | 1 refills | Status: DC

## 2017-05-15 NOTE — Progress Notes (Signed)
Psychiatric progress note  Patient Identification: Shari [name] MRN:  [id number] Date of Evaluation:  05/15/2017 Referral Source: Uvaldo Bristle- flowers LCSWA Chief Complaint:   Chief Complaint    Follow-up; Medication Refill    doing well Visit Diagnosis:    ICD-10-CM   1. MDD (major depressive disorder), recurrent episode, moderate (HCC) F33.1     History of Present Illness:  Patient is a 22 year old African-American female who presents today for follow up of MDD. Patient reports that she continues to do well. Her dizziness has improved significantly. She is busy with her classes and her job. Denies any problems today. Denies any suicidal thoughts.   Past Psychiatric History: was seeing a psychiatrist at [facility]  Previous Psychotropic Medications: Yes   Substance Abuse History in the last 12 months:  No.  Consequences of Substance Abuse: Negative  Past Medical History:  Past Medical History:  Diagnosis Date  . Asthma   . Diabetes mellitus without complication [facility])     Past Surgical History:  Procedure Laterality Date  . EYE SURGERY    . TONSILLECTOMY      Family Psychiatric History: Mother has anxiety, other family members have anxiety.  Family History:  Family History  Problem Relation Age of Onset  . Anxiety disorder Mother   . Depression Mother   . Alcohol abuse Father   . Drug abuse Father   . Schizophrenia Maternal Uncle     Social History:   Social History   Socioeconomic History  . Marital status: Single    Spouse name: Not on file  . Number of children: 0  . Years of education: Not on file  . Highest education level: Associate degree: occupational, Hotel manager, or vocational program  Occupational History  . Not on file  Social Needs  . Financial resource strain: Somewhat hard  . Food insecurity:    Worry: Never true    Inability: Never true  . Transportation needs:    Medical: No    Non-medical: No  Tobacco Use  . Smoking status: Never  Smoker  . Smokeless tobacco: Never Used  Substance and Sexual Activity  . Alcohol use: No  . Drug use: No  . Sexual activity: Yes    Birth control/protection: Implant  Lifestyle  . Physical activity:    Days per week: 0 days    Minutes per session: Not on file  . Stress: Rather much  Relationships  . Social connections:    Talks on phone: More than three times a week    Gets together: More than three times a week    Attends religious service: More than 4 times per year    Active member of club or organization: No    Attends meetings of clubs or organizations: Never    Relationship status: Never married  Other Topics Concern  . Not on file  Social History Narrative  . Not on file    Additional Social History: Lives with her mother and grandparents  Allergies:   Allergies  Allergen Reactions  . Tramadol Other (See Comments)    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    Metabolic Disorder Labs: Lab Results  Component Value Date   HGBA1C 6.0 (H) 12/07/2015   MPG 126 12/07/2015   Lab Results  Component Value Date   PROLACTIN 14.1 12/07/2015   Lab Results  Component Value Date   CHOL 321 (H) 12/07/2015   TRIG 71 12/07/2015   HDL 41 12/07/2015   CHOLHDL 7.8 12/07/2015  VLDL 14 12/07/2015   LDLCALC 266 (H) 12/07/2015     Current Medications: Current Outpatient Medications  Medication Sig Dispense Refill  . acetaminophen (TYLENOL) 325 MG tablet Take by mouth.    . etonogestrel (NEXPLANON) 68 MG IMPL implant 68 mg.    . sertraline (ZOLOFT) 25 MG tablet Take 1 tablet (25 mg total) by mouth daily. 30 tablet 1  . traZODone (DESYREL) 50 MG tablet Take 1 tablet (50 mg total) by mouth at bedtime. 30 tablet 1   No current facility-administered medications for this visit.     Neurologic: Headache: No Seizure: No Paresthesias:No  Musculoskeletal: Strength & Muscle Tone: within normal limits Gait & Station: normal Patient leans: N/A  Psychiatric Specialty  Exam: ROS  Blood pressure 127/79, pulse 81, temperature 99 F (37.2 C), temperature source Oral, weight 134.9 kg (297 lb 6.4 oz).Body mass index is 45.22 kg/m.  General Appearance: Casual  Eye Contact:  Good  Speech:  Clear and Coherent  Volume:  Normal  Mood:  improved  Affect:  pleasant  Thought Process:  Coherent  Orientation:  Full (Time, Place, and Person)  Thought Content:  Logical  Suicidal Thoughts:  No  Homicidal Thoughts:  No  Memory:  Immediate;   Fair Recent;   Fair Remote;   Fair  Judgement:  Fair  Insight:  Fair  Psychomotor Activity:  Normal  Concentration:  Concentration: Fair and Attention Span: Fair  Recall:  [employer] of Knowledge:Fair  Language: Fair  Akathisia:  No  Handed:  Right  AIMS (if indicated):  na  Assets:  Communication Skills Desire for Improvement Financial Resources/Insurance Housing Physical Health Resilience Social Support Vocational/Educational  ADL's:  Intact  Cognition: WNL  Sleep: better    Treatment Plan Summary: Major depressive disorder moderate  Continue Zoloft at 25 mg once daily.   Insomnia Continue trazodone at 50mg  po qhs.  Return to clinic in 2 months time or call before if needed.   Elvin So, MD 4/18/20192:07 PM

## 2017-05-29 ENCOUNTER — Telehealth: Payer: Self-pay

## 2017-05-29 NOTE — Telephone Encounter (Signed)
Pt called triage line stating she would like to go ahead and get the referral to see a fertility specialist. She has been talking to [facility] and he told her to call when she was ready. CB# [phone]

## 2017-06-10 ENCOUNTER — Other Ambulatory Visit: Payer: Self-pay | Admitting: Obstetrics and Gynecology

## 2017-06-10 DIAGNOSIS — D259 Leiomyoma of uterus, unspecified: Secondary | ICD-10-CM | POA: Insufficient documentation

## 2017-06-10 DIAGNOSIS — D251 Intramural leiomyoma of uterus: Secondary | ICD-10-CM

## 2017-06-10 NOTE — Telephone Encounter (Signed)
Would you mind calling patient and asking her whether she prefers to be sent to [facility] or to a clinic in [place]?  I will make the referral once she has let me know. Thanks!

## 2017-06-10 NOTE — Telephone Encounter (Signed)
Referral has been sent to [facility] in [place].

## 2017-06-10 NOTE — Telephone Encounter (Signed)
Pt states that either is fine, she does not have a preference. fwding back to SDJ to make referral. Pt aware that someone will be calling from the office he refers her to.

## 2017-06-10 NOTE — Telephone Encounter (Signed)
Patient is calling to follow up with Dr. Glennon Mac about referral. Patient aware Dr. Glennon Mac isn't in office today and message has been sent

## 2017-06-15 ENCOUNTER — Other Ambulatory Visit: Payer: Self-pay

## 2017-06-15 DIAGNOSIS — J45909 Unspecified asthma, uncomplicated: Secondary | ICD-10-CM | POA: Diagnosis not present

## 2017-06-15 DIAGNOSIS — E119 Type 2 diabetes mellitus without complications: Secondary | ICD-10-CM | POA: Diagnosis not present

## 2017-06-15 DIAGNOSIS — R109 Unspecified abdominal pain: Secondary | ICD-10-CM | POA: Insufficient documentation

## 2017-06-15 DIAGNOSIS — Z87891 Personal history of nicotine dependence: Secondary | ICD-10-CM | POA: Diagnosis not present

## 2017-06-15 DIAGNOSIS — Z79899 Other long term (current) drug therapy: Secondary | ICD-10-CM | POA: Diagnosis not present

## 2017-06-15 DIAGNOSIS — R1032 Left lower quadrant pain: Secondary | ICD-10-CM | POA: Diagnosis not present

## 2017-06-15 LAB — URINALYSIS, COMPLETE (UACMP) WITH MICROSCOPIC
Bilirubin Urine: NEGATIVE
GLUCOSE, UA: NEGATIVE mg/dL
HGB URINE DIPSTICK: NEGATIVE
KETONES UR: NEGATIVE mg/dL
Leukocytes, UA: NEGATIVE
NITRITE: NEGATIVE
PH: 5 (ref 5.0–8.0)
Protein, ur: 30 mg/dL — AB
Specific Gravity, Urine: 1.033 — ABNORMAL HIGH (ref 1.005–1.030)

## 2017-06-15 NOTE — ED Triage Notes (Signed)
Patient presents with left flank pain x 3 days. States history of fibroids but this is different. Pain described as sharp and stabbing, intermittent. Denies dysuria, urgency or frequency. Low back pain on the left side.

## 2017-06-16 ENCOUNTER — Emergency Department
Admission: EM | Admit: 2017-06-16 | Discharge: 2017-06-16 | Disposition: A | Discharge status: Home / Self Care | Payer: BLUE CROSS/BLUE SHIELD | Attending: Emergency Medicine | Admitting: Emergency Medicine

## 2017-06-16 ENCOUNTER — Emergency Department: Payer: BLUE CROSS/BLUE SHIELD

## 2017-06-16 DIAGNOSIS — R1032 Left lower quadrant pain: Secondary | ICD-10-CM

## 2017-06-16 DIAGNOSIS — R109 Unspecified abdominal pain: Secondary | ICD-10-CM

## 2017-06-16 LAB — CBC WITH DIFFERENTIAL/PLATELET
BASOS PCT: 1 %
Basophils Absolute: 0.1 10*3/uL (ref 0–0.1)
Eosinophils Absolute: 0.6 10*3/uL (ref 0–0.7)
Eosinophils Relative: 5 %
HEMATOCRIT: 32.4 % — AB (ref 35.0–47.0)
HEMOGLOBIN: 10.6 g/dL — AB (ref 12.0–16.0)
LYMPHS ABS: 3.3 10*3/uL (ref 1.0–3.6)
Lymphocytes Relative: 29 %
MCH: 25.6 pg — AB (ref 26.0–34.0)
MCHC: 32.8 g/dL (ref 32.0–36.0)
MCV: 78.1 fL — ABNORMAL LOW (ref 80.0–100.0)
MONOS PCT: 6 %
Monocytes Absolute: 0.7 10*3/uL (ref 0.2–0.9)
NEUTROS PCT: 59 %
Neutro Abs: 6.7 10*3/uL — ABNORMAL HIGH (ref 1.4–6.5)
Platelets: 418 10*3/uL (ref 150–440)
RBC: 4.14 MIL/uL (ref 3.80–5.20)
RDW: 17 % — ABNORMAL HIGH (ref 11.5–14.5)
WBC: 11.5 10*3/uL — ABNORMAL HIGH (ref 3.6–11.0)

## 2017-06-16 LAB — BASIC METABOLIC PANEL
Anion gap: 7 (ref 5–15)
BUN: 16 mg/dL (ref 6–20)
CHLORIDE: 104 mmol/L (ref 101–111)
CO2: 25 mmol/L (ref 22–32)
CREATININE: 0.74 mg/dL (ref 0.44–1.00)
Calcium: 8.7 mg/dL — ABNORMAL LOW (ref 8.9–10.3)
GFR calc non Af Amer: >60 mL/min (ref >60)
Glucose, Bld: 105 mg/dL — ABNORMAL HIGH (ref 65–99)
POTASSIUM: 3.6 mmol/L (ref 3.5–5.1)
Sodium: 136 mmol/L (ref 135–145)

## 2017-06-16 LAB — PREGNANCY, URINE: Preg Test, Ur: NEGATIVE

## 2017-06-16 LAB — POCT PREGNANCY, URINE
PREG TEST UR: NEGATIVE
Preg Test, Ur: NEGATIVE

## 2017-06-16 MED ORDER — IBUPROFEN 800 MG PO TABS: 800.0000 mg | ORAL_TABLET | Freq: Three times a day (TID) | ORAL | 0 refills | Status: DC | PRN

## 2017-06-16 MED ORDER — CYCLOBENZAPRINE HCL 5 MG PO TABS: 5.0000 mg | ORAL_TABLET | Freq: Three times a day (TID) | ORAL | 0 refills | Status: DC | PRN

## 2017-06-16 MED ORDER — KETOROLAC TROMETHAMINE 30 MG/ML IJ SOLN
10.0000 mg | Freq: Once | INTRAMUSCULAR | Status: AC
  Administered 2017-06-16: 9.9 mg via INTRAVENOUS
  Filled 2017-06-16: qty 1

## 2017-06-16 MED ORDER — CYCLOBENZAPRINE HCL 10 MG PO TABS
5.0000 mg | ORAL_TABLET | Freq: Once | ORAL | Status: AC
  Administered 2017-06-16: 5 mg via ORAL
  Filled 2017-06-16: qty 1

## 2017-06-16 NOTE — Discharge Instructions (Addendum)
Take medicines as needed for pain & muscle pain (Motrin/Flexeril #15). Return to the ER for worsening symptoms, persistent vomiting, difficulty breathing or other concerns.

## 2017-06-16 NOTE — ED Notes (Signed)
Pt to ct 

## 2017-06-16 NOTE — ED Provider Notes (Signed)
[facility] Emergency Department Provider Note   ____________________________________________   First MD Initiated Contact with Patient 06/16/17 [phone]     (approximate)  I have reviewed the triage vital signs and the nursing notes.   HISTORY  Chief Complaint Flank Pain (Left)    HPI Shari [name] is a 22 y.o. female who presents to the ED from home with a chief complaint of left flank pain.  Patient reports a 3-day history of nontraumatic pain to her left flank which radiates into her left lower quadrant.  States she has a history of fibroids but this pain feels different.  Reports sharp, stabbing type pain which is intermittent and unaffected by positioning or movement.  Denies associated fever, chills, chest pain, shortness of breath, nausea, vomiting, dysuria, hematuria, vaginal discharge.  Denies recent travel or trauma.   Past Medical History:  Diagnosis Date  . Asthma   . Diabetes mellitus without complication [facility])     Patient Active Problem List   Diagnosis Date Noted  . Fibroid uterus 06/10/2017  . Uterine mass 04/22/2017  . Pneumonia 01/28/2017  . Asthma 12/07/2015  . Diabetes mellitus without complication ([place]) 89/38/1017  . Obesity 12/06/2015  . Chronic pain 12/06/2015  . Schizophrenia spectrum disorder with psychotic disorder type not yet determined ([place]) 12/06/2015  . Pain in both feet 05/02/2015  . 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 Female pelvic pain 06/04/2012  . Pelvic and perineal pain 06/04/2012  . Incomplete emptying of bladder 12/13/2011  . Retention of urine 12/13/2011  . Symptoms involving urinary system 12/13/2011  . Urge incontinence 12/13/2011  . Increased frequency of urination 12/13/2011  . 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 Regular astigmatism 05/07/2010    Past Surgical History:  Procedure Laterality Date  . EYE SURGERY    . TONSILLECTOMY      Prior to Admission medications   Medication Sig Start Date End Date  Taking? Authorizing Provider  acetaminophen (TYLENOL) 325 MG tablet Take by mouth. 01/31/17   [provider]  cyclobenzaprine (FLEXERIL) 5 MG tablet Take 1 tablet (5 mg total) by mouth 3 (three) times daily as needed for muscle spasms. 06/16/17   Paulette Blanch, MD  etonogestrel (NEXPLANON) 68 MG IMPL implant 68 mg.    [provider]  ibuprofen (ADVIL,MOTRIN) 800 MG tablet Take 1 tablet (800 mg total) by mouth every 8 (eight) hours as needed for moderate pain. 06/16/17   Paulette Blanch, MD  sertraline (ZOLOFT) 25 MG tablet Take 1 tablet (25 mg total) by mouth daily. 05/15/17 05/15/18  Elvin So, MD  traZODone (DESYREL) 50 MG tablet Take 1 tablet (50 mg total) by mouth at bedtime. 05/15/17   Elvin So, MD    Allergies Tramadol  Family History  Problem Relation Age of Onset  . Anxiety disorder Mother   . Depression Mother   . Alcohol abuse Father   . Drug abuse Father   . Schizophrenia Maternal Uncle     Social History Social History   Tobacco Use  . Smoking status: Former Research scientist (life sciences)  . Smokeless tobacco: Never Used  Substance Use Topics  . Alcohol use: No  . Drug use: No    Review of Systems  Constitutional: No fever/chills. Eyes: No visual changes. ENT: No sore throat. Cardiovascular: Denies chest pain. Respiratory: Denies shortness of breath. Gastrointestinal: Positive for left flank to left lower quadrant abdominal pain.  No nausea, no vomiting.  No diarrhea.  No constipation. Genitourinary: Negative for dysuria. Musculoskeletal: Negative for  back pain. Skin: Negative for rash. Neurological: Negative for headaches, focal weakness or numbness.   ____________________________________________   PHYSICAL EXAM:  VITAL SIGNS: ED Triage Vitals  Enc Vitals Group     BP 06/15/17 2322 (!) 158/78     Pulse Rate 06/15/17 2322 86     Resp 06/15/17 2322 18     Temp 06/15/17 2322 98.6 F (37 C)     Temp Source 06/15/17 2322 Oral     SpO2 06/15/17 2322 97  %     Weight 06/15/17 2323 293 lb (132.9 kg)     Height 06/15/17 2323 5\' 8"  (1.727 m)     Head Circumference --      Peak Flow --      Pain Score 06/15/17 2323 10     Pain Loc --      Pain Edu? --      Excl. in [place]? --     Constitutional: Alert and oriented. Well appearing and in no acute distress. Eyes: Conjunctivae are normal. PERRL. EOMI. Head: Atraumatic. Nose: No congestion/rhinnorhea. Mouth/Throat: Mucous membranes are moist.  Oropharynx non-erythematous. Neck: No stridor.   Cardiovascular: Normal rate, regular rhythm. Grossly normal heart sounds.  Good peripheral circulation. Respiratory: Normal respiratory effort.  No retractions. Lungs CTAB. Gastrointestinal: Obese.  Soft and mildly tender to palpation left flank and left lower quadrant without rebound or guarding. No distention. No abdominal bruits. Musculoskeletal: No lower extremity tenderness nor edema.  No joint effusions. Neurologic:  Normal speech and language. No gross focal neurologic deficits are appreciated. No gait instability. Skin:  Skin is warm, dry and intact. No rash noted. Psychiatric: Mood and affect are normal. Speech and behavior are normal.  ____________________________________________   LABS (all labs ordered are listed, but only abnormal results are displayed)  Labs Reviewed  URINALYSIS, COMPLETE (UACMP) WITH MICROSCOPIC - Abnormal; Notable for the following components:      Result Value   Color, Urine YELLOW (*)    APPearance HAZY (*)    Specific Gravity, Urine 1.033 (*)    Protein, ur 30 (*)    Bacteria, UA FEW (*)    All other components within normal limits  CBC WITH DIFFERENTIAL/PLATELET - Abnormal; Notable for the following components:   WBC 11.5 (*)    Hemoglobin 10.6 (*)    HCT 32.4 (*)    MCV 78.1 (*)    MCH 25.6 (*)    RDW 17.0 (*)    Neutro Abs 6.7 (*)    All other components within normal limits  BASIC METABOLIC PANEL - Abnormal; Notable for the following components:    Glucose, Bld 105 (*)    Calcium 8.7 (*)    All other components within normal limits  PREGNANCY, URINE  POCT PREGNANCY, URINE   ____________________________________________  EKG  None ____________________________________________  RADIOLOGY  ED MD interpretation: Unremarkable CT except for fibroid uterus  Official radiology report(s): Ct Renal Stone Study  Result Date: 06/16/2017 CLINICAL DATA:  Initial evaluation for acute left flank pain. EXAM: CT ABDOMEN AND PELVIS WITHOUT CONTRAST TECHNIQUE: Multidetector CT imaging of the abdomen and pelvis was performed following the standard protocol without IV contrast. COMPARISON:  Prior MRI from 05/07/2017. FINDINGS: Lower chest: Visualized lung bases are clear. Hepatobiliary: Liver demonstrates a normal unenhanced appearance. Gallbladder contracted without acute abnormality. No biliary dilatation. Pancreas: Pancreas within normal limits. Spleen: Unremarkable. Adrenals/Urinary Tract: Adrenal glands are normal. Kidneys equal in size without evidence for nephrolithiasis or hydronephrosis. No radiopaque calculi seen along  the course of either renal collecting system. No hydroureter. Bladder largely decompressed without acute abnormality. No layering stones within the bladder lumen. Stomach/Bowel: Stomach within normal limits. No evidence for bowel obstruction. Normal appendix. No acute inflammatory changes seen about the bowels. Vascular/Lymphatic: Intra-abdominal aorta of normal caliber. No adenopathy. Reproductive: Enlarged uterus with innumerable fibroids, not well delineated on this noncontrast examination. No appreciable adnexal mass. Other: Small volume free fluid within the pelvic cul-de-sac, likely physiologic. No free intraperitoneal air. Musculoskeletal: No acute osseus abnormality. No worrisome lytic or blastic osseous lesions. IMPRESSION: 1. No CT evidence for nephrolithiasis or obstructive uropathy. 2. Small volume free fluid within the pelvis,  presumably physiologic. 3. Enlarged fibroid uterus. 4. No other acute abnormality within the abdomen and pelvis. Electronically Signed   By: Jeannine Boga M.D.   On: 06/16/2017 03:07    ____________________________________________   PROCEDURES  Procedure(s) performed: None  Procedures  Critical Care performed: No  ____________________________________________   INITIAL IMPRESSION / ASSESSMENT AND PLAN / ED COURSE  As part of my medical decision making, I reviewed the following data within the [place] notes reviewed and incorporated, Labs reviewed, Old chart reviewed, Radiograph reviewed  and Notes from prior ED visits   22 year old female who presents with left flank to left lower quadrant abdominal pain. Differential diagnosis includes, but is not limited to, ovarian cyst, ovarian torsion, acute appendicitis, diverticulitis, urinary tract infection/pyelonephritis, endometriosis, bowel obstruction, colitis, renal colic, gastroenteritis, hernia, fibroids, endometriosis, pregnancy related pain including ectopic pregnancy, sciatica, back strain, musculoskeletal etc.   Urinalysis unremarkable.  Will check basic lab work, pregnancy test.  Administer IV Toradol for pain.  Obtain CT renal colic study.   Clinical Course as of Jun 17 315  Mon Jun 16, 2017  1700 Pain improved.  Patient resting in no acute distress.  Updated her of laboratory and CT imaging results.  Possible musculoskeletal component to patient's pain.  Will treat with NSAIDs, muscle relaxer and patient will follow-up closely with her PCP.  Strict return precautions given.  Patient verbalizes understanding and agree with plan of care.   [JS]    Clinical Course User Index [JS] Paulette Blanch, MD     ____________________________________________   FINAL CLINICAL IMPRESSION(S) / ED DIAGNOSES  Final diagnoses:  Left flank pain  Left lower quadrant pain     ED Discharge Orders         Ordered    ibuprofen (ADVIL,MOTRIN) 800 MG tablet  Every 8 hours PRN     06/16/17 0314    cyclobenzaprine (FLEXERIL) 5 MG tablet  3 times daily PRN     06/16/17 0314       Note:  This document was prepared using Dragon voice recognition software and may include unintentional dictation errors.    Paulette Blanch, MD 06/16/17 [phone]

## 2017-06-16 NOTE — ED Notes (Signed)
IV started, blood to lab, meds given.

## 2017-06-16 NOTE — ED Notes (Signed)
Pt to CT

## 2017-07-15 ENCOUNTER — Encounter: Payer: Self-pay | Admitting: Psychiatry

## 2017-07-15 ENCOUNTER — Ambulatory Visit: Payer: BLUE CROSS/BLUE SHIELD | Admitting: Psychiatry

## 2017-07-15 VITALS — BP 112/71 | HR 82 | Ht 68.0 in | Wt 304.6 lb

## 2017-07-15 DIAGNOSIS — F331 Major depressive disorder, recurrent, moderate: Secondary | ICD-10-CM

## 2017-07-15 MED ORDER — SERTRALINE HCL 25 MG PO TABS: 25.0000 mg | ORAL_TABLET | Freq: Every day | ORAL | 1 refills | Status: DC

## 2017-07-15 MED ORDER — TRAZODONE HCL 50 MG PO TABS: 50.0000 mg | ORAL_TABLET | Freq: Every day | ORAL | 1 refills | Status: DC

## 2017-07-15 NOTE — Progress Notes (Signed)
Psychiatric progress note  Patient Identification: Shari [name] MRN:  [id number] Date of Evaluation:  07/15/2017 Referral Source: Uvaldo Bristle- flowers LCSWA Chief Complaint:   doing well Visit Diagnosis:    ICD-10-CM   1. MDD (major depressive disorder), recurrent episode, moderate (HCC) F33.1     History of Present Illness:  Patient is a 22 year old African-American female who presents today for follow up of MDD. Patient reports that she continues to do well. States she is not always consistent with taking her medications. Working with her therapist on being more compliant. She is taking one online class for the summer. Denies any suicidal thoughts. We discussed her increasing weight and strategies to address that.  Past Psychiatric History: was seeing a psychiatrist at [facility]  Previous Psychotropic Medications: Yes   Substance Abuse History in the last 12 months:  No.  Consequences of Substance Abuse: Negative  Past Medical History:  Past Medical History:  Diagnosis Date  . Asthma   . Diabetes mellitus without complication [facility])     Past Surgical History:  Procedure Laterality Date  . EYE SURGERY    . TONSILLECTOMY      Family Psychiatric History: Mother has anxiety, other family members have anxiety.  Family History:  Family History  Problem Relation Age of Onset  . Anxiety disorder Mother   . Depression Mother   . Alcohol abuse Father   . Drug abuse Father   . Schizophrenia Maternal Uncle     Social History:   Social History   Socioeconomic History  . Marital status: Single    Spouse name: Not on file  . Number of children: 0  . Years of education: Not on file  . Highest education level: Associate degree: occupational, Hotel manager, or vocational program  Occupational History  . Not on file  Social Needs  . Financial resource strain: Somewhat hard  . Food insecurity:    Worry: Never true    Inability: Never true  . Transportation needs:    Medical:  No    Non-medical: No  Tobacco Use  . Smoking status: Former Research scientist (life sciences)  . Smokeless tobacco: Never Used  Substance and Sexual Activity  . Alcohol use: No  . Drug use: No  . Sexual activity: Yes    Birth control/protection: Implant  Lifestyle  . Physical activity:    Days per week: 0 days    Minutes per session: Not on file  . Stress: Rather much  Relationships  . Social connections:    Talks on phone: More than three times a week    Gets together: More than three times a week    Attends religious service: More than 4 times per year    Active member of club or organization: No    Attends meetings of clubs or organizations: Never    Relationship status: Never married  Other Topics Concern  . Not on file  Social History Narrative  . Not on file    Additional Social History: Lives with her mother and grandparents  Allergies:   Allergies  Allergen Reactions  . Tramadol Other (See Comments)    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    Metabolic Disorder Labs: Lab Results  Component Value Date   HGBA1C 6.0 (H) 12/07/2015   MPG 126 12/07/2015   Lab Results  Component Value Date   PROLACTIN 14.1 12/07/2015   Lab Results  Component Value Date   CHOL 321 (H) 12/07/2015   TRIG 71 12/07/2015   HDL 41  12/07/2015   CHOLHDL 7.8 12/07/2015   VLDL 14 12/07/2015   LDLCALC 266 (H) 12/07/2015     Current Medications: Current Outpatient Medications  Medication Sig Dispense Refill  . acetaminophen (TYLENOL) 325 MG tablet Take by mouth.    . etonogestrel (NEXPLANON) 68 MG IMPL implant 68 mg.    . ibuprofen (ADVIL,MOTRIN) 800 MG tablet Take 1 tablet (800 mg total) by mouth every 8 (eight) hours as needed for moderate pain. 15 tablet 0  . sertraline (ZOLOFT) 25 MG tablet Take 1 tablet (25 mg total) by mouth daily. 30 tablet 1  . traZODone (DESYREL) 50 MG tablet Take 1 tablet (50 mg total) by mouth at bedtime. 30 tablet 1  . cyclobenzaprine (FLEXERIL) 5 MG tablet Take 1 tablet (5 mg  total) by mouth 3 (three) times daily as needed for muscle spasms. (Patient not taking: Reported on 07/15/2017) 15 tablet 0   No current facility-administered medications for this visit.     Neurologic: Headache: No Seizure: No Paresthesias:No  Musculoskeletal: Strength & Muscle Tone: within normal limits Gait & Station: normal Patient leans: N/A  Psychiatric Specialty Exam: ROS  Blood pressure 112/71, pulse 82, height 5\' 8"  (1.727 m), weight (!) 138.2 kg (304 lb 9.6 oz), SpO2 98 %.Body mass index is 46.31 kg/m.  General Appearance: Casual  Eye Contact:  Good  Speech:  Clear and Coherent  Volume:  Normal  Mood:  improved  Affect:  pleasant  Thought Process:  Coherent  Orientation:  Full (Time, Place, and Person)  Thought Content:  Logical  Suicidal Thoughts:  No  Homicidal Thoughts:  No  Memory:  Immediate;   Fair Recent;   Fair Remote;   Fair  Judgement:  Fair  Insight:  Fair  Psychomotor Activity:  Normal  Concentration:  Concentration: Fair and Attention Span: Fair  Recall:  [employer] of Knowledge:Fair  Language: Fair  Akathisia:  No  Handed:  Right  AIMS (if indicated):  na  Assets:  Communication Skills Desire for Improvement Financial Resources/Insurance Housing Physical Health Resilience Social Support Vocational/Educational  ADL's:  Intact  Cognition: WNL  Sleep: better    Treatment Plan Summary: Major depressive disorder moderate  Continue Zoloft at 25 mg once daily.   Obesity- Patient to f/u with her PCP and address her weight. Recommend working with a dietician or nutritionist.  Insomnia Continue trazodone at 50mg  po qhs.  Return to clinic in 2 months time or call before if needed.   Elvin So, MD 6/18/20191:04 PM

## 2017-08-19 ENCOUNTER — Ambulatory Visit: Payer: BLUE CROSS/BLUE SHIELD | Admitting: Psychiatry

## 2017-08-19 ENCOUNTER — Other Ambulatory Visit: Payer: Self-pay

## 2017-08-19 ENCOUNTER — Encounter: Payer: Self-pay | Admitting: Psychiatry

## 2017-08-19 VITALS — BP 142/78 | HR 89 | Temp 98.4°F | Wt 306.4 lb

## 2017-08-19 DIAGNOSIS — F331 Major depressive disorder, recurrent, moderate: Secondary | ICD-10-CM | POA: Diagnosis not present

## 2017-08-19 MED ORDER — SERTRALINE HCL 25 MG PO TABS: 25.0000 mg | ORAL_TABLET | Freq: Every day | ORAL | 2 refills | Status: DC

## 2017-08-19 MED ORDER — TRAZODONE HCL 50 MG PO TABS: 50.0000 mg | ORAL_TABLET | Freq: Every day | ORAL | 2 refills | Status: DC

## 2017-08-19 NOTE — Progress Notes (Signed)
Psychiatric progress note  Patient Identification: Shari [name] MRN:  [id number] Date of Evaluation:  08/19/2017 Referral Source: Uvaldo Bristle- flowers LCSWA Chief Complaint:   Chief Complaint    Follow-up; Medication Refill    doing well Visit Diagnosis:    ICD-10-CM   1. MDD (major depressive disorder), recurrent episode, moderate (HCC) F33.1     History of Present Illness:  Patient is a 22 year old African-American female who presents today for follow up of MDD. Patient reports that she continues to do well. States she is consistent with taking her Zoloft but takes the trazodone as needed. She completed her class and states she passed it by margin. Reports that she is working on improving relationships equal and wants to socialize more.  We discussed that she should discussed strategies with her therapist. Patient is willing to do that. Sleeping well and in trying to improve her diet. Denies any suicidal thoughts.  Past Psychiatric History: was seeing a psychiatrist at [facility]  Previous Psychotropic Medications: Yes   Substance Abuse History in the last 12 months:  No.  Consequences of Substance Abuse: Negative  Past Medical History:  Past Medical History:  Diagnosis Date  . Asthma   . Diabetes mellitus without complication [facility])     Past Surgical History:  Procedure Laterality Date  . EYE SURGERY    . TONSILLECTOMY      Family Psychiatric History: Mother has anxiety, other family members have anxiety.  Family History:  Family History  Problem Relation Age of Onset  . Anxiety disorder Mother   . Depression Mother   . Alcohol abuse Father   . Drug abuse Father   . Schizophrenia Maternal Uncle     Social History:   Social History   Socioeconomic History  . Marital status: Single    Spouse name: Not on file  . Number of children: 0  . Years of education: Not on file  . Highest education level: Associate degree: occupational, Hotel manager, or vocational program   Occupational History  . Not on file  Social Needs  . Financial resource strain: Somewhat hard  . Food insecurity:    Worry: Never true    Inability: Never true  . Transportation needs:    Medical: No    Non-medical: No  Tobacco Use  . Smoking status: Former Research scientist (life sciences)  . Smokeless tobacco: Never Used  Substance and Sexual Activity  . Alcohol use: No  . Drug use: No  . Sexual activity: Yes    Birth control/protection: Implant  Lifestyle  . Physical activity:    Days per week: 0 days    Minutes per session: Not on file  . Stress: Rather much  Relationships  . Social connections:    Talks on phone: More than three times a week    Gets together: More than three times a week    Attends religious service: More than 4 times per year    Active member of club or organization: No    Attends meetings of clubs or organizations: Never    Relationship status: Never married  Other Topics Concern  . Not on file  Social History Narrative  . Not on file    Additional Social History: Lives with her mother and grandparents  Allergies:   Allergies  Allergen Reactions  . Tramadol Other (See Comments)    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    Metabolic Disorder Labs: Lab Results  Component Value Date   HGBA1C 6.0 (H) 12/07/2015  MPG 126 12/07/2015   Lab Results  Component Value Date   PROLACTIN 14.1 12/07/2015   Lab Results  Component Value Date   CHOL 321 (H) 12/07/2015   TRIG 71 12/07/2015   HDL 41 12/07/2015   CHOLHDL 7.8 12/07/2015   VLDL 14 12/07/2015   LDLCALC 266 (H) 12/07/2015     Current Medications: Current Outpatient Medications  Medication Sig Dispense Refill  . danazol (DANOCRINE) 200 MG capsule Take 200 mg by mouth 2 (two) times daily.  11  . etonogestrel (NEXPLANON) 68 MG IMPL implant 68 mg.    . fluticasone (FLOVENT HFA) 110 MCG/ACT inhaler Inhale into the lungs 2 (two) times daily.    Marland Kitchen letrozole (FEMARA) 2.5 MG tablet Take 2.5 mg by mouth daily.  11   . montelukast (SINGULAIR) 10 MG tablet     . rosuvastatin (CRESTOR) 20 MG tablet     . sertraline (ZOLOFT) 25 MG tablet Take 1 tablet (25 mg total) by mouth daily. 30 tablet 2  . traZODone (DESYREL) 50 MG tablet Take 1 tablet (50 mg total) by mouth at bedtime. 30 tablet 2   No current facility-administered medications for this visit.     Neurologic: Headache: No Seizure: No Paresthesias:No  Musculoskeletal: Strength & Muscle Tone: within normal limits Gait & Station: normal Patient leans: N/A  Psychiatric Specialty Exam: ROS  Blood pressure (!) 142/78, pulse 89, temperature 98.4 F (36.9 C), temperature source Oral, weight (!) 139 kg (306 lb 6.4 oz), last menstrual period 08/05/2017.Body mass index is 46.59 kg/m.  General Appearance: Casual  Eye Contact:  Good  Speech:  Clear and Coherent  Volume:  Normal  Mood:  improved  Affect:  pleasant  Thought Process:  Coherent  Orientation:  Full (Time, Place, and Person)  Thought Content:  Logical  Suicidal Thoughts:  No  Homicidal Thoughts:  No  Memory:  Immediate;   Fair Recent;   Fair Remote;   Fair  Judgement:  Fair  Insight:  Fair  Psychomotor Activity:  Normal  Concentration:  Concentration: Fair and Attention Span: Fair  Recall:  [employer] of Knowledge:Fair  Language: Fair  Akathisia:  No  Handed:  Right  AIMS (if indicated):  na  Assets:  Communication Skills Desire for Improvement Financial Resources/Insurance Housing Physical Health Resilience Social Support Vocational/Educational  ADL's:  Intact  Cognition: WNL  Sleep: better    Treatment Plan Summary: Major depressive disorder moderate  Continue Zoloft at 25 mg once daily.   Obesity- Patient to f/u with her PCP and address her weight. Recommend working with a dietician or nutritionist.  Insomnia Continue trazodone at 50mg  po qhs.  Return to clinic in 3 months time or call before if needed.   Elvin So, MD 7/23/201910:35 AM

## 2017-11-18 ENCOUNTER — Encounter: Payer: Self-pay | Admitting: Psychiatry

## 2017-11-18 ENCOUNTER — Ambulatory Visit (INDEPENDENT_AMBULATORY_CARE_PROVIDER_SITE_OTHER): Payer: BLUE CROSS/BLUE SHIELD | Admitting: Psychiatry

## 2017-11-18 VITALS — BP 136/79 | HR 94 | Ht 68.0 in | Wt 303.0 lb

## 2017-11-18 DIAGNOSIS — F331 Major depressive disorder, recurrent, moderate: Secondary | ICD-10-CM

## 2017-11-18 MED ORDER — SERTRALINE HCL 50 MG PO TABS: 50.0000 mg | ORAL_TABLET | Freq: Every day | ORAL | 2 refills | Status: DC

## 2017-11-18 MED ORDER — TRAZODONE HCL 50 MG PO TABS
50.0000 mg | ORAL_TABLET | Freq: Every day | ORAL | 2 refills | Status: DC
Start: 2017-11-18 — End: 2018-06-18

## 2017-11-18 NOTE — Progress Notes (Signed)
Psychiatric progress note  Patient Identification: Shari [name] MRN:  [id number] Date of Evaluation:  11/18/2017 Referral Source: Uvaldo Bristle- flowers LCSWA Chief Complaint:   anxious lately Visit Diagnosis:    ICD-10-CM   1. MDD (major depressive disorder), recurrent episode, moderate (HCC) F33.1     History of Present Illness:  Patient is a 22 year old African-American female who presents today for follow up of MDD. Patient reports that she continues to do well. States she is consistent with taking her Zoloft but takes the trazodone as needed. Reports being stressed out for past 2 months. States her classes are going well, not stressed about them. She is feeling overwhelmed by other things. More anxious lately, happens randomly. Sleeping well overall. Denies any suicidal thoughts.  Past Psychiatric History: was seeing a psychiatrist at [facility]  Previous Psychotropic Medications: Yes   Substance Abuse History in the last 12 months:  No.  Consequences of Substance Abuse: Negative  Past Medical History:  Past Medical History:  Diagnosis Date  . Asthma   . Diabetes mellitus without complication [facility])     Past Surgical History:  Procedure Laterality Date  . EYE SURGERY    . TONSILLECTOMY      Family Psychiatric History: Mother has anxiety, other family members have anxiety.  Family History:  Family History  Problem Relation Age of Onset  . Anxiety disorder Mother   . Depression Mother   . Alcohol abuse Father   . Drug abuse Father   . Schizophrenia Maternal Uncle     Social History:   Social History   Socioeconomic History  . Marital status: Single    Spouse name: Not on file  . Number of children: 0  . Years of education: Not on file  . Highest education level: Associate degree: occupational, Hotel manager, or vocational program  Occupational History  . Not on file  Social Needs  . Financial resource strain: Somewhat hard  . Food insecurity:    Worry: Never  true    Inability: Never true  . Transportation needs:    Medical: No    Non-medical: No  Tobacco Use  . Smoking status: Former Research scientist (life sciences)  . Smokeless tobacco: Never Used  Substance and Sexual Activity  . Alcohol use: No  . Drug use: No  . Sexual activity: Yes    Birth control/protection: Implant  Lifestyle  . Physical activity:    Days per week: 0 days    Minutes per session: Not on file  . Stress: Rather much  Relationships  . Social connections:    Talks on phone: More than three times a week    Gets together: More than three times a week    Attends religious service: More than 4 times per year    Active member of club or organization: No    Attends meetings of clubs or organizations: Never    Relationship status: Never married  Other Topics Concern  . Not on file  Social History Narrative  . Not on file    Additional Social History: Lives with her mother and grandparents  Allergies:   Allergies  Allergen Reactions  . Tramadol Other (See Comments)    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    Metabolic Disorder Labs: Lab Results  Component Value Date   HGBA1C 6.0 (H) 12/07/2015   MPG 126 12/07/2015   Lab Results  Component Value Date   PROLACTIN 14.1 12/07/2015   Lab Results  Component Value Date   CHOL 321 (H)  12/07/2015   TRIG 71 12/07/2015   HDL 41 12/07/2015   CHOLHDL 7.8 12/07/2015   VLDL 14 12/07/2015   LDLCALC 266 (H) 12/07/2015     Current Medications: Current Outpatient Medications  Medication Sig Dispense Refill  . etonogestrel (NEXPLANON) 68 MG IMPL implant 68 mg.    . fluticasone (FLOVENT HFA) 110 MCG/ACT inhaler Inhale into the lungs 2 (two) times daily.    Marland Kitchen letrozole (FEMARA) 2.5 MG tablet Take 2.5 mg by mouth daily.  11  . montelukast (SINGULAIR) 10 MG tablet     . rosuvastatin (CRESTOR) 20 MG tablet     . sertraline (ZOLOFT) 25 MG tablet Take 1 tablet (25 mg total) by mouth daily. 30 tablet 2  . traZODone (DESYREL) 50 MG tablet Take  1 tablet (50 mg total) by mouth at bedtime. 30 tablet 2  . danazol (DANOCRINE) 200 MG capsule Take 200 mg by mouth 2 (two) times daily.  11   No current facility-administered medications for this visit.     Neurologic: Headache: No Seizure: No Paresthesias:No  Musculoskeletal: Strength & Muscle Tone: within normal limits Gait & Station: normal Patient leans: N/A  Psychiatric Specialty Exam: ROS  Blood pressure 136/79, pulse 94, height 5\' 8"  (1.727 m), weight (!) 303 lb (137.4 kg), SpO2 99 %.Body mass index is 46.07 kg/m.  General Appearance: Casual  Eye Contact:  Good  Speech:  Clear and Coherent  Volume:  Normal  Mood:  anxious  Affect:  pleasant  Thought Process:  Coherent  Orientation:  Full (Time, Place, and Person)  Thought Content:  Logical  Suicidal Thoughts:  No  Homicidal Thoughts:  No  Memory:  Immediate;   Fair Recent;   Fair Remote;   Fair  Judgement:  Fair  Insight:  Fair  Psychomotor Activity:  Normal  Concentration:  Concentration: Fair and Attention Span: Fair  Recall:  [employer] of Knowledge:Fair  Language: Fair  Akathisia:  No  Handed:  Right  AIMS (if indicated):  na  Assets:  Communication Skills Desire for Improvement Financial Resources/Insurance Housing Physical Health Resilience Social Support Vocational/Educational  ADL's:  Intact  Cognition: WNL  Sleep: better    Treatment Plan Summary: Major depressive disorder moderate  Increase Zoloft to 50 mg once daily.   Obesity- Patient to f/u with her PCP and address her weight. Recommend working with a dietician or nutritionist.  Insomnia Continue trazodone at 50mg  po qhs.  Return to clinic in 1 months time or call before if needed.   Elvin So, MD 10/22/20191:14 PM

## 2017-12-16 ENCOUNTER — Ambulatory Visit: Payer: BLUE CROSS/BLUE SHIELD | Admitting: Psychiatry

## 2017-12-16 ENCOUNTER — Encounter: Payer: Self-pay | Admitting: Psychiatry

## 2017-12-16 ENCOUNTER — Other Ambulatory Visit: Payer: Self-pay

## 2017-12-16 VITALS — BP 135/80 | HR 105 | Temp 98.7°F | Wt 304.4 lb

## 2017-12-16 DIAGNOSIS — F331 Major depressive disorder, recurrent, moderate: Secondary | ICD-10-CM | POA: Diagnosis not present

## 2017-12-16 NOTE — Progress Notes (Signed)
Psychiatric progress note  Patient Identification: Shari [name] MRN:  [id number] Date of Evaluation:  12/16/2017 Referral Source: Uvaldo Bristle- flowers LCSWA Chief Complaint:   anxious lately Visit Diagnosis:    ICD-10-CM   1. MDD (major depressive disorder), recurrent episode, moderate (HCC) F33.1     History of Present Illness:  Patient is a 22 year old African-American female who presents today for follow up of MDD. Patient reports that she has tolerated the increase in zoloft well. She is less overwhelmed. States she is getting stressed due to her exams coming up. She is taking 5 courses this semester, planning to take 2 classes next semester. She is working in Scientist, research (medical). She has been eating more ,says its stress eating. She takes trazodone on the weekends.  Denies any suicidal thoughts, overall doing better.   Past Psychiatric History: was seeing a psychiatrist at [facility]  Previous Psychotropic Medications: Yes   Substance Abuse History in the last 12 months:  No.  Consequences of Substance Abuse: Negative  Past Medical History:  Past Medical History:  Diagnosis Date  . Asthma   . Diabetes mellitus without complication [facility])     Past Surgical History:  Procedure Laterality Date  . EYE SURGERY    . TONSILLECTOMY      Family Psychiatric History: Mother has anxiety, other family members have anxiety.  Family History:  Family History  Problem Relation Age of Onset  . Anxiety disorder Mother   . Depression Mother   . Alcohol abuse Father   . Drug abuse Father   . Schizophrenia Maternal Uncle     Social History:   Social History   Socioeconomic History  . Marital status: Single    Spouse name: Not on file  . Number of children: 0  . Years of education: Not on file  . Highest education level: Associate degree: occupational, Hotel manager, or vocational program  Occupational History  . Not on file  Social Needs  . Financial resource strain: Somewhat hard  . Food  insecurity:    Worry: Never true    Inability: Never true  . Transportation needs:    Medical: No    Non-medical: No  Tobacco Use  . Smoking status: Former Research scientist (life sciences)  . Smokeless tobacco: Never Used  Substance and Sexual Activity  . Alcohol use: No  . Drug use: No  . Sexual activity: Yes    Birth control/protection: Implant  Lifestyle  . Physical activity:    Days per week: 0 days    Minutes per session: Not on file  . Stress: Rather much  Relationships  . Social connections:    Talks on phone: More than three times a week    Gets together: More than three times a week    Attends religious service: More than 4 times per year    Active member of club or organization: No    Attends meetings of clubs or organizations: Never    Relationship status: Never married  Other Topics Concern  . Not on file  Social History Narrative  . Not on file    Additional Social History: Lives with her mother and grandparents  Allergies:   Allergies  Allergen Reactions  . Tramadol Other (See Comments)    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    Metabolic Disorder Labs: Lab Results  Component Value Date   HGBA1C 6.0 (H) 12/07/2015   MPG 126 12/07/2015   Lab Results  Component Value Date   PROLACTIN 14.1 12/07/2015   Lab  Results  Component Value Date   CHOL 321 (H) 12/07/2015   TRIG 71 12/07/2015   HDL 41 12/07/2015   CHOLHDL 7.8 12/07/2015   VLDL 14 12/07/2015   LDLCALC 266 (H) 12/07/2015     Current Medications: Current Outpatient Medications  Medication Sig Dispense Refill  . danazol (DANOCRINE) 200 MG capsule Take 200 mg by mouth 2 (two) times daily.  11  . etonogestrel (NEXPLANON) 68 MG IMPL implant 68 mg.    . fluticasone (FLOVENT HFA) 110 MCG/ACT inhaler Inhale into the lungs 2 (two) times daily.    Marland Kitchen letrozole (FEMARA) 2.5 MG tablet Take 2.5 mg by mouth daily.  11  . montelukast (SINGULAIR) 10 MG tablet     . rosuvastatin (CRESTOR) 20 MG tablet     . sertraline  (ZOLOFT) 50 MG tablet Take 1 tablet (50 mg total) by mouth daily. 30 tablet 2  . traZODone (DESYREL) 50 MG tablet Take 1 tablet (50 mg total) by mouth at bedtime. 30 tablet 2   No current facility-administered medications for this visit.     Neurologic: Headache: No Seizure: No Paresthesias:No  Musculoskeletal: Strength & Muscle Tone: within normal limits Gait & Station: normal Patient leans: N/A  Psychiatric Specialty Exam: ROS  There were no vitals taken for this visit.There is no height or weight on file to calculate BMI.  General Appearance: Casual  Eye Contact:  Good  Speech:  Clear and Coherent  Volume:  Normal  Mood:  better  Affect:  pleasant  Thought Process:  Coherent  Orientation:  Full (Time, Place, and Person)  Thought Content:  Logical  Suicidal Thoughts:  No  Homicidal Thoughts:  No  Memory:  Immediate;   Fair Recent;   Fair Remote;   Fair  Judgement:  Fair  Insight:  Fair  Psychomotor Activity:  Normal  Concentration:  Concentration: Fair and Attention Span: Fair  Recall:  [employer] of Knowledge:Fair  Language: Fair  Akathisia:  No  Handed:  Right  AIMS (if indicated):  na  Assets:  Communication Skills Desire for Improvement Financial Resources/Insurance Housing Physical Health Resilience Social Support Vocational/Educational  ADL's:  Intact  Cognition: WNL  Sleep: better    Treatment Plan Summary: Major depressive disorder moderate  continue Zoloft at 50 mg once daily.   Obesity- Patient to f/u with her PCP and address her weight. Recommend working with a dietician or nutritionist.  Insomnia Continue trazodone at 50mg  po qhs.  Return to clinic in 2 months time or call before if needed. Patient aware she will see a different provider at next visit.   Elvin So, MD 11/19/20192:00 PM

## 2018-02-02 ENCOUNTER — Other Ambulatory Visit: Payer: Self-pay

## 2018-02-02 ENCOUNTER — Encounter
Admission: RE | Admit: 2018-02-02 | Discharge: 2018-02-02 | Disposition: A | Discharge status: Home / Self Care | Payer: BLUE CROSS/BLUE SHIELD | Source: Ambulatory Visit | Attending: Otolaryngology | Admitting: Otolaryngology

## 2018-02-02 HISTORY — DX: Other specified health status: Z78.9

## 2018-02-02 HISTORY — DX: Anxiety disorder, unspecified: F41.9

## 2018-02-02 HISTORY — DX: Pneumonia, unspecified organism: J18.9

## 2018-02-02 NOTE — Patient Instructions (Signed)
Your procedure is scheduled on: 02-09-18 MONDAY Report to Same Day Surgery 2nd floor medical mall [facility] Entrance-take elevator on left to 2nd floor.  Check in with surgery information desk.) To find out your arrival time please call [phone] between 1PM - 3PM on 02-06-18 FRIDAY  Remember: Instructions that are not followed completely may result in serious medical risk, up to and including death, or upon the discretion of your surgeon and anesthesiologist your surgery may need to be rescheduled.    _x___ 1. Do not eat food after midnight the night before your procedure. NO GUM OR CANDY AFTER MIDNIGHT.  You may drink clear liquids up to 2 hours before you are scheduled to arrive at the hospital for your procedure.  Do not drink clear liquids within 2 hours of your scheduled arrival to the hospital.  Clear liquids include  --Water or Apple juice without pulp  --Clear carbohydrate beverage such as ClearFast or Gatorade  --Black Coffee or Clear Tea (No milk, no creamers, do not add anything to the coffee or Tea   ____Ensure clear carbohydrate drink on the way to the hospital for bariatric patients  ____Ensure clear carbohydrate drink 3 hours before surgery for Dr Dwyane Luo patients if physician instructed.    __x__ 2. No Alcohol for 24 hours before or after surgery.   __x__3. No Smoking or e-cigarettes for 24 prior to surgery.  Do not use any chewable tobacco products for at least 6 hour prior to surgery   ____  4. Bring all medications with you on the day of surgery if instructed.    __x__ 5. Notify your doctor if there is any change in your medical condition     (cold, fever, infections).    x___6. On the morning of surgery brush your teeth with toothpaste and water.  You may rinse your mouth with mouth wash if you wish.  Do not swallow any toothpaste or mouthwash.   Do not wear jewelry, make-up, hairpins, clips or nail polish.  Do not wear lotions, powders, or perfumes. You  may wear deodorant.  Do not shave 48 hours prior to surgery. Men may shave face and neck.  Do not bring valuables to the hospital.    [facility] is not responsible for any belongings or valuables.               Contacts, dentures or bridgework may not be worn into surgery.  Leave your suitcase in the car. After surgery it may be brought to your room.  For patients admitted to the hospital, discharge time is determined by your treatment team.  _  Patients discharged the day of surgery will not be allowed to drive home.  You will need someone to drive you home and stay with you the night of your procedure.    Please read over the following fact sheets that you were given:   [facility] Preparing for Surgery   _x___ TAKE THE FOLLOWING MEDICATION THE MORNING OF SURGERY WITH SMALL SIP OF WATER. These include:  1. ZOLOFT (SERTRALINE)  2.  3.  4.  5.  6.  ____Fleets enema or Magnesium Citrate as directed.   ____ Use CHG Soap or sage wipes as directed on instruction sheet   _X___ Use inhalers on the day of surgery and bring to hospital day of [place]  ____ Stop Metformin and Janumet 2 days prior to surgery.  ____ Take 1/2 of usual insulin dose the night before surgery and none on the morning surgery.   ____ Follow recommendations from Cardiologist, Pulmonologist or PCP regarding stopping Aspirin, Coumadin, Plavix ,Eliquis, Effient, or Pradaxa, and Pletal.  X____Stop Anti-inflammatories such as Advil, Aleve, Ibuprofen, Motrin, Naproxen, Naprosyn, Goodies powders or aspirin products NOW-OK to take Tylenol    ____ Stop supplements until after surgery.    ____ Bring C-Pap to the hospital.

## 2018-02-03 ENCOUNTER — Encounter
Admission: RE | Admit: 2018-02-03 | Discharge: 2018-02-03 | Disposition: A | Discharge status: Home / Self Care | Payer: BLUE CROSS/BLUE SHIELD | Source: Ambulatory Visit | Attending: Otolaryngology | Admitting: Otolaryngology

## 2018-02-03 DIAGNOSIS — E119 Type 2 diabetes mellitus without complications: Secondary | ICD-10-CM | POA: Insufficient documentation

## 2018-02-03 DIAGNOSIS — Z01818 Encounter for other preprocedural examination: Secondary | ICD-10-CM | POA: Insufficient documentation

## 2018-02-03 DIAGNOSIS — R000 Tachycardia, unspecified: Secondary | ICD-10-CM | POA: Diagnosis not present

## 2018-02-03 LAB — BASIC METABOLIC PANEL
Anion gap: 6 (ref 5–15)
BUN: 13 mg/dL (ref 6–20)
CHLORIDE: 106 mmol/L (ref 98–111)
CO2: 23 mmol/L (ref 22–32)
Calcium: 8.6 mg/dL — ABNORMAL LOW (ref 8.9–10.3)
Creatinine, Ser: 0.67 mg/dL (ref 0.44–1.00)
GFR calc Af Amer: >60 mL/min (ref >60)
GFR calc non Af Amer: >60 mL/min (ref >60)
Glucose, Bld: 292 mg/dL — ABNORMAL HIGH (ref 70–99)
Potassium: 3.6 mmol/L (ref 3.5–5.1)
Sodium: 135 mmol/L (ref 135–145)

## 2018-02-03 LAB — HEMOGLOBIN: Hemoglobin: 11.6 g/dL — ABNORMAL LOW (ref 12.0–15.0)

## 2018-02-09 ENCOUNTER — Ambulatory Visit: Payer: BLUE CROSS/BLUE SHIELD | Admitting: Certified Registered Nurse Anesthetist

## 2018-02-09 ENCOUNTER — Ambulatory Visit
Admission: RE | Admit: 2018-02-09 | Discharge: 2018-02-09 | Disposition: A | Discharge status: Home / Self Care | Payer: BLUE CROSS/BLUE SHIELD | Attending: Otolaryngology | Admitting: Otolaryngology

## 2018-02-09 ENCOUNTER — Encounter: Payer: Self-pay | Admitting: *Deleted

## 2018-02-09 ENCOUNTER — Encounter
Admission: RE | Disposition: A | Discharge status: Home / Self Care | Payer: Self-pay | Source: Home / Self Care | Attending: Otolaryngology

## 2018-02-09 DIAGNOSIS — H6981 Other specified disorders of Eustachian tube, right ear: Secondary | ICD-10-CM | POA: Diagnosis not present

## 2018-02-09 DIAGNOSIS — H6591 Unspecified nonsuppurative otitis media, right ear: Secondary | ICD-10-CM | POA: Diagnosis not present

## 2018-02-09 DIAGNOSIS — J343 Hypertrophy of nasal turbinates: Secondary | ICD-10-CM | POA: Insufficient documentation

## 2018-02-09 DIAGNOSIS — J352 Hypertrophy of adenoids: Secondary | ICD-10-CM | POA: Insufficient documentation

## 2018-02-09 HISTORY — PX: ADENOIDECTOMY: SHX5191

## 2018-02-09 HISTORY — PX: TURBINATE REDUCTION: SHX6157

## 2018-02-09 HISTORY — PX: NASOPHARYNGOSCOPY EUSTATION TUBE BALLOON DILATION: SHX6729

## 2018-02-09 HISTORY — PX: MYRINGOTOMY WITH TUBE PLACEMENT: SHX5663

## 2018-02-09 LAB — POCT PREGNANCY, URINE: Preg Test, Ur: NEGATIVE

## 2018-02-09 LAB — GLUCOSE, CAPILLARY: Glucose-Capillary: 106 mg/dL — ABNORMAL HIGH (ref 70–99)

## 2018-02-09 SURGERY — MYRINGOTOMY WITH TUBE PLACEMENT
Anesthesia: General | Laterality: Right

## 2018-02-09 MED ORDER — LIDOCAINE-EPINEPHRINE 1 %-1:100000 IJ SOLN
INTRAMUSCULAR | Status: AC
  Filled 2018-02-09: qty 1

## 2018-02-09 MED ORDER — PROPOFOL 10 MG/ML IV BOLUS
INTRAVENOUS | Status: DC | PRN
  Administered 2018-02-09: 50 mg via INTRAVENOUS
  Administered 2018-02-09: 200 mg via INTRAVENOUS

## 2018-02-09 MED ORDER — ONDANSETRON HCL 4 MG PO TABS: 4.0000 mg | ORAL_TABLET | Freq: Three times a day (TID) | ORAL | 0 refills | Status: DC | PRN

## 2018-02-09 MED ORDER — PROPOFOL 10 MG/ML IV BOLUS
INTRAVENOUS | Status: AC
  Filled 2018-02-09: qty 20

## 2018-02-09 MED ORDER — FENTANYL CITRATE (PF) 100 MCG/2ML IJ SOLN
INTRAMUSCULAR | Status: AC
  Administered 2018-02-09: 25 ug via INTRAVENOUS
  Filled 2018-02-09: qty 2

## 2018-02-09 MED ORDER — FAMOTIDINE 20 MG PO TABS
ORAL_TABLET | ORAL | Status: AC
  Filled 2018-02-09: qty 1

## 2018-02-09 MED ORDER — FENTANYL CITRATE (PF) 100 MCG/2ML IJ SOLN
INTRAMUSCULAR | Status: DC | PRN
  Administered 2018-02-09: 100 ug via INTRAVENOUS

## 2018-02-09 MED ORDER — ONDANSETRON HCL 4 MG/2ML IJ SOLN: 4.0000 mg | Freq: Once | INTRAMUSCULAR | Status: DC | PRN

## 2018-02-09 MED ORDER — REMIFENTANIL HCL 1 MG IV SOLR
INTRAVENOUS | Status: AC
  Filled 2018-02-09: qty 1000

## 2018-02-09 MED ORDER — HYDROCODONE-ACETAMINOPHEN 5-325 MG PO TABS
ORAL_TABLET | ORAL | Status: AC
  Filled 2018-02-09: qty 1

## 2018-02-09 MED ORDER — LIDOCAINE HCL (CARDIAC) PF 100 MG/5ML IV SOSY
PREFILLED_SYRINGE | INTRAVENOUS | Status: DC | PRN
  Administered 2018-02-09: 100 mg via INTRAVENOUS

## 2018-02-09 MED ORDER — CIPROFLOXACIN-DEXAMETHASONE 0.3-0.1 % OT SUSP
OTIC | Status: DC | PRN
  Administered 2018-02-09: 4 [drp] via OTIC

## 2018-02-09 MED ORDER — CIPROFLOXACIN-DEXAMETHASONE 0.3-0.1 % OT SUSP: 4.0000 [drp] | Freq: Two times a day (BID) | OTIC | 0 refills | Status: AC

## 2018-02-09 MED ORDER — SUCCINYLCHOLINE CHLORIDE 20 MG/ML IJ SOLN
INTRAMUSCULAR | Status: DC | PRN
  Administered 2018-02-09: 140 mg via INTRAVENOUS

## 2018-02-09 MED ORDER — HYDROCODONE-ACETAMINOPHEN 5-325 MG PO TABS: 1.0000 | ORAL_TABLET | ORAL | 0 refills | Status: DC | PRN

## 2018-02-09 MED ORDER — GLYCOPYRROLATE 0.2 MG/ML IJ SOLN
INTRAMUSCULAR | Status: DC | PRN
  Administered 2018-02-09: 0.2 mg via INTRAVENOUS

## 2018-02-09 MED ORDER — REMIFENTANIL HCL 1 MG IV SOLR
INTRAVENOUS | Status: DC | PRN
  Administered 2018-02-09: .2 ug/kg/min via INTRAVENOUS

## 2018-02-09 MED ORDER — LACTATED RINGERS IV SOLN
INTRAVENOUS | Status: DC | PRN
  Administered 2018-02-09: 07:00:00 via INTRAVENOUS

## 2018-02-09 MED ORDER — DEXAMETHASONE SODIUM PHOSPHATE 10 MG/ML IJ SOLN
INTRAMUSCULAR | Status: DC | PRN
  Administered 2018-02-09: 10 mg via INTRAVENOUS

## 2018-02-09 MED ORDER — ACETAMINOPHEN 10 MG/ML IV SOLN
INTRAVENOUS | Status: DC | PRN
  Administered 2018-02-09: 1000 mg via INTRAVENOUS

## 2018-02-09 MED ORDER — OXYMETAZOLINE HCL 0.05 % NA SOLN
NASAL | Status: AC
  Filled 2018-02-09: qty 15

## 2018-02-09 MED ORDER — ROCURONIUM BROMIDE 100 MG/10ML IV SOLN
INTRAVENOUS | Status: DC | PRN
  Administered 2018-02-09: 5 mg via INTRAVENOUS

## 2018-02-09 MED ORDER — MIDAZOLAM HCL 2 MG/2ML IJ SOLN
INTRAMUSCULAR | Status: DC | PRN
  Administered 2018-02-09: 2 mg via INTRAVENOUS

## 2018-02-09 MED ORDER — CIPROFLOXACIN-DEXAMETHASONE 0.3-0.1 % OT SUSP
OTIC | Status: AC
  Filled 2018-02-09: qty 7.5

## 2018-02-09 MED ORDER — BACITRACIN ZINC 500 UNIT/GM EX OINT
TOPICAL_OINTMENT | CUTANEOUS | Status: AC
  Filled 2018-02-09: qty 28.35

## 2018-02-09 MED ORDER — SODIUM CHLORIDE 0.9 % IV SOLN
INTRAVENOUS | Status: DC | PRN
  Administered 2018-02-09: 50 ug/min via INTRAVENOUS

## 2018-02-09 MED ORDER — FENTANYL CITRATE (PF) 100 MCG/2ML IJ SOLN
25.0000 ug | INTRAMUSCULAR | Status: DC | PRN
  Administered 2018-02-09 (×2): 25 ug via INTRAVENOUS

## 2018-02-09 MED ORDER — PHENYLEPHRINE HCL 10 MG/ML IJ SOLN
INTRAMUSCULAR | Status: DC | PRN
  Administered 2018-02-09: 100 ug via INTRAVENOUS

## 2018-02-09 MED ORDER — SODIUM CHLORIDE 0.9 % IV SOLN
INTRAVENOUS | Status: DC
  Administered 2018-02-09: 07:00:00 via INTRAVENOUS

## 2018-02-09 MED ORDER — PREDNISONE 10 MG (21) PO TBPK: ORAL_TABLET | ORAL | 0 refills | Status: DC

## 2018-02-09 MED ORDER — ONDANSETRON HCL 4 MG/2ML IJ SOLN
INTRAMUSCULAR | Status: DC | PRN
  Administered 2018-02-09: 4 mg via INTRAVENOUS

## 2018-02-09 MED ORDER — FENTANYL CITRATE (PF) 100 MCG/2ML IJ SOLN
INTRAMUSCULAR | Status: AC
  Filled 2018-02-09: qty 2

## 2018-02-09 MED ORDER — OXYMETAZOLINE HCL 0.05 % NA SOLN
NASAL | Status: DC | PRN
  Administered 2018-02-09: 1

## 2018-02-09 MED ORDER — FAMOTIDINE 20 MG PO TABS
20.0000 mg | ORAL_TABLET | Freq: Once | ORAL | Status: AC
  Administered 2018-02-09: 20 mg via ORAL

## 2018-02-09 MED ORDER — MIDAZOLAM HCL 2 MG/2ML IJ SOLN
INTRAMUSCULAR | Status: AC
  Filled 2018-02-09: qty 2

## 2018-02-09 MED ORDER — HYDROCODONE-ACETAMINOPHEN 5-325 MG PO TABS
1.0000 | ORAL_TABLET | ORAL | Status: DC | PRN
  Administered 2018-02-09: 1 via ORAL

## 2018-02-09 SURGICAL SUPPLY — 33 items
BLADE MYR LANCE NRW W/HDL (BLADE) ×3 IMPLANT
BLADE SURG 15 STRL LF DISP TIS (BLADE) ×2 IMPLANT
BLADE SURG 15 STRL SS (BLADE) ×1
CANISTER SUCT 1200ML W/VALVE (MISCELLANEOUS) ×3 IMPLANT
CATH ROBINSON RED A/P 8FR (CATHETERS) ×3 IMPLANT
COAG SUCT 10F 3.5MM HAND CTRL (MISCELLANEOUS) ×4 IMPLANT
COTTON BALL STRL MEDIUM (GAUZE/BANDAGES/DRESSINGS) ×3 IMPLANT
COVER WAND RF STERILE (DRAPES) ×2 IMPLANT
DEVICE INFLATION SEID (MISCELLANEOUS) ×1 IMPLANT
DRESSING NASL FOAM PST OP SINU (MISCELLANEOUS) ×2 IMPLANT
DRSG NASAL FOAM POST OP SINU (MISCELLANEOUS)
ELECT REM PT RETURN 9FT ADLT (ELECTROSURGICAL) ×3
ELECTRODE REM PT RTRN 9FT ADLT (ELECTROSURGICAL) ×2 IMPLANT
GAUZE PACK 2X3YD (GAUZE/BANDAGES/DRESSINGS) ×2 IMPLANT
GLOVE BIO SURGEON STRL SZ7.5 (GLOVE) ×5 IMPLANT
GLOVE BIOGEL M STRL SZ7.5 (GLOVE) ×3 IMPLANT
GOWN STRL REUS W/ TWL LRG LVL3 (GOWN DISPOSABLE) ×4 IMPLANT
GOWN STRL REUS W/TWL LRG LVL3 (GOWN DISPOSABLE) ×2
HANDLE SUCTION POOLE (INSTRUMENTS) ×2 IMPLANT
KIT TURNOVER KIT A (KITS) ×3 IMPLANT
LABEL OR SOLS (LABEL) ×3 IMPLANT
NS IRRIG 500ML POUR BTL (IV SOLUTION) ×3 IMPLANT
PACK HEAD/NECK (MISCELLANEOUS) ×3 IMPLANT
SOL ANTI-FOG 6CC FOG-OUT (MISCELLANEOUS) ×2 IMPLANT
SOL FOG-OUT ANTI-FOG 6CC (MISCELLANEOUS) ×1
SPONGE NEURO XRAY DETECT 1X3 (DISPOSABLE) ×3 IMPLANT
SPONGE TONSIL TAPE 1 RFD (DISPOSABLE) ×3 IMPLANT
SUCTION POOLE HANDLE (INSTRUMENTS) ×3
SYR 3ML LL SCALE MARK (SYRINGE) ×3 IMPLANT
SYR BULB IRRIG 60ML STRL (SYRINGE) ×3 IMPLANT
TOWEL OR 17X26 4PK STRL BLUE (TOWEL DISPOSABLE) ×3 IMPLANT
TUBE EAR ARMSTRONG HC 1.14X3.5 (OTOLOGIC RELATED) ×5 IMPLANT
TUBING CONNECTING 10 (TUBING) ×3 IMPLANT

## 2018-02-09 NOTE — Anesthesia Procedure Notes (Signed)
Procedure Name: Intubation Date/Time: 02/09/2018 7:32 AM Performed by: Willette Alma, CRNA Pre-anesthesia Checklist: Patient identified, Patient being monitored, Timeout performed, Emergency Drugs available and Suction available Patient Re-evaluated:Patient Re-evaluated prior to induction Oxygen Delivery Method: Circle system utilized Preoxygenation: Pre-oxygenation with 100% oxygen Induction Type: IV induction Ventilation: Mask ventilation without difficulty Laryngoscope Size: McGraph and 4 Grade View: Grade I Tube type: Oral Rae Tube size: 7.0 mm Number of attempts: 1 Airway Equipment and Method: Stylet Placement Confirmation: ETT inserted through vocal cords under direct vision,  positive ETCO2 and breath sounds checked- equal and bilateral Secured at: 21 cm Tube secured with: Tape Dental Injury: Teeth and Oropharynx as per pre-operative assessment  Comments: Atraumatic intubation. Lips and teeth as before.

## 2018-02-09 NOTE — H&P (Signed)
..  History and Physical paper copy reviewed and updated date of procedure and will be scanned into system.  Patient seen and examined.  

## 2018-02-09 NOTE — Op Note (Signed)
02/09/2018  8:12 AM    [name], Shari Bender  [id number]   Pre-Op Dx: Right eustachian tube dysfunction, nasal turbinate hypertrophy  Post-op Dx: Same  Proc: Right inferior turbinate outfracture, nasopharyngoscopy, eustachian tube dilation  Surg:  [name]  Anes:  GOT  EBL: Minimal  Comp: None  Findings: The right inferior turbinate was large enough that I needed to outfracture it to be able to get around it and into the eustachian tube.  There was a lot of mucosal swelling in the torus tubarius and adenoid tissue.  Procedure: The patient was given general anesthesia by oral endotracheal intubation.  She was having a right myringotomy and tube and adenoidectomy by Dr. Pryor Ochoa.  That was dictated in detail elsewhere.  Cottonoid pledgets soaked in Afrin and Xylocaine were placed into the nose for vasoconstriction.  Once this was allowed to sit for several minutes the pledgets were removed and a 0 degrees scope was placed through the right nostril.  The right inferior turbinate was enlarged and made it difficult to get to the torus tubarius.  The inferior turbinate was outfractured to provide better visualization through the nose.  The 30 degrees scope was used for visualizing through the left side of the nose torus tubarius as well.  There is lymphoid swelling of the torus tubarius on both sides and extra lymphoid tissue in the nasopharynx.  The Acclarent eustachian tube balloon with the passed through the right nostril into the nasopharynx.  This was done under direct visualization with the 0 degrees scope.  The tip of the balloon was then placed into the eustachian tube opening near its superior and and angled upward.  The balloon was passed easily into the eustachian tube for its complete length.  The balloon was then dilated to 8 cm of pressure and allowed to sit for a little over 2 minutes.  The balloon was then deflated and the balloon was removed from the eustachian tube.  This was  then removed from the nose.  There is no bleeding from the eustachian tube or trauma here.  There was a little bit of ooze from the right inferior turbinate where it had been outfractured.  Patient tolerated this part of the procedure very well she was turned back over to anesthesia and to Dr. Pryor Ochoa to complete his portion of the surgery.  Plan: She will be followed up in the office by Dr. Pryor Ochoa and an audiogram will be obtained in the future to make sure her hearing is normal and eustachian tube is open.  [name]  02/09/2018 8:12 AM

## 2018-02-09 NOTE — Transfer of Care (Signed)
Immediate Anesthesia Transfer of Care Note  Patient: Shari [name]  Procedure(s) Performed: MYRINGOTOMY WITH TUBE PLACEMENT (Right ) REVISION ADENOIDECTOMY (Bilateral ) NASOPHARYNGOSCOPY EUSTATION TUBE BALLOON DILATION (Bilateral ) OUTFRACTURE RIGHT INFERIOR TURBINATE (Right )  Patient Location: PACU  Anesthesia Type:General  Level of Consciousness: awake, alert  and oriented  Airway & Oxygen Therapy: Patient Spontanous Breathing and Patient connected to face mask oxygen  Post-op Assessment: Report given to RN, Post -op Vital signs reviewed and stable and Patient moving all extremities  Post vital signs: stable  Last Vitals:  Vitals Value Taken Time  BP 108/47 02/09/2018  8:35 AM  Temp    Pulse 102 02/09/2018  8:35 AM  Resp 16 02/09/2018  8:35 AM  SpO2 100 % 02/09/2018  8:35 AM  Vitals shown include unvalidated device data.  Last Pain:  Vitals:   02/09/18 0645  TempSrc: Oral  PainSc: 0-No pain         Complications: No apparent anesthesia complications

## 2018-02-09 NOTE — Anesthesia Preprocedure Evaluation (Signed)
Anesthesia Evaluation  Patient identified by MRN, date of birth, ID band Patient awake    Reviewed: Allergy & Precautions, H&P , NPO status , Patient's Chart, lab work & pertinent test results, reviewed documented beta blocker date and time   Airway Mallampati: IV  TM Distance: >3 FB Neck ROM: full    Dental  (+) Teeth Intact   Pulmonary neg shortness of breath, asthma , pneumonia,    Pulmonary exam normal        Cardiovascular Exercise Tolerance: Good negative cardio ROS Normal cardiovascular exam Rhythm:regular Rate:Normal     Neuro/Psych PSYCHIATRIC DISORDERS Anxiety Schizophrenia negative neurological ROS     GI/Hepatic negative GI ROS, Neg liver ROS,   Endo/Other  negative endocrine ROSdiabetes, Well Controlled, Type 2  Renal/GU negative Renal ROS  negative genitourinary   Musculoskeletal   Abdominal   Peds  Hematology negative hematology ROS (+)   Anesthesia Other Findings Past Medical History: No date: Anxiety No date: Asthma     Comment:  well controlled No date: Diabetes mellitus without complication (HCC)     Comment:  pt states she went off her meds because her numbers were              fine 01/2017: Pneumonia No date: Refusal of blood product Past Surgical History: No date: EYE SURGERY No date: TONSILLECTOMY BMI    Body Mass Index:  46.22 kg/m     Reproductive/Obstetrics negative OB ROS                             Anesthesia Physical Anesthesia Plan  ASA: III  Anesthesia Plan: General ETT   Post-op Pain Management:    Induction:   PONV Risk Score and Plan: 4 or greater  Airway Management Planned:   Additional Equipment:   Intra-op Plan:   Post-operative Plan:   Informed Consent: I have reviewed the patients History and Physical, chart, labs and discussed the procedure including the risks, benefits and alternatives for the proposed anesthesia with the  patient or authorized representative who has indicated his/her understanding and acceptance.   Dental Advisory Given  Plan Discussed with: CRNA  Anesthesia Plan Comments:         Anesthesia Quick Evaluation

## 2018-02-09 NOTE — Anesthesia Post-op Follow-up Note (Signed)
Anesthesia QCDR form completed.        

## 2018-02-09 NOTE — Op Note (Signed)
....  02/09/2018  8:21 AM    [name], Shari Bender  [id number]   Pre-Op Dx:  EUSTACHIAN TUBE DYSFUNCTION, HYPERTROPHY OF ADENOIDS  Post-op Dx: EUSTACHIAN TUBE DYSFUNCTION, HYPERTROPHY OF ADENOIDS  Proc:   1) Revision Adenoidectomy > age 23  2) Right Myringotomy and Tympanostomy Tube Placement   Surg: Sharvil Hoey  Anes:  General Endotracheal  EBL:  73ml  Comp:  None  Findings:  Diffuse lateral adenoid tissue hypertrophy with cryptic nature, right serous otitis media.  Limited ability to reduce adenoids given close location to eustachian tube.  Procedure done in conjunction with Dr. Kathyrn Sheriff performing Eustachian tuboplasty.  Procedure: After the patient was identified in holding and the history and physical and consent was reviewed, the patient was taken to the operating room and placed in a supine position.  General endotracheal anesthesia was induced in the normal fashion.  At an appropriate level, microscope and speculum were used to examine and clean the RIGHT ear canal.  The findings were as described above.  An posterior inferior radial myringotomy incision was sharply executed.  Middle ear contents were suctioned clear with a size 5 otologic suction.  A PE tube was placed without difficulty using a Rosen pick and Animal nutritionist.  Ciprodex otic solution was instilled into the external canal, and insufflated into the middle ear.  A cotton ball was placed at the external meatus. Hemostasis was observed.  This side was completed.    At this time, the patient was rotated 90 degrees.  Care of the patient was transferred to Dr. Kathyrn Sheriff for his portion of the Eustachian tuboplasty and turbinate reduction.  Please see his separate operative note for details.  Once his portion of the procedure was complete, attention was directed back to the patient's revision adenoidectomy.  At this time, a McIvor mouthgag was inserted into the patient's oral cavity and suspended from the Crosby stand without  injury to teeth, lips, or gums.  Next a red rubber catheter was inserted into the patient left nostril for retraction of the uvula and soft palate superiorly.  Attention was now directed to the patient's Adenoidectomy.  Under indirect visualization using an operating mirror, the adenoid tissue was visualized and noted to be obstructive in nature.  Using a Wainwright forceps, the adenoid tissue was de bulked and debrided for a widely patent choana.  Following debulking, the remaining adenoid tissue was ablated and desiccated with Bovie suction cautery.  Meticulous hemostasis was continued.  At this time, the patient's nasal cavity and oral cavity was irrigated with sterile saline.    Following this, the care of patient was returned to anesthesia, awakened, and transferred to recovery in stable condition.  Dispo:  PACU to home  Plan: Soft diet.  Limit exercise and strenuous activity for 2 weeks.  Fluid hydration  Recheck my office three weeks.  Routine drop use and water precautions.  Steroid taper.   Shari [name] 8:21 AM 02/09/2018

## 2018-02-09 NOTE — Discharge Instructions (Signed)

## 2018-02-10 LAB — SURGICAL PATHOLOGY

## 2018-02-11 NOTE — Anesthesia Postprocedure Evaluation (Signed)
Anesthesia Post Note  Patient: Dispensing optician  Procedure(s) Performed: MYRINGOTOMY WITH TUBE PLACEMENT (Right ) REVISION ADENOIDECTOMY (Bilateral ) NASOPHARYNGOSCOPY EUSTATION TUBE BALLOON DILATION (Bilateral ) OUTFRACTURE RIGHT INFERIOR TURBINATE (Right )  Patient location during evaluation: PACU Anesthesia Type: General Level of consciousness: awake and alert Pain management: pain level controlled Vital Signs Assessment: post-procedure vital signs reviewed and stable Respiratory status: spontaneous breathing, nonlabored ventilation, respiratory function stable and patient connected to nasal cannula oxygen Cardiovascular status: blood pressure returned to baseline and stable Postop Assessment: no apparent nausea or vomiting Anesthetic complications: no     Last Vitals:  Vitals:   02/09/18 0917 02/09/18 0941  BP: (!) 105/54 (!) 118/50  Pulse: 98 88  Resp: 16 16  Temp: (!) 36.2 C (!) 36.1 C  SpO2: 100% 100%    Last Pain:  Vitals:   02/10/18 0841  TempSrc:   PainSc: 0-No pain                 Molli Barrows

## 2018-02-12 ENCOUNTER — Encounter: Payer: Self-pay | Admitting: Otolaryngology

## 2018-02-12 ENCOUNTER — Ambulatory Visit: Payer: BLUE CROSS/BLUE SHIELD | Admitting: Psychiatry

## 2018-02-12 VITALS — BP 135/76 | HR 98 | Ht 67.5 in | Wt 302.0 lb

## 2018-02-12 DIAGNOSIS — F411 Generalized anxiety disorder: Secondary | ICD-10-CM | POA: Diagnosis not present

## 2018-02-12 DIAGNOSIS — F330 Major depressive disorder, recurrent, mild: Secondary | ICD-10-CM | POA: Diagnosis not present

## 2018-02-12 NOTE — Progress Notes (Signed)
[facility] MD OP Progress Note  02/12/2018 4:15 PM Alisse Tuite  MRN:  [id number]  Chief Complaint: ' I am here for follow up.' Chief Complaint    Follow-up     HPI: Shari [name] is a 23 year old African-American female, employed, Ship broker, lives in [place], who has a history of depression, presented to the clinic today for a follow-up visit.  Patient today reports she recently had surgery.  Reports she is recovering okay.  I have reviewed medical records in [facility] per Dr.Vaught -dated 02/09/2018- " Pt had Myringotomy with tube placement.  Patient on prednisone taper. '.  She also had adenoidectomy.  Patient reports she was also diagnosed with Hidradenitis Suppurativa.  She was started on rifampin and clindamycin.  She does have some side effects to these medications including diarrhea.  Patient reports because of all her health problems she has been feeling down and anxious.  Patient has been compliant with her Zoloft as prescribed.  She denies any side effects.  Patient however reports she is able to cope with her mood symptoms so far.  She denies any suicidality.  She denies any perceptual disturbances.  Patient reports sleep is fair.  She has trazodone available.  Patient is at school at [employer] and reports school is going well.  She also works at [employer] and reports she enjoys her work she has social support from her mother and her grandmother.  She also has a boyfriend who is supportive.  Visit Diagnosis:    ICD-10-CM   1. MDD (major depressive disorder), recurrent episode, mild ([place]) F33.0   2. GAD (generalized anxiety disorder) F41.1     Past Psychiatric History: Used to follow-up with Dr. Einar Grad here in clinic.  Prior to that she used to be followed by a psychiatrist in [place].No hx suicide attempts. Was admitted at [facility] - 2017 - for mental health.  Past Medical History:  Past Medical History:  Diagnosis Date  . Anxiety   . Asthma    well controlled  . Diabetes mellitus without  complication ([place])    pt states she went off her meds because her numbers were fine  . Hidradenitis suppurativa 12/31 /2019  . Pneumonia 01/2017  . Refusal of blood product     Past Surgical History:  Procedure Laterality Date  . ADENOIDECTOMY Bilateral 02/09/2018   Procedure: REVISION ADENOIDECTOMY;  Surgeon: Carloyn Manner, MD;  Location: [facility] ORS;  Service: ENT;  Laterality: Bilateral;  . EYE SURGERY    . MYRINGOTOMY WITH TUBE PLACEMENT Right 02/09/2018   Procedure: MYRINGOTOMY WITH TUBE PLACEMENT;  Surgeon: Carloyn Manner, MD;  Location: [facility] ORS;  Service: ENT;  Laterality: Right;  . NASOPHARYNGOSCOPY EUSTATION TUBE BALLOON DILATION Bilateral 02/09/2018   Procedure: NASOPHARYNGOSCOPY EUSTATION TUBE BALLOON DILATION;  Surgeon: Carloyn Manner, MD;  Location: [facility] ORS;  Service: ENT;  Laterality: Bilateral;  . TONSILLECTOMY    . TURBINATE REDUCTION Right 02/09/2018   Procedure: OUTFRACTURE RIGHT INFERIOR TURBINATE;  Surgeon: Carloyn Manner, MD;  Location: [facility] ORS;  Service: ENT;  Laterality: Right;    Family Psychiatric History: As noted below.  Family History:  Family History  Problem Relation Age of Onset  . Anxiety disorder Mother   . Depression Mother   . Alcohol abuse Father   . Drug abuse Father   . Schizophrenia Maternal Uncle     Social History: She has a boyfriend .  She has an associate degree.  She lives with her mother and  Grandmother in [place]. Social History  Socioeconomic History  . Marital status: Single    Spouse name: Not on file  . Number of children: 0  . Years of education: Not on file  . Highest education level: Associate degree: occupational, Hotel manager, or vocational program  Occupational History  . Not on file  Social Needs  . Financial resource strain: Somewhat hard  . Food insecurity:    Worry: Never true    Inability: Never true  . Transportation needs:    Medical: No    Non-medical: No  Tobacco Use  . Smoking status:  Never Smoker  . Smokeless tobacco: Never Used  Substance and Sexual Activity  . Alcohol use: Yes    Comment: rare  . Drug use: No  . Sexual activity: Yes    Partners: Male    Birth control/protection: Implant, Condom  Lifestyle  . Physical activity:    Days per week: 0 days    Minutes per session: Not on file  . Stress: Rather much  Relationships  . Social connections:    Talks on phone: More than three times a week    Gets together: More than three times a week    Attends religious service: More than 4 times per year    Active member of club or organization: No    Attends meetings of clubs or organizations: Never    Relationship status: Never married  Other Topics Concern  . Not on file  Social History Narrative  . Not on file    Allergies:  Allergies  Allergen Reactions  . Tramadol Other (See Comments)    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    Metabolic Disorder Labs: Lab Results  Component Value Date   HGBA1C 6.0 (H) 12/07/2015   MPG 126 12/07/2015   Lab Results  Component Value Date   PROLACTIN 14.1 12/07/2015   Lab Results  Component Value Date   CHOL 321 (H) 12/07/2015   TRIG 71 12/07/2015   HDL 41 12/07/2015   CHOLHDL 7.8 12/07/2015   VLDL 14 12/07/2015   LDLCALC 266 (H) 12/07/2015   Lab Results  Component Value Date   TSH 1.265 12/07/2015    Therapeutic Level Labs: No results found for: LITHIUM No results found for: VALPROATE No components found for:  CBMZ  Current Medications: Current Outpatient Medications  Medication Sig Dispense Refill  . Benzocaine (BOIL-EASE EX) Apply 1 application topically daily as needed (boils).    . bismuth subsalicylate (PEPTO BISMOL) 262 MG/15ML suspension Take 30 mLs by mouth every 6 (six) hours as needed for indigestion or diarrhea or loose stools.    . ciprofloxacin-dexamethasone (CIPRODEX) OTIC suspension Place 4 drops into the right ear 2 (two) times daily for 4 days. 7.5 mL 0  . clindamycin (CLEOCIN) 300  MG capsule Take 300 mg by mouth 2 (two) times daily.    Marland Kitchen etonogestrel (NEXPLANON) 68 MG IMPL implant 68 mg by Subdermal route once.     Marland Kitchen HYDROcodone-acetaminophen (NORCO) 5-325 MG tablet Take 1 tablet by mouth every 4 (four) hours as needed for moderate pain. 20 tablet 0  . ibuprofen (ADVIL,MOTRIN) 800 MG tablet Take 800 mg by mouth every 8 (eight) hours as needed for moderate pain.    Marland Kitchen norethindrone (AYGESTIN) 5 MG tablet Take 2.5 mg by mouth 2 (two) times daily.     . ondansetron (ZOFRAN) 4 MG tablet Take 1 tablet (4 mg total) by mouth every 8 (eight) hours as needed for up to 10 doses for nausea or  vomiting. 20 tablet 0  . predniSONE (STERAPRED UNI-PAK 21 TAB) 10 MG (21) TBPK tablet Sterapred DS 6 day taper. 21 tablet 0  . PROAIR HFA 108 (90 Base) MCG/ACT inhaler Inhale 2 puffs into the lungs every 6 (six) hours as needed for wheezing or shortness of breath.     . rifampin (RIFADIN) 300 MG capsule Take 300 mg by mouth 2 (two) times daily.    . rosuvastatin (CRESTOR) 20 MG tablet Take 20 mg by mouth at bedtime.     . sertraline (ZOLOFT) 50 MG tablet Take 1 tablet (50 mg total) by mouth daily. (Patient taking differently: Take 50 mg by mouth every morning. ) 30 tablet 2  . traZODone (DESYREL) 50 MG tablet Take 1 tablet (50 mg total) by mouth at bedtime. (Patient taking differently: Take 50 mg by mouth at bedtime as needed for sleep. ) 30 tablet 2  . fluticasone (FLONASE) 50 MCG/ACT nasal spray      No current facility-administered medications for this visit.      Musculoskeletal: Strength & Muscle Tone: within normal limits Gait & Station: normal Patient leans: N/A  Psychiatric Specialty Exam: Review of Systems  Psychiatric/Behavioral: The patient is nervous/anxious.   All other systems reviewed and are negative.   Blood pressure 135/76, pulse 98, height 5' 7.5" (1.715 m), weight (!) 302 lb (137 kg).Body mass index is 46.6 kg/m.  General Appearance: Casual  Eye Contact:  Fair   Speech:  Clear and Coherent  Volume:  Normal  Mood:  Anxious  Affect:  Appropriate  Thought Process:  Goal Directed and Descriptions of Associations: Intact  Orientation:  Full (Time, Place, and Person)  Thought Content: Logical   Suicidal Thoughts:  No  Homicidal Thoughts:  No  Memory:  Immediate;   Fair Recent;   Fair Remote;   Fair  Judgement:  Fair  Insight:  Fair  Psychomotor Activity:  Normal  Concentration:  Concentration: Fair and Attention Span: Fair  Recall:  [employer] of Knowledge: Fair  Language: Fair  Akathisia:  No  Handed:  Right  AIMS (if indicated): denies rigidity, stiffness  Assets:  Communication Skills Desire for Improvement Social Support Talents/Skills  ADL's:  Intact  Cognition: WNL  Sleep:  Fair   Screenings: AUDIT     Admission (Discharged) from 12/06/2015 in [facility]  Alcohol Use Disorder Identification Test Final Score (AUDIT)  1       Assessment and Plan: Orella is a 24 year old African-American female who is employed, Ship broker at [employer], lives in [place], has a history of depression, recent surgery, presented to the clinic today for a follow-up visit.  Patient currently has psychosocial stressors of health problems which is making her anxious on and off.  She however has been coping. Discussed plan as noted below.  Plan MDD- improving Zoloft 50 mg p.o. daily Continue psychotherapy sessions as needed.  For GAD- improving Zoloft 50 mg p.o. daily   For insomnia-improving Trazodone 50 mg p.o. nightly  I have reviewed medical records in [facility]. Per Dr. Einar Grad most recent 1 dated 12/16/2017-' patient with depression, insomnia, obesity-was continued on Zoloft and trazodone at that visit.'  I have reviewed medical records in [facility] per Bland -summarized above.  Will order labs-TSH.  Follow-up in clinic in 3 months or sooner if needed.  I have spent atleast 15 minutes face to face with patient today. More  than 50 % of the time was spent for psychoeducation and  supportive psychotherapy and care coordination.  This note was generated in part or whole with voice recognition software. Voice recognition is usually quite accurate but there are transcription errors that can and very often do occur. I apologize for any typographical errors that were not detected and corrected.        Ursula Alert, MD 02/12/2018, 4:15 PM

## 2018-03-09 ENCOUNTER — Other Ambulatory Visit: Payer: Self-pay | Admitting: Otolaryngology

## 2018-03-09 ENCOUNTER — Other Ambulatory Visit (HOSPITAL_COMMUNITY): Payer: Self-pay | Admitting: Otolaryngology

## 2018-03-09 DIAGNOSIS — H9211 Otorrhea, right ear: Secondary | ICD-10-CM

## 2018-03-17 ENCOUNTER — Ambulatory Visit
Admission: RE | Admit: 2018-03-17 | Discharge: 2018-03-17 | Disposition: A | Discharge status: Home / Self Care | Payer: BLUE CROSS/BLUE SHIELD | Source: Ambulatory Visit | Attending: Otolaryngology | Admitting: Otolaryngology

## 2018-03-17 ENCOUNTER — Telehealth: Payer: Self-pay | Admitting: Psychiatry

## 2018-03-17 ENCOUNTER — Telehealth: Payer: Self-pay

## 2018-03-17 DIAGNOSIS — F320 Major depressive disorder, single episode, mild: Secondary | ICD-10-CM

## 2018-03-17 DIAGNOSIS — H9211 Otorrhea, right ear: Secondary | ICD-10-CM | POA: Diagnosis present

## 2018-03-17 MED ORDER — SERTRALINE HCL 50 MG PO TABS: 50.0000 mg | ORAL_TABLET | Freq: Every day | ORAL | 1 refills | Status: DC

## 2018-03-17 NOTE — Telephone Encounter (Signed)
Sent Zoloft to pharmacy 

## 2018-03-17 NOTE — Telephone Encounter (Signed)
  received a request for refill on zoloft.    sertraline (ZOLOFT) 50 MG tablet  Medication  Date: 11/18/2017 Department: [facility] Ordering/Authorizing: Elvin So, MD  Order Providers   Prescribing Provider Encounter Provider  Elvin So, MD Elvin So, MD  Outpatient Medication Detail    Disp Refills Start End   sertraline (ZOLOFT) 50 MG tablet 30 tablet 2 11/18/2017 11/18/2018   Sig - Route: Take 1 tablet (50 mg total) by mouth daily. - Oral   Patient taking differently: Take 50 mg by mouth every morning.        Class: Print

## 2018-05-14 ENCOUNTER — Other Ambulatory Visit: Payer: Self-pay

## 2018-05-14 ENCOUNTER — Ambulatory Visit (INDEPENDENT_AMBULATORY_CARE_PROVIDER_SITE_OTHER): Payer: BLUE CROSS/BLUE SHIELD | Admitting: Psychiatry

## 2018-05-14 ENCOUNTER — Encounter: Payer: Self-pay | Admitting: Psychiatry

## 2018-05-14 DIAGNOSIS — F5105 Insomnia due to other mental disorder: Secondary | ICD-10-CM | POA: Diagnosis not present

## 2018-05-14 DIAGNOSIS — F411 Generalized anxiety disorder: Secondary | ICD-10-CM | POA: Diagnosis not present

## 2018-05-14 DIAGNOSIS — F330 Major depressive disorder, recurrent, mild: Secondary | ICD-10-CM | POA: Diagnosis not present

## 2018-05-14 MED ORDER — SERTRALINE HCL 100 MG PO TABS: 100.0000 mg | ORAL_TABLET | Freq: Every day | ORAL | 1 refills | Status: DC

## 2018-05-14 NOTE — Progress Notes (Signed)
TC on  05-14-18 @ 1:10 pt medical and surgical hx was reviewed with no updates.  Pt allergies was reviewed with no changes. Pt medications and pharmacy were reviewed and updated. No vitals taken because this is a phone consult.

## 2018-05-14 NOTE — Progress Notes (Signed)
Virtual Visit via Telephone Note  I connected with Shari [name] on 05/14/18 at  2:00 PM EDT by telephone and verified that I am speaking with the correct person using two identifiers.   I discussed the limitations, risks, security and privacy concerns of performing an evaluation and management service by telephone and the availability of in person appointments. I also discussed with the patient that there may be a patient responsible charge related to this service. The patient expressed understanding and agreed to proceed.   I discussed the assessment and treatment plan with the patient. The patient was provided an opportunity to ask questions and all were answered. The patient agreed with the plan and demonstrated an understanding of the instructions.   The patient was advised to call back or seek an in-person evaluation if the symptoms worsen or if the condition fails to improve as anticipated.   [facility] MD OP Progress Note  05/14/2018 3:28 PM Shari [name]  MRN:  [id number]  Chief Complaint:  Chief Complaint    Follow-up     HPI: Shari [name] is a 23 year old African-American female,  unemployed recently, Ship broker, lives in [place], has a history of MDD, GAD, was evaluated by phone today.  Patient today reports that she currently lives with her boyfriend.  She reports since the COVID-19 outbreak she has noticed herself as worrying a lot.  She worries about everything to the extreme.  She worries about her mom's health.  She worries about the fact that her mom is getting older.  She reports she had her birthday recently however she did not enjoy the day much.  She reports that is because she kept worrying that she and her mother are getting older.  She reports she also has had some lack of energy, motivation, and some sadness.  She reports she also has noticed some sleep issues.  She reports she has been having vivid dreams or nightmares which affects her sleep.  She does have trazodone  available however she has not been taking it.  Patient reports she is compliant with her Zoloft.  She denies any side effects.  Patient denies any suicidality, homicidality or perceptual disturbances.  Patient reports good support system from her boyfriend with whom she lives now.  She continues to have support system from her mother.  She denies any other concerns today. Visit Diagnosis:    ICD-10-CM   1. MDD (major depressive disorder), recurrent episode, mild ([facility]) F33.0 sertraline (ZOLOFT) 100 MG tablet  2. GAD (generalized anxiety disorder) F41.1 sertraline (ZOLOFT) 100 MG tablet  3. Insomnia due to mental condition F51.05     Past Psychiatric History: I have reviewed past psychiatric history from my progress note on 02/12/2018.    Past Medical History:  Past Medical History:  Diagnosis Date  . Anxiety   . Asthma    well controlled  . Diabetes mellitus without complication ([place])    pt states she went off her meds because her numbers were fine  . Hidradenitis suppurativa 12/31 /2019  . Pneumonia 01/2017  . Refusal of blood product     Past Surgical History:  Procedure Laterality Date  . ADENOIDECTOMY Bilateral 02/09/2018   Procedure: REVISION ADENOIDECTOMY;  Surgeon: Carloyn Manner, MD;  Location: [facility] ORS;  Service: ENT;  Laterality: Bilateral;  . EYE SURGERY    . MYRINGOTOMY WITH TUBE PLACEMENT Right 02/09/2018   Procedure: MYRINGOTOMY WITH TUBE PLACEMENT;  Surgeon: Carloyn Manner, MD;  Location: [facility] ORS;  Service: ENT;  Laterality: Right;  .  NASOPHARYNGOSCOPY EUSTATION TUBE BALLOON DILATION Bilateral 02/09/2018   Procedure: NASOPHARYNGOSCOPY EUSTATION TUBE BALLOON DILATION;  Surgeon: Carloyn Manner, MD;  Location: [facility] ORS;  Service: ENT;  Laterality: Bilateral;  . TONSILLECTOMY    . TURBINATE REDUCTION Right 02/09/2018   Procedure: OUTFRACTURE RIGHT INFERIOR TURBINATE;  Surgeon: Carloyn Manner, MD;  Location: [facility] ORS;  Service: ENT;  Laterality: Right;     Family Psychiatric History: I have reviewed family psychiatric history from my progress note on 02/12/2018.  Family History:  Family History  Problem Relation Age of Onset  . Anxiety disorder Mother   . Depression Mother   . Alcohol abuse Father   . Drug abuse Father   . Schizophrenia Maternal Uncle     Social History: She currently lives with her boyfriend.  She does have good social support system from her mother.  I have reviewed social history from my progress note on 02/12/2018. Social History   Socioeconomic History  . Marital status: Single    Spouse name: Not on file  . Number of children: 0  . Years of education: Not on file  . Highest education level: Associate degree: occupational, Hotel manager, or vocational program  Occupational History  . Not on file  Social Needs  . Financial resource strain: Somewhat hard  . Food insecurity:    Worry: Never true    Inability: Never true  . Transportation needs:    Medical: No    Non-medical: No  Tobacco Use  . Smoking status: Never Smoker  . Smokeless tobacco: Never Used  Substance and Sexual Activity  . Alcohol use: Yes    Comment: rare  . Drug use: No  . Sexual activity: Yes    Partners: Male    Birth control/protection: Implant, Condom  Lifestyle  . Physical activity:    Days per week: 0 days    Minutes per session: Not on file  . Stress: Rather much  Relationships  . Social connections:    Talks on phone: More than three times a week    Gets together: More than three times a week    Attends religious service: More than 4 times per year    Active member of club or organization: No    Attends meetings of clubs or organizations: Never    Relationship status: Never married  Other Topics Concern  . Not on file  Social History Narrative  . Not on file    Allergies:  Allergies  Allergen Reactions  . Tramadol Other (See Comments)    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    Metabolic Disorder Labs: Lab  Results  Component Value Date   HGBA1C 6.0 (H) 12/07/2015   MPG 126 12/07/2015   Lab Results  Component Value Date   PROLACTIN 14.1 12/07/2015   Lab Results  Component Value Date   CHOL 321 (H) 12/07/2015   TRIG 71 12/07/2015   HDL 41 12/07/2015   CHOLHDL 7.8 12/07/2015   VLDL 14 12/07/2015   LDLCALC 266 (H) 12/07/2015   Lab Results  Component Value Date   TSH 1.265 12/07/2015    Therapeutic Level Labs: No results found for: LITHIUM No results found for: VALPROATE No components found for:  CBMZ  Current Medications: Current Outpatient Medications  Medication Sig Dispense Refill  . Benzocaine (BOIL-EASE EX) Apply 1 application topically daily as needed (boils).    . bismuth subsalicylate (PEPTO BISMOL) 262 MG/15ML suspension Take 30 mLs by mouth every 6 (six) hours as needed for indigestion  or diarrhea or loose stools.    Marland Kitchen etonogestrel (NEXPLANON) 68 MG IMPL implant 68 mg by Subdermal route once.     Marland Kitchen FLOVENT HFA 110 MCG/ACT inhaler INHALE 1 PUFF BY MOUTH TWICE DAILY FOR ASTHMA    . ibuprofen (ADVIL,MOTRIN) 800 MG tablet Take 800 mg by mouth every 8 (eight) hours as needed for moderate pain.    . metFORMIN (GLUCOPHAGE-XR) 500 MG 24 hr tablet TAKE 1 TABLET BY MOUTH TWICE DAILY FOR DIABETES.    . montelukast (SINGULAIR) 10 MG tablet TAKE 1 TABLET BY MOUTH ONCE DAILY FOR ASTHMA OR ALLERGIES.    Marland Kitchen norethindrone (AYGESTIN) 5 MG tablet Take 2.5 mg by mouth 2 (two) times daily.     . ondansetron (ZOFRAN) 4 MG tablet Take 1 tablet (4 mg total) by mouth every 8 (eight) hours as needed for up to 10 doses for nausea or vomiting. 20 tablet 0  . PROAIR HFA 108 (90 Base) MCG/ACT inhaler Inhale 2 puffs into the lungs every 6 (six) hours as needed for wheezing or shortness of breath.     . rosuvastatin (CRESTOR) 40 MG tablet TAKE 1 TABLET BY MOUTH ONCE DAILY FOR HIGH CHOLESTEROL    . traZODone (DESYREL) 50 MG tablet Take 1 tablet (50 mg total) by mouth at bedtime. (Patient taking  differently: Take 50 mg by mouth at bedtime as needed for sleep. ) 30 tablet 2  . sertraline (ZOLOFT) 100 MG tablet Take 1 tablet (100 mg total) by mouth daily. 30 tablet 1   No current facility-administered medications for this visit.      Musculoskeletal: Strength & Muscle Tone: UTA Gait & Station: UTA Patient leans: N/A  Psychiatric Specialty Exam: Review of Systems  Psychiatric/Behavioral: Positive for depression. The patient is nervous/anxious and has insomnia.   All other systems reviewed and are negative.   There were no vitals taken for this visit.There is no height or weight on file to calculate BMI.  General Appearance: UTA  Eye Contact:  UTA  Speech:  Clear and Coherent  Volume:  Normal  Mood:  Anxious and Depressed  Affect:  UTA  Thought Process:  Goal Directed and Descriptions of Associations: Intact  Orientation:  Full (Time, Place, and Person)  Thought Content: Logical   Suicidal Thoughts:  No  Homicidal Thoughts:  No  Memory:  Immediate;   Fair Recent;   Fair Remote;   Fair  Judgement:  Fair  Insight:  Fair  Psychomotor Activity:  UTA  Concentration:  Concentration: Fair and Attention Span: Fair  Recall:  [employer] of Knowledge: Fair  Language: Fair  Akathisia:  No  Handed:  Right  AIMS (if indicated): UTA  Assets:  Communication Skills Desire for Improvement Social Support  ADL's:  Intact  Cognition: WNL  Sleep:  Poor Has nightmares   Screenings: AUDIT     Admission (Discharged) from 12/06/2015 in [facility]  Alcohol Use Disorder Identification Test Final Score (AUDIT)  1       Assessment and Plan: Alexxis is a 23 year old African-American female who is employed, Ship broker at [employer], lives in [place], has a history of depression, anxiety, was evaluated by phone today.  Patient currently has psychosocial stressors of COVID-19 outbreak.  Patient reports worsening depression, anxiety as well as sleep issues.  We will  continue to make medication changes.  She does have good social support system.    Plan MDD-unstable Increase Zoloft to 100 mg p.o. daily Continue psychotherapy sessions  as needed.  For GAD-unstable Increase Zoloft to 100 mg p.o. daily  For insomnia-unstable Discussed with patient to start taking trazodone 50 mg p.o. nightly.  She has been noncompliant with the medication.  Discussed with her to increase the dosage to 75 or 100 mg as needed if she continues to have sleep problems.  Follow-up in clinic in 4 weeks or sooner if needed.  I have spent atleast 15 minutes non face to face with patient today. More than 50 % of the time was spent for psychoeducation and supportive psychotherapy and care coordination.  This note was generated in part or whole with voice recognition software. Voice recognition is usually quite accurate but there are transcription errors that can and very often do occur. I apologize for any typographical errors that were not detected and corrected.       Ursula Alert, MD 05/14/2018, 3:28 PM

## 2018-05-21 ENCOUNTER — Other Ambulatory Visit: Payer: Self-pay

## 2018-05-21 ENCOUNTER — Emergency Department
Admission: EM | Admit: 2018-05-21 | Discharge: 2018-05-21 | Disposition: A | Discharge status: Home / Self Care | Payer: BLUE CROSS/BLUE SHIELD | Attending: Emergency Medicine | Admitting: Emergency Medicine

## 2018-05-21 ENCOUNTER — Encounter: Payer: Self-pay | Admitting: Emergency Medicine

## 2018-05-21 ENCOUNTER — Emergency Department: Payer: BLUE CROSS/BLUE SHIELD

## 2018-05-21 DIAGNOSIS — Z7984 Long term (current) use of oral hypoglycemic drugs: Secondary | ICD-10-CM | POA: Insufficient documentation

## 2018-05-21 DIAGNOSIS — Z79899 Other long term (current) drug therapy: Secondary | ICD-10-CM | POA: Diagnosis not present

## 2018-05-21 DIAGNOSIS — R197 Diarrhea, unspecified: Secondary | ICD-10-CM | POA: Insufficient documentation

## 2018-05-21 DIAGNOSIS — R112 Nausea with vomiting, unspecified: Secondary | ICD-10-CM

## 2018-05-21 DIAGNOSIS — R1084 Generalized abdominal pain: Secondary | ICD-10-CM | POA: Diagnosis not present

## 2018-05-21 DIAGNOSIS — E119 Type 2 diabetes mellitus without complications: Secondary | ICD-10-CM | POA: Insufficient documentation

## 2018-05-21 DIAGNOSIS — J45909 Unspecified asthma, uncomplicated: Secondary | ICD-10-CM | POA: Diagnosis not present

## 2018-05-21 LAB — CBC WITH DIFFERENTIAL/PLATELET
Abs Immature Granulocytes: 0.04 10*3/uL (ref 0.00–0.07)
Basophils Absolute: 0 10*3/uL (ref 0.0–0.1)
Basophils Relative: 0 %
Eosinophils Absolute: 0.1 10*3/uL (ref 0.0–0.5)
Eosinophils Relative: 1 %
HCT: 41.5 % (ref 36.0–46.0)
Hemoglobin: 12.8 g/dL (ref 12.0–15.0)
Immature Granulocytes: 0 %
Lymphocytes Relative: 7 %
Lymphs Abs: 0.8 10*3/uL (ref 0.7–4.0)
MCH: 26.3 pg (ref 26.0–34.0)
MCHC: 30.8 g/dL (ref 30.0–36.0)
MCV: 85.2 fL (ref 80.0–100.0)
Monocytes Absolute: 0.5 10*3/uL (ref 0.1–1.0)
Monocytes Relative: 4 %
Neutro Abs: 10.4 10*3/uL — ABNORMAL HIGH (ref 1.7–7.7)
Neutrophils Relative %: 88 %
Platelets: 401 10*3/uL — ABNORMAL HIGH (ref 150–400)
RBC: 4.87 MIL/uL (ref 3.87–5.11)
RDW: 15.1 % (ref 11.5–15.5)
WBC: 11.8 10*3/uL — ABNORMAL HIGH (ref 4.0–10.5)
nRBC: 0 % (ref 0.0–0.2)

## 2018-05-21 LAB — COMPREHENSIVE METABOLIC PANEL
ALT: 14 U/L (ref 0–44)
AST: 12 U/L — ABNORMAL LOW (ref 15–41)
Albumin: 3.7 g/dL (ref 3.5–5.0)
Alkaline Phosphatase: 72 U/L (ref 38–126)
Anion gap: 8 (ref 5–15)
BUN: 12 mg/dL (ref 6–20)
CO2: 24 mmol/L (ref 22–32)
Calcium: 8.5 mg/dL — ABNORMAL LOW (ref 8.9–10.3)
Chloride: 106 mmol/L (ref 98–111)
Creatinine, Ser: 0.7 mg/dL (ref 0.44–1.00)
GFR calc Af Amer: >60 mL/min (ref >60)
GFR calc non Af Amer: >60 mL/min (ref >60)
Glucose, Bld: 219 mg/dL — ABNORMAL HIGH (ref 70–99)
Potassium: 4.3 mmol/L (ref 3.5–5.1)
Sodium: 138 mmol/L (ref 135–145)
Total Bilirubin: 0.5 mg/dL (ref 0.3–1.2)
Total Protein: 8.3 g/dL — ABNORMAL HIGH (ref 6.5–8.1)

## 2018-05-21 LAB — URINALYSIS, COMPLETE (UACMP) WITH MICROSCOPIC
Bilirubin Urine: NEGATIVE
Glucose, UA: NEGATIVE mg/dL
Hgb urine dipstick: NEGATIVE
Ketones, ur: 5 mg/dL — AB
Leukocytes,Ua: NEGATIVE
Nitrite: NEGATIVE
Protein, ur: 100 mg/dL — AB
Specific Gravity, Urine: 1.028 (ref 1.005–1.030)
pH: 6 (ref 5.0–8.0)

## 2018-05-21 LAB — POCT PREGNANCY, URINE: Preg Test, Ur: NEGATIVE

## 2018-05-21 LAB — LIPASE, BLOOD: Lipase: 24 U/L (ref 11–51)

## 2018-05-21 MED ORDER — SODIUM CHLORIDE 0.9 % IV BOLUS
1000.0000 mL | Freq: Once | INTRAVENOUS | Status: AC
  Administered 2018-05-21: 1000 mL via INTRAVENOUS

## 2018-05-21 MED ORDER — IOHEXOL 300 MG/ML  SOLN
125.0000 mL | Freq: Once | INTRAMUSCULAR | Status: AC | PRN
  Administered 2018-05-21: 125 mL via INTRAVENOUS

## 2018-05-21 MED ORDER — ONDANSETRON HCL 4 MG/2ML IJ SOLN
4.0000 mg | Freq: Once | INTRAMUSCULAR | Status: AC
  Administered 2018-05-21: 4 mg via INTRAVENOUS
  Filled 2018-05-21: qty 2

## 2018-05-21 MED ORDER — ONDANSETRON 4 MG PO TBDP: 4.0000 mg | ORAL_TABLET | Freq: Three times a day (TID) | ORAL | 0 refills | Status: DC | PRN

## 2018-05-21 MED ORDER — MORPHINE SULFATE (PF) 2 MG/ML IV SOLN
2.0000 mg | Freq: Once | INTRAVENOUS | Status: AC
  Administered 2018-05-21: 2 mg via INTRAVENOUS
  Filled 2018-05-21: qty 1

## 2018-05-21 NOTE — ED Notes (Signed)
Patient given warm blanket and requesting pain medication. MD made aware

## 2018-05-21 NOTE — ED Triage Notes (Addendum)
Patient ambulatory to triage with steady gait, without difficulty or distress noted; pt reports N/V/D since last night with generalized abd pain

## 2018-05-21 NOTE — ED Provider Notes (Signed)
[facility] Emergency Department Provider Note   First MD Initiated Contact with Patient 05/21/18 [phone]     (approximate)  I have reviewed the triage vital signs and the nursing notes.   HISTORY  Chief Complaint Emesis   HPI Shari [name] is a 23 y.o. female with medical history as listed below presents with acute onset of nausea, nonbloody vomiting and diarrhea which patient states began last night.  Patient also admits to generalized abdominal pain with current pain score of 10 out of 10.  Patient denies any fever afebrile on presentation.  Urinary symptoms.        Past Medical History:  Diagnosis Date  . Anxiety   . Asthma    well controlled  . Diabetes mellitus without complication ([place])    pt states she went off her meds because her numbers were fine  . Hidradenitis suppurativa 12/31 /2019  . Pneumonia 01/2017  . Refusal of blood product     Patient Active Problem List   Diagnosis Date Noted  . Fibroid uterus 06/10/2017  . Uterine mass 04/22/2017  . Pneumonia 01/28/2017  . Asthma 12/07/2015  . Diabetes mellitus without complication ([place]) 75/10/2583  . Obesity 12/06/2015  . Chronic pain 12/06/2015  . Schizophrenia spectrum disorder with psychotic disorder type not yet determined ([place]) 12/06/2015  . Pain in both feet 05/02/2015  . 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 Female pelvic pain 06/04/2012  . Pelvic and perineal pain 06/04/2012  . Incomplete emptying of bladder 12/13/2011  . Retention of urine 12/13/2011  . Symptoms involving urinary system 12/13/2011  . Urge incontinence 12/13/2011  . Increased frequency of urination 12/13/2011  . 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 Regular astigmatism 05/07/2010    Past Surgical History:  Procedure Laterality Date  . ADENOIDECTOMY Bilateral 02/09/2018   Procedure: REVISION ADENOIDECTOMY;  Surgeon: Carloyn Manner, MD;  Location: [facility] ORS;  Service: ENT;  Laterality: Bilateral;  . EYE SURGERY    .  MYRINGOTOMY WITH TUBE PLACEMENT Right 02/09/2018   Procedure: MYRINGOTOMY WITH TUBE PLACEMENT;  Surgeon: Carloyn Manner, MD;  Location: [facility] ORS;  Service: ENT;  Laterality: Right;  . NASOPHARYNGOSCOPY EUSTATION TUBE BALLOON DILATION Bilateral 02/09/2018   Procedure: NASOPHARYNGOSCOPY EUSTATION TUBE BALLOON DILATION;  Surgeon: Carloyn Manner, MD;  Location: [facility] ORS;  Service: ENT;  Laterality: Bilateral;  . TONSILLECTOMY    . TURBINATE REDUCTION Right 02/09/2018   Procedure: OUTFRACTURE RIGHT INFERIOR TURBINATE;  Surgeon: Carloyn Manner, MD;  Location: [facility] ORS;  Service: ENT;  Laterality: Right;    Prior to Admission medications   Medication Sig Start Date End Date Taking? Authorizing Provider  Benzocaine (BOIL-EASE EX) Apply 1 application topically daily as needed (boils).    [provider]  bismuth subsalicylate (PEPTO BISMOL) 262 MG/15ML suspension Take 30 mLs by mouth every 6 (six) hours as needed for indigestion or diarrhea or loose stools.    [provider]  etonogestrel (NEXPLANON) 68 MG IMPL implant 68 mg by Subdermal route once.     [provider]  FLOVENT HFA 110 MCG/ACT inhaler INHALE 1 PUFF BY MOUTH TWICE DAILY FOR ASTHMA 05/05/18   [provider]  ibuprofen (ADVIL,MOTRIN) 800 MG tablet Take 800 mg by mouth every 8 (eight) hours as needed for moderate pain.    [provider]  metFORMIN (GLUCOPHAGE-XR) 500 MG 24 hr tablet TAKE 1 TABLET BY MOUTH TWICE DAILY FOR DIABETES. 05/05/18   [provider]  montelukast (SINGULAIR) 10 MG tablet TAKE 1 TABLET BY MOUTH  ONCE DAILY FOR ASTHMA OR ALLERGIES. 05/05/18   [provider]  norethindrone (AYGESTIN) 5 MG tablet Take 2.5 mg by mouth 2 (two) times daily.  11/14/17   [provider]  ondansetron (ZOFRAN) 4 MG tablet Take 1 tablet (4 mg total) by mouth every 8 (eight) hours as needed for up to 10 doses for nausea or vomiting. 02/09/18   Carloyn Manner, MD  PROAIR  HFA 108 [phone] Base) MCG/ACT inhaler Inhale 2 puffs into the lungs every 6 (six) hours as needed for wheezing or shortness of breath.  12/09/17   [provider]  rosuvastatin (CRESTOR) 40 MG tablet TAKE 1 TABLET BY MOUTH ONCE DAILY FOR HIGH CHOLESTEROL 04/16/18   [provider]  sertraline (ZOLOFT) 100 MG tablet Take 1 tablet (100 mg total) by mouth daily. 05/14/18   Ursula Alert, MD  traZODone (DESYREL) 50 MG tablet Take 1 tablet (50 mg total) by mouth at bedtime. Patient taking differently: Take 50 mg by mouth at bedtime as needed for sleep.  11/18/17   Elvin So, MD    Allergies Tramadol  Family History  Problem Relation Age of Onset  . Anxiety disorder Mother   . Depression Mother   . Alcohol abuse Father   . Drug abuse Father   . Schizophrenia Maternal Uncle     Social History Social History   Tobacco Use  . Smoking status: Never Smoker  . Smokeless tobacco: Never Used  Substance Use Topics  . Alcohol use: Yes    Comment: rare  . Drug use: No    Review of Systems Constitutional: No fever/chills Eyes: No visual changes. ENT: No sore throat. Cardiovascular: Denies chest pain. Respiratory: Denies shortness of breath. Gastrointestinal: No abdominal pain.  No nausea, no vomiting.  No diarrhea.  No constipation. Genitourinary: Negative for dysuria. Musculoskeletal: Negative for neck pain.  Negative for back pain. Integumentary: Negative for rash. Neurological: Negative for headaches, focal weakness or numbness.   ____________________________________________   PHYSICAL EXAM:  VITAL SIGNS: ED Triage Vitals  Enc Vitals Group     BP 05/21/18 0536 (!) 147/86     Pulse Rate 05/21/18 0536 100     Resp 05/21/18 0536 18     Temp 05/21/18 0536 98.3 F (36.8 C)     Temp Source 05/21/18 0536 Oral     SpO2 05/21/18 0536 98 %     Weight 05/21/18 0532 (!) 137 kg (302 lb)     Height 05/21/18 0532 1.778 m (5\' 10" )     Head Circumference --      Peak  Flow --      Pain Score 05/21/18 0532 10     Pain Loc --      Pain Edu? --      Excl. in [place]? --     Constitutional: Alert and oriented. Well appearing and in no acute distress. Eyes: Conjunctivae are normal.  Head: Atraumatic. Ears:  Healthy appearing ear canals and TMs bilaterally Nose: No congestion/rhinnorhea. Mouth/Throat: Mucous membranes are moist.  Oropharynx non-erythematous. Neck: No stridor. Cardiovascular: Normal rate, regular rhythm. Good peripheral circulation. Grossly normal heart sounds. Respiratory: Normal respiratory effort.  No retractions. No audible wheezing. Gastrointestinal: Soft and nontender. No distention.  Musculoskeletal: No lower extremity tenderness nor edema. No gross deformities of extremities. Neurologic:  Normal speech and language. No gross focal neurologic deficits are appreciated.  Skin:  Skin is warm, dry and intact. No rash noted. Psychiatric: Mood and affect are normal. Speech and behavior  are normal.  ____________________________________________   LABS (all labs ordered are listed, but only abnormal results are displayed)  Labs Reviewed  CBC WITH DIFFERENTIAL/PLATELET - Abnormal; Notable for the following components:      Result Value   WBC 11.8 (*)    Platelets 401 (*)    Neutro Abs 10.4 (*)    All other components within normal limits  COMPREHENSIVE METABOLIC PANEL  LIPASE, BLOOD  URINALYSIS, COMPLETE (UACMP) WITH MICROSCOPIC      Procedures   ____________________________________________   INITIAL IMPRESSION / MDM / ASSESSMENT AND PLAN / ED COURSE  As part of my medical decision making, I reviewed the following data within the electronic MEDICAL RECORD NUMBER   23 year old female presented with above-stated history and physical exam most consistent with Gastroenteritis given absence of reproducible pain on abdominal exam.  Patient given IV Zofran and normal saline.  Patient however started admit to abdominal pain and as such 2 mg  of IV morphine was given.  At this time decision was made to perform a CT scan of the abdomen pelvis to evaluate for other potential intra-abdominal pathology.  Patient's care transferred to Dr. Burnard Hawthorne Royle was evaluated in Emergency Department on 05/21/2018 for the symptoms described in the history of present illness. She was evaluated in the context of the global COVID-19 pandemic, which necessitated consideration that the patient might be at risk for infection with the SARS-CoV-2 virus that causes COVID-19. Institutional protocols and algorithms that pertain to the evaluation of patients at risk for COVID-19 are in a state of rapid change based on information released by regulatory bodies including the CDC and federal and state organizations. These policies and algorithms were followed during the patient's care in the ED.       ____________________________________________  FINAL CLINICAL IMPRESSION(S) / ED DIAGNOSES  Final diagnoses:  Nausea vomiting and diarrhea     MEDICATIONS GIVEN DURING THIS VISIT:  Medications  ondansetron (ZOFRAN) injection 4 mg (4 mg Intravenous Given 05/21/18 0610)  sodium chloride 0.9 % bolus 1,000 mL (1,000 mLs Intravenous New Bag/Given 05/21/18 0610)     ED Discharge Orders    None       Note:  This document was prepared using Dragon voice recognition software and may include unintentional dictation errors.   Gregor Hams, MD 05/22/18 [phone]

## 2018-05-21 NOTE — ED Notes (Signed)
Pt up to bedside toilet with a steady gait. No distress noted.

## 2018-06-18 ENCOUNTER — Encounter: Payer: Self-pay | Admitting: Psychiatry

## 2018-06-18 ENCOUNTER — Other Ambulatory Visit: Payer: Self-pay

## 2018-06-18 ENCOUNTER — Ambulatory Visit (INDEPENDENT_AMBULATORY_CARE_PROVIDER_SITE_OTHER): Payer: BLUE CROSS/BLUE SHIELD | Admitting: Psychiatry

## 2018-06-18 DIAGNOSIS — F5105 Insomnia due to other mental disorder: Secondary | ICD-10-CM

## 2018-06-18 DIAGNOSIS — F411 Generalized anxiety disorder: Secondary | ICD-10-CM | POA: Diagnosis not present

## 2018-06-18 DIAGNOSIS — F330 Major depressive disorder, recurrent, mild: Secondary | ICD-10-CM

## 2018-06-18 MED ORDER — SERTRALINE HCL 100 MG PO TABS: 100.0000 mg | ORAL_TABLET | Freq: Every day | ORAL | 2 refills | Status: DC

## 2018-06-18 MED ORDER — TRAZODONE HCL 50 MG PO TABS: 50.0000 mg | ORAL_TABLET | Freq: Every day | ORAL | 2 refills | Status: DC

## 2018-06-18 NOTE — Progress Notes (Signed)
Virtual Visit via Video Note  I connected with Shari [name] on 06/18/18 at 10:30 AM EDT by a video enabled telemedicine application and verified that I am speaking with the correct person using two identifiers.   I discussed the limitations of evaluation and management by telemedicine and the availability of in person appointments. The patient expressed understanding and agreed to proceed.      I discussed the assessment and treatment plan with the patient. The patient was provided an opportunity to ask questions and all were answered. The patient agreed with the plan and demonstrated an understanding of the instructions.   The patient was advised to call back or seek an in-person evaluation if the symptoms worsen or if the condition fails to improve as anticipated.  BH MD/PA/NP OP Progress Note  06/18/2018 1:30 PM Shade Rivenbark  MRN:  [id number]  Chief Complaint:  Chief Complaint    Follow-up     HPI: Shari [name] is a 23 year old African-American female, unemployed recently, Ship broker, lives in [place], has a history of MDD, GAD was evaluated by telemedicine today.  Patient reports she is currently unemployed temporarily.  She however cannot get back to work soon.  She is currently getting unemployment benefits.  She continues to live with her boyfriend who is supportive.  She reports she has been managing her anxiety and depressive symptoms okay.  She is compliant on her medications as prescribed.  She denies any side effects.  She reports sleep is good.  She does report having possible visual hallucinations which happens only when she wakes up in the morning however it goes away within a few seconds and it does not bother her.  She has been seeing spiders on her wall and so on.  Discussed with patient to monitor her symptoms closely and if it worsens she will let writer know.  She denies any suicidality, homicidality or perceptual disturbances.  She denies any other concerns  today. visit Diagnosis:    ICD-10-CM   1. MDD (major depressive disorder), recurrent episode, mild (HCC) F33.0 sertraline (ZOLOFT) 100 MG tablet  2. GAD (generalized anxiety disorder) F41.1 sertraline (ZOLOFT) 100 MG tablet  3. Insomnia due to mental condition F51.05 traZODone (DESYREL) 50 MG tablet    Past Psychiatric History: I have reviewed past psychiatric history from my progress note on 02/12/2018.  Past Medical History:  Past Medical History:  Diagnosis Date  . Anxiety   . Asthma    well controlled  . Diabetes mellitus without complication ([place])    pt states she went off her meds because her numbers were fine  . Hidradenitis suppurativa 12/31 /2019  . Pneumonia 01/2017  . Refusal of blood product     Past Surgical History:  Procedure Laterality Date  . ADENOIDECTOMY Bilateral 02/09/2018   Procedure: REVISION ADENOIDECTOMY;  Surgeon: Carloyn Manner, MD;  Location: [facility] ORS;  Service: ENT;  Laterality: Bilateral;  . EYE SURGERY    . MYRINGOTOMY WITH TUBE PLACEMENT Right 02/09/2018   Procedure: MYRINGOTOMY WITH TUBE PLACEMENT;  Surgeon: Carloyn Manner, MD;  Location: [facility] ORS;  Service: ENT;  Laterality: Right;  . NASOPHARYNGOSCOPY EUSTATION TUBE BALLOON DILATION Bilateral 02/09/2018   Procedure: NASOPHARYNGOSCOPY EUSTATION TUBE BALLOON DILATION;  Surgeon: Carloyn Manner, MD;  Location: [facility] ORS;  Service: ENT;  Laterality: Bilateral;  . TONSILLECTOMY    . TURBINATE REDUCTION Right 02/09/2018   Procedure: OUTFRACTURE RIGHT INFERIOR TURBINATE;  Surgeon: Carloyn Manner, MD;  Location: [facility] ORS;  Service: ENT;  Laterality: Right;  Family Psychiatric History: Reviewed family psychiatric history from my progress note on 02/12/2018.  Family History:  Family History  Problem Relation Age of Onset  . Anxiety disorder Mother   . Depression Mother   . Alcohol abuse Father   . Drug abuse Father   . Schizophrenia Maternal Uncle     Social History: Reviewed social  history from my progress note on 02/12/2018. Social History   Socioeconomic History  . Marital status: Single    Spouse name: Not on file  . Number of children: 0  . Years of education: Not on file  . Highest education level: Associate degree: occupational, Hotel manager, or vocational program  Occupational History  . Not on file  Social Needs  . Financial resource strain: Somewhat hard  . Food insecurity:    Worry: Never true    Inability: Never true  . Transportation needs:    Medical: No    Non-medical: No  Tobacco Use  . Smoking status: Never Smoker  . Smokeless tobacco: Never Used  Substance and Sexual Activity  . Alcohol use: Yes    Comment: rare  . Drug use: No  . Sexual activity: Yes    Partners: Male    Birth control/protection: Implant, Condom  Lifestyle  . Physical activity:    Days per week: 0 days    Minutes per session: Not on file  . Stress: Rather much  Relationships  . Social connections:    Talks on phone: More than three times a week    Gets together: More than three times a week    Attends religious service: More than 4 times per year    Active member of club or organization: No    Attends meetings of clubs or organizations: Never    Relationship status: Never married  Other Topics Concern  . Not on file  Social History Narrative  . Not on file    Allergies:  Allergies  Allergen Reactions  . Tramadol Other (See Comments)    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    Metabolic Disorder Labs: Lab Results  Component Value Date   HGBA1C 6.0 (H) 12/07/2015   MPG 126 12/07/2015   Lab Results  Component Value Date   PROLACTIN 14.1 12/07/2015   Lab Results  Component Value Date   CHOL 321 (H) 12/07/2015   TRIG 71 12/07/2015   HDL 41 12/07/2015   CHOLHDL 7.8 12/07/2015   VLDL 14 12/07/2015   LDLCALC 266 (H) 12/07/2015   Lab Results  Component Value Date   TSH 1.265 12/07/2015    Therapeutic Level Labs: No results found for: LITHIUM No  results found for: VALPROATE No components found for:  CBMZ  Current Medications: Current Outpatient Medications  Medication Sig Dispense Refill  . amoxicillin-clavulanate (AUGMENTIN) 875-125 MG tablet TAKE 1 TABLET BY MOUTH TWICE DAILY FOR 14 DAYS    . Benzocaine (BOIL-EASE EX) Apply 1 application topically daily as needed (boils).    . bismuth subsalicylate (PEPTO BISMOL) 262 MG/15ML suspension Take 30 mLs by mouth every 6 (six) hours as needed for indigestion or diarrhea or loose stools.    Marland Kitchen etonogestrel (NEXPLANON) 68 MG IMPL implant 68 mg by Subdermal route once.     . fluconazole (DIFLUCAN) 150 MG tablet fluconazole 150 mg tablet    . ibuprofen (ADVIL,MOTRIN) 800 MG tablet Take 800 mg by mouth every 8 (eight) hours as needed for moderate pain.    . metFORMIN (GLUCOPHAGE-XR) 500 MG 24 hr tablet  TAKE 1 TABLET BY MOUTH TWICE DAILY FOR DIABETES.    . montelukast (SINGULAIR) 10 MG tablet TAKE 1 TABLET BY MOUTH ONCE DAILY FOR ASTHMA OR ALLERGIES.    Marland Kitchen norethindrone (AYGESTIN) 5 MG tablet Take 2.5 mg by mouth 2 (two) times daily.     . ondansetron (ZOFRAN ODT) 4 MG disintegrating tablet Take 1 tablet (4 mg total) by mouth every 8 (eight) hours as needed. 20 tablet 0  . ondansetron (ZOFRAN) 4 MG tablet Take 1 tablet (4 mg total) by mouth every 8 (eight) hours as needed for up to 10 doses for nausea or vomiting. 20 tablet 0  . PROAIR HFA 108 (90 Base) MCG/ACT inhaler Inhale 2 puffs into the lungs every 6 (six) hours as needed for wheezing or shortness of breath.     . rosuvastatin (CRESTOR) 40 MG tablet TAKE 1 TABLET BY MOUTH ONCE DAILY FOR HIGH CHOLESTEROL    . sertraline (ZOLOFT) 100 MG tablet Take 1 tablet (100 mg total) by mouth daily. 30 tablet 2  . traZODone (DESYREL) 50 MG tablet Take 1 tablet (50 mg total) by mouth at bedtime. 30 tablet 2   No current facility-administered medications for this visit.      Musculoskeletal: Strength & Muscle Tone: within normal limits Gait & Station:  normal Patient leans: N/A  Psychiatric Specialty Exam: Review of Systems  Psychiatric/Behavioral: The patient is nervous/anxious.   All other systems reviewed and are negative.   There were no vitals taken for this visit.There is no height or weight on file to calculate BMI.  General Appearance: Casual  Eye Contact:  Fair  Speech:  Clear and Coherent  Volume:  Normal  Mood:  Anxious  Affect:  Congruent  Thought Process:  Goal Directed and Descriptions of Associations: Intact  Orientation:  Full (Time, Place, and Person)  Thought Content: Logical   Suicidal Thoughts:  No  Homicidal Thoughts:  No  Memory:  Immediate;   Fair Recent;   Fair Remote;   Fair  Judgement:  Fair  Insight:  Fair  Psychomotor Activity:  Normal  Concentration:  Concentration: Fair and Attention Span: Fair  Recall:  [employer] of Knowledge: Fair  Language: Fair  Akathisia:  No  Handed:  Right  AIMS (if indicated): denies tremors, rigidity  Assets:  Agricultural consultant Social Support  ADL's:  Intact  Cognition: WNL  Sleep:  Fair   Screenings: AUDIT     Admission (Discharged) from 12/06/2015 in [facility]  Alcohol Use Disorder Identification Test Final Score (AUDIT)  1       Assessment and Plan: Aleen is a 23 year old African-American 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  Patient with psychosocial stressors of COVID-19 outbreak and recent job loss.  Patient however has been coping okay.  Plan as noted below.  Plan MDD-improving Zoloft 100 mg p.o. daily Continue psychotherapy sessions as needed.  For GAD-improving Zoloft 100 mg p.o. daily  For insomnia-improving Trazodone 50 mg p.o. nightly  Follow-up in clinic in 2 months or sooner if needed.  Appointment scheduled for July 10 at 10 AM.  I have spent atleast 15 minutes non  face to face with  patient today. More than 50 % of the time was spent for psychoeducation and supportive psychotherapy and care coordination.  This note was generated in part or whole with voice recognition software. Voice recognition is usually quite accurate  but there are transcription errors that can and very often do occur. I apologize for any typographical errors that were not detected and corrected.         Ursula Alert, MD 06/18/2018, 1:30 PM

## 2018-07-08 ENCOUNTER — Ambulatory Visit (INDEPENDENT_AMBULATORY_CARE_PROVIDER_SITE_OTHER): Payer: BC Managed Care – PPO | Admitting: Psychiatry

## 2018-07-08 ENCOUNTER — Other Ambulatory Visit: Payer: Self-pay

## 2018-07-08 ENCOUNTER — Encounter: Payer: Self-pay | Admitting: Psychiatry

## 2018-07-08 DIAGNOSIS — F5105 Insomnia due to other mental disorder: Secondary | ICD-10-CM

## 2018-07-08 DIAGNOSIS — F411 Generalized anxiety disorder: Secondary | ICD-10-CM | POA: Diagnosis not present

## 2018-07-08 DIAGNOSIS — F333 Major depressive disorder, recurrent, severe with psychotic symptoms: Secondary | ICD-10-CM | POA: Diagnosis not present

## 2018-07-08 MED ORDER — ARIPIPRAZOLE 2 MG PO TABS: 2.0000 mg | ORAL_TABLET | Freq: Every day | ORAL | 1 refills | Status: DC

## 2018-07-08 MED ORDER — TRAZODONE HCL 100 MG PO TABS: 100.0000 mg | ORAL_TABLET | Freq: Every evening | ORAL | 1 refills | Status: DC | PRN

## 2018-07-08 NOTE — Progress Notes (Signed)
Virtual Visit via Video Note  I connected with Shari [name] on 07/08/18 at  2:00 PM EDT by a video enabled telemedicine application and verified that I am speaking with the correct person using two identifiers.   I discussed the limitations of evaluation and management by telemedicine and the availability of in person appointments. The patient expressed understanding and agreed to proceed.    I discussed the assessment and treatment plan with the patient. The patient was provided an opportunity to ask questions and all were answered. The patient agreed with the plan and demonstrated an understanding of the instructions.   The patient was advised to call back or seek an in-person evaluation if the symptoms worsen or if the condition fails to improve as anticipated.   [facility] MD OP Progress Note  07/08/2018 3:59 PM Kinzleigh Kandler  MRN:  [id number]  Chief Complaint:  Chief Complaint    Follow-up     HPI: Shari [name] is a 23 year old African-American female, unemployed, Ship broker, lives in [place], has a history of MDD, GAD, insomnia, was evaluated by telemedicine today.  Patient today reports she has noticed her mood symptoms as worsening.  She describes sadness, lack of motivation, low energy, fatigue and sleep problems.  She also reports she has been worrying a lot since the past several days.  She reports she cannot understand why she has all these change in her mood.  She does not think any of her medications are effective.  She also reports sleep as restless.  She does not think the trazodone is helpful.  Patient reports she has been seeing writings in red and orange on her wall as well as seeing spiders on her wall.  This happens only when she is about to go to sleep and if she opens her eyes when she is going to sleep.  It does not happen all the time.  However it has been getting worse.  Patient reports she continues to live with her boyfriend.  Her mother is supportive.  Patient denies  any suicidality homicidality or perceptual disturbances.   Visit Diagnosis:    ICD-10-CM   1. MDD (major depressive disorder), recurrent, severe, with psychosis ([place]) F33.3 ARIPiprazole (ABILIFY) 2 MG tablet  2. GAD (generalized anxiety disorder) F41.1   3. Insomnia due to mental condition F51.05 traZODone (DESYREL) 100 MG tablet    Past Psychiatric History: Reviewed past psychiatric history from my progress note on 02/12/2018.  Past Medical History:  Past Medical History:  Diagnosis Date  . Anxiety   . Asthma    well controlled  . Diabetes mellitus without complication ([place])    pt states she went off her meds because her numbers were fine  . Hidradenitis suppurativa 12/31 /2019  . Pneumonia 01/2017  . Refusal of blood product     Past Surgical History:  Procedure Laterality Date  . ADENOIDECTOMY Bilateral 02/09/2018   Procedure: REVISION ADENOIDECTOMY;  Surgeon: Carloyn Manner, MD;  Location: [facility] ORS;  Service: ENT;  Laterality: Bilateral;  . EYE SURGERY    . MYRINGOTOMY WITH TUBE PLACEMENT Right 02/09/2018   Procedure: MYRINGOTOMY WITH TUBE PLACEMENT;  Surgeon: Carloyn Manner, MD;  Location: [facility] OR[facility];  Service: ENT;  Laterality: Right;  . NASOPHARYNGOSCOPY EUSTATION TUBE BALLOON DILATION Bilateral 02/09/2018   Procedure: NASOPHARYNGOSCOPY EUSTATION TUBE BALLOON DILATION;  Surgeon: Carloyn Manner, MD;  Location: [facility];  Service: ENT;  Laterality: Bilateral;  . TONSILLECTOMY    . TURBINATE REDUCTION Right 02/09/2018   Procedure: OUTFRACTURE RIGHT INFERIOR TURBINATE;  Surgeon: Carloyn Manner, MD;  Location: [facility] OR[facility];  Service: ENT;  Laterality: Right;    Family Psychiatric History: Reviewed family psychiatric history from my progress note on 02/12/2018.  Family History:  Family History  Problem Relation Age of Onset  . Anxiety disorder Mother   . Depression Mother   . Alcohol abuse Father   . Drug abuse Father   . Schizophrenia Maternal Uncle     Social  History: Reviewed social history from my progress note on 02/12/2018. Social History   Socioeconomic History  . Marital status: Single    Spouse name: Not on file  . Number of children: 0  . Years of education: Not on file  . Highest education level: Associate degree: occupational, Hotel manager, or vocational program  Occupational History  . Not on file  Social Needs  . Financial resource strain: Somewhat hard  . Food insecurity:    Worry: Never true    Inability: Never true  . Transportation needs:    Medical: No    Non-medical: No  Tobacco Use  . Smoking status: Never Smoker  . Smokeless tobacco: Never Used  Substance and Sexual Activity  . Alcohol use: Yes    Comment: rare  . Drug use: No  . Sexual activity: Yes    Partners: Male    Birth control/protection: Implant, Condom  Lifestyle  . Physical activity:    Days per week: 0 days    Minutes per session: Not on file  . Stress: Rather much  Relationships  . Social connections:    Talks on phone: More than three times a week    Gets together: More than three times a week    Attends religious service: More than 4 times per year    Active member of club or organization: No    Attends meetings of clubs or organizations: Never    Relationship status: Never married  Other Topics Concern  . Not on file  Social History Narrative  . Not on file    Allergies:  Allergies  Allergen Reactions  . Tramadol Other (See Comments)    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    Metabolic Disorder Labs: Lab Results  Component Value Date   HGBA1C 6.0 (H) 12/07/2015   MPG 126 12/07/2015   Lab Results  Component Value Date   PROLACTIN 14.1 12/07/2015   Lab Results  Component Value Date   CHOL 321 (H) 12/07/2015   TRIG 71 12/07/2015   HDL 41 12/07/2015   CHOLHDL 7.8 12/07/2015   VLDL 14 12/07/2015   LDLCALC 266 (H) 12/07/2015   Lab Results  Component Value Date   TSH 1.265 12/07/2015    Therapeutic Level Labs: No  results found for: LITHIUM No results found for: VALPROATE No components found for:  CBMZ  Current Medications: Current Outpatient Medications  Medication Sig Dispense Refill  . ARIPiprazole (ABILIFY) 2 MG tablet Take 1 tablet (2 mg total) by mouth daily. 30 tablet 1  . Benzocaine (BOIL-EASE EX) Apply 1 application topically daily as needed (boils).    . bismuth subsalicylate (PEPTO BISMOL) 262 MG/15ML suspension Take 30 mLs by mouth every 6 (six) hours as needed for indigestion or diarrhea or loose stools.    Marland Kitchen etonogestrel (NEXPLANON) 68 MG IMPL implant 68 mg by Subdermal route once.     . fluconazole (DIFLUCAN) 150 MG tablet fluconazole 150 mg tablet    . Fluticasone-Salmeterol (ADVAIR) 250-50 MCG/DOSE AEPB fluticasone 250 mcg-salmeterol 50 mcg/dose blistr powdr for  inhalation    . ibuprofen (ADVIL,MOTRIN) 800 MG tablet Take 800 mg by mouth every 8 (eight) hours as needed for moderate pain.    . metFORMIN (GLUCOPHAGE-XR) 500 MG 24 hr tablet TAKE 1 TABLET BY MOUTH TWICE DAILY FOR DIABETES.    . montelukast (SINGULAIR) 10 MG tablet TAKE 1 TABLET BY MOUTH ONCE DAILY FOR ASTHMA OR ALLERGIES.    Marland Kitchen norethindrone (AYGESTIN) 5 MG tablet Take 2.5 mg by mouth 2 (two) times daily.     Marland Kitchen omeprazole (PRILOSEC) 20 MG capsule TAKE 1 CAPSULE BY MOUTH ONCE DAILY FOR REFLUX    . ondansetron (ZOFRAN ODT) 4 MG disintegrating tablet Take 1 tablet (4 mg total) by mouth every 8 (eight) hours as needed. 20 tablet 0  . ondansetron (ZOFRAN) 4 MG tablet Take 1 tablet (4 mg total) by mouth every 8 (eight) hours as needed for up to 10 doses for nausea or vomiting. 20 tablet 0  . ondansetron (ZOFRAN) 8 MG tablet Take 8 mg by mouth every 8 (eight) hours as needed. for nausea    . PROAIR HFA 108 (90 Base) MCG/ACT inhaler Inhale 2 puffs into the lungs every 6 (six) hours as needed for wheezing or shortness of breath.     . rosuvastatin (CRESTOR) 40 MG tablet TAKE 1 TABLET BY MOUTH ONCE DAILY FOR HIGH CHOLESTEROL    .  sertraline (ZOLOFT) 100 MG tablet Take 1 tablet (100 mg total) by mouth daily. 30 tablet 2  . traZODone (DESYREL) 100 MG tablet Take 1-1.5 tablets (100-150 mg total) by mouth at bedtime as needed for sleep. 45 tablet 1   No current facility-administered medications for this visit.      Musculoskeletal: Strength & Muscle Tone: within normal limits Gait & Station: normal Patient leans: N/A  Psychiatric Specialty Exam: Review of Systems  Psychiatric/Behavioral: Positive for depression and hallucinations. The patient is nervous/anxious and has insomnia.   All other systems reviewed and are negative.   There were no vitals taken for this visit.There is no height or weight on file to calculate BMI.  General Appearance: Casual  Eye Contact:  Fair  Speech:  Clear and Coherent  Volume:  Normal  Mood:  Anxious and Depressed  Affect:  Congruent  Thought Process:  Goal Directed and Descriptions of Associations: Intact  Orientation:  Full (Time, Place, and Person)  Thought Content: Hallucinations: Visual   Suicidal Thoughts:  No  Homicidal Thoughts:  No  Memory:  Immediate;   Fair Recent;   Fair Remote;   Fair  Judgement:  Fair  Insight:  Fair  Psychomotor Activity:  Normal  Concentration:  Concentration: Fair and Attention Span: Fair  Recall:  [employer] of Knowledge: Fair  Language: Fair  Akathisia:  No  Handed:  Right  AIMS (if indicated): Denies tremors, rigidity, stiffness  Assets:  Communication Skills Desire for Improvement Social Support  ADL's:  Intact  Cognition: WNL  Sleep:  Poor   Screenings: AUDIT     Admission (Discharged) from 12/06/2015 in [facility]  Alcohol Use Disorder Identification Test Final Score (AUDIT)  1       Assessment and Plan: Shari [name] is a 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  Patient with psychosocial  stressors of current COVID-19 outbreak, recent job loss.  Patient reports she has noticed worsening mood symptoms and sleep problems.  She however has good social support system.  She denies any suicidality.  Plan MDD-worsening. Continue Zoloft 100 mg p.o. daily Add Abilify 2 mg p.o. daily Continue psychotherapy sessions.  For GAD-unstable Zoloft 100 mg p.o. daily.   For insomnia-restless Increase trazodone to 100 to 150 mg p.o. nightly.  Discussed with patient to monitor her symptoms closely.  If she has any worsening symptoms or suicidality advised her to go to the nearest emergency department.  Follow-up in clinic in 2 to 3 weeks or sooner if needed.  July 1 at 4:15 PM  I have spent atleast 15 minutes non face to face with patient today. More than 50 % of the time was spent for psychoeducation and supportive psychotherapy and care coordination.  This note was generated in part or whole with voice recognition software. Voice recognition is usually quite accurate but there are transcription errors that can and very often do occur. I apologize for any typographical errors that were not detected and corrected.        Ursula Alert, MD 07/08/2018, 3:59 PM

## 2018-07-29 ENCOUNTER — Other Ambulatory Visit: Payer: Self-pay

## 2018-07-29 ENCOUNTER — Ambulatory Visit: Payer: BC Managed Care – PPO | Admitting: Psychiatry

## 2018-08-07 ENCOUNTER — Ambulatory Visit: Payer: BLUE CROSS/BLUE SHIELD | Admitting: Psychiatry

## 2018-09-10 ENCOUNTER — Other Ambulatory Visit: Payer: Self-pay

## 2018-09-10 ENCOUNTER — Encounter: Payer: Self-pay | Admitting: Psychiatry

## 2018-09-10 ENCOUNTER — Ambulatory Visit (INDEPENDENT_AMBULATORY_CARE_PROVIDER_SITE_OTHER): Payer: BC Managed Care – PPO | Admitting: Psychiatry

## 2018-09-10 DIAGNOSIS — F333 Major depressive disorder, recurrent, severe with psychotic symptoms: Secondary | ICD-10-CM | POA: Diagnosis not present

## 2018-09-10 DIAGNOSIS — F5105 Insomnia due to other mental disorder: Secondary | ICD-10-CM | POA: Insufficient documentation

## 2018-09-10 DIAGNOSIS — F411 Generalized anxiety disorder: Secondary | ICD-10-CM | POA: Insufficient documentation

## 2018-09-10 MED ORDER — SERTRALINE HCL 100 MG PO TABS: 150.0000 mg | ORAL_TABLET | Freq: Every day | ORAL | 1 refills | Status: DC

## 2018-09-10 MED ORDER — TRAZODONE HCL 100 MG PO TABS: 100.0000 mg | ORAL_TABLET | Freq: Every evening | ORAL | 1 refills | Status: DC | PRN

## 2018-09-10 MED ORDER — ARIPIPRAZOLE 5 MG PO TABS: 5.0000 mg | ORAL_TABLET | Freq: Every day | ORAL | 1 refills | Status: DC

## 2018-09-10 NOTE — Progress Notes (Signed)
Virtual Visit via Video Note  I connected with Shari [name] on 09/10/18 at 10:15 AM EDT by a video enabled telemedicine application and verified that I am speaking with the correct person using two identifiers.   I discussed the limitations of evaluation and management by telemedicine and the availability of in person appointments. The patient expressed understanding and agreed to proceed.   I discussed the assessment and treatment plan with the patient. The patient was provided an opportunity to ask questions and all were answered. The patient agreed with the plan and demonstrated an understanding of the instructions.   The patient was advised to call back or seek an in-person evaluation if the symptoms worsen or if the condition fails to improve as anticipated.   [facility] MD OP Progress Note  09/10/2018 2:29 PM Shari [name]  MRN:  [id number]  Chief Complaint:  Chief Complaint    Follow-up     DVV:[id number] is a 23 year old African-American female, employed, lives in [place], has a history of MDD, GAD, insomnia was evaluated by telemedicine today.  Patient today reports she continues to work at [employer] today.  She reports the COVID-19 continues to stressful for her.  She has been struggling with depressive symptoms recently.  She reports she also has been seeing images when she wakes up in the morning.  She reports the Abilify does help to some extent.  She reports sleep is good on the trazodone.  She reports she could not keep her last appointment since she was at work.  Patient denies any suicidality, homicidality or perceptual disturbances.  Patient agrees to start psychotherapy sessions.  We will make the referral today.  Patient denies any other concerns today. Visit Diagnosis:    ICD-10-CM   1. MDD (major depressive disorder), recurrent, severe, with psychosis ([facility])  F33.3 ARIPiprazole (ABILIFY) 5 MG tablet  2. GAD (generalized anxiety disorder)  F41.1 sertraline  (ZOLOFT) 100 MG tablet  3. Insomnia due to mental condition  F51.05 traZODone (DESYREL) 100 MG tablet    Past Psychiatric History: I have reviewed past psychiatric history from my progress note on 02/12/2018.  Past Medical History:  Past Medical History:  Diagnosis Date  . Anxiety   . Asthma    well controlled  . Diabetes mellitus without complication ([place])    pt states she went off her meds because her numbers were fine  . Hidradenitis suppurativa 12/31 /2019  . Pneumonia 01/2017  . Refusal of blood product     Past Surgical History:  Procedure Laterality Date  . ADENOIDECTOMY Bilateral 02/09/2018   Procedure: REVISION ADENOIDECTOMY;  Surgeon: Carloyn Manner, MD;  Location: [facility];  Service: ENT;  Laterality: Bilateral;  . EYE SURGERY    . MYRINGOTOMY WITH TUBE PLACEMENT Right 02/09/2018   Procedure: MYRINGOTOMY WITH TUBE PLACEMENT;  Surgeon: Carloyn Manner, MD;  Location: [facility];  Service: ENT;  Laterality: Right;  . NASOPHARYNGOSCOPY EUSTATION TUBE BALLOON DILATION Bilateral 02/09/2018   Procedure: NASOPHARYNGOSCOPY EUSTATION TUBE BALLOON DILATION;  Surgeon: Carloyn Manner, MD;  Location: [facility];  Service: ENT;  Laterality: Bilateral;  . TONSILLECTOMY    . TURBINATE REDUCTION Right 02/09/2018   Procedure: OUTFRACTURE RIGHT INFERIOR TURBINATE;  Surgeon: Carloyn Manner, MD;  Location: [facility] OR[facility];  Service: ENT;  Laterality: Right;    Family Psychiatric History: I have reviewed family psychiatric history from my progress note on 02/12/2018.  Family History:  Family History  Problem Relation Age of Onset  . Anxiety disorder Mother   . Depression  Mother   . Alcohol abuse Father   . Drug abuse Father   . Schizophrenia Maternal Uncle     Social History: I have reviewed social history from my progress note on 02/12/2018. Social History   Socioeconomic History  . Marital status: Single    Spouse name: Not on file  . Number of children: 0  . Years of education:  Not on file  . Highest education level: Associate degree: occupational, Hotel manager, or vocational program  Occupational History  . Not on file  Social Needs  . Financial resource strain: Somewhat hard  . Food insecurity    Worry: Never true    Inability: Never true  . Transportation needs    Medical: No    Non-medical: No  Tobacco Use  . Smoking status: Never Smoker  . Smokeless tobacco: Never Used  Substance and Sexual Activity  . Alcohol use: Yes    Comment: rare  . Drug use: No  . Sexual activity: Yes    Partners: Male    Birth control/protection: Implant, Condom  Lifestyle  . Physical activity    Days per week: 0 days    Minutes per session: Not on file  . Stress: Rather much  Relationships  . Social connections    Talks on phone: More than three times a week    Gets together: More than three times a week    Attends religious service: More than 4 times per year    Active member of club or organization: No    Attends meetings of clubs or organizations: Never    Relationship status: Never married  Other Topics Concern  . Not on file  Social History Narrative  . Not on file    Allergies:  Allergies  Allergen Reactions  . Tramadol Other (See Comments)    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    Metabolic Disorder Labs: Lab Results  Component Value Date   HGBA1C 6.0 (H) 12/07/2015   MPG 126 12/07/2015   Lab Results  Component Value Date   PROLACTIN 14.1 12/07/2015   Lab Results  Component Value Date   CHOL 321 (H) 12/07/2015   TRIG 71 12/07/2015   HDL 41 12/07/2015   CHOLHDL 7.8 12/07/2015   VLDL 14 12/07/2015   LDLCALC 266 (H) 12/07/2015   Lab Results  Component Value Date   TSH 1.265 12/07/2015    Therapeutic Level Labs: No results found for: LITHIUM No results found for: VALPROATE No components found for:  CBMZ  Current Medications: Current Outpatient Medications  Medication Sig Dispense Refill  . ARIPiprazole (ABILIFY) 5 MG tablet Take 1  tablet (5 mg total) by mouth daily. 30 tablet 1  . Benzocaine (BOIL-EASE EX) Apply 1 application topically daily as needed (boils).    . bismuth subsalicylate (PEPTO BISMOL) 262 MG/15ML suspension Take 30 mLs by mouth every 6 (six) hours as needed for indigestion or diarrhea or loose stools.    . cephALEXin (KEFLEX) 500 MG capsule TAKE 1 CAPSULE BY MOUTH EVERY 8 HOURS FOR 7 DAYS    . etonogestrel (NEXPLANON) 68 MG IMPL implant 68 mg by Subdermal route once.     . fluconazole (DIFLUCAN) 150 MG tablet fluconazole 150 mg tablet    . Fluticasone-Salmeterol (ADVAIR) 250-50 MCG/DOSE AEPB fluticasone 250 mcg-salmeterol 50 mcg/dose blistr powdr for inhalation    . ibuprofen (ADVIL,MOTRIN) 800 MG tablet Take 800 mg by mouth every 8 (eight) hours as needed for moderate pain.    Marland Kitchen lactulose (  CHRONULAC) 10 GM/15ML solution     . letrozole (FEMARA) 2.5 MG tablet     . metFORMIN (GLUCOPHAGE-XR) 500 MG 24 hr tablet TAKE 1 TABLET BY MOUTH TWICE DAILY FOR DIABETES.    . montelukast (SINGULAIR) 10 MG tablet TAKE 1 TABLET BY MOUTH ONCE DAILY FOR ASTHMA OR ALLERGIES.    Marland Kitchen norethindrone (AYGESTIN) 5 MG tablet Take 2.5 mg by mouth 2 (two) times daily.     Marland Kitchen omeprazole (PRILOSEC) 20 MG capsule TAKE 1 CAPSULE BY MOUTH ONCE DAILY FOR REFLUX    . ondansetron (ZOFRAN ODT) 4 MG disintegrating tablet Take 1 tablet (4 mg total) by mouth every 8 (eight) hours as needed. 20 tablet 0  . ondansetron (ZOFRAN) 4 MG tablet Take 1 tablet (4 mg total) by mouth every 8 (eight) hours as needed for up to 10 doses for nausea or vomiting. 20 tablet 0  . ondansetron (ZOFRAN) 8 MG tablet Take 8 mg by mouth every 8 (eight) hours as needed. for nausea    . PROAIR HFA 108 (90 Base) MCG/ACT inhaler Inhale 2 puffs into the lungs every 6 (six) hours as needed for wheezing or shortness of breath.     . rosuvastatin (CRESTOR) 40 MG tablet TAKE 1 TABLET BY MOUTH ONCE DAILY FOR HIGH CHOLESTEROL    . sertraline (ZOLOFT) 100 MG tablet Take 1.5 tablets  (150 mg total) by mouth daily. 45 tablet 1  . sulfamethoxazole-trimethoprim (BACTRIM DS) 800-160 MG tablet     . TRADJENTA 5 MG TABS tablet     . traZODone (DESYREL) 100 MG tablet Take 1-1.5 tablets (100-150 mg total) by mouth at bedtime as needed for sleep. 45 tablet 1   No current facility-administered medications for this visit.      Musculoskeletal: Strength & Muscle Tone: UTA Gait & Station: normal Patient leans: N/A  Psychiatric Specialty Exam: Review of Systems  Psychiatric/Behavioral: Positive for depression and hallucinations.  All other systems reviewed and are negative.   There were no vitals taken for this visit.There is no height or weight on file to calculate BMI.  General Appearance: Casual  Eye Contact:  Fair  Speech:  Clear and Coherent  Volume:  Normal  Mood:  Depressed  Affect:  Congruent  Thought Process:  Goal Directed and Descriptions of Associations: Intact  Orientation:  Full (Time, Place, and Person)  Thought Content: Hallucinations: Visual   Suicidal Thoughts:  No  Homicidal Thoughts:  No  Memory:  Immediate;   Fair Recent;   Fair Remote;   Fair  Judgement:  Fair  Insight:  Fair  Psychomotor Activity:  Normal  Concentration:  Concentration: Fair and Attention Span: Fair  Recall:  [employer] of Knowledge: Fair  Language: Fair  Akathisia:  No  Handed:  Right  AIMS (if indicated):Denies tremors, rigidity  Assets:  Communication Skills Desire for Improvement Social Support  ADL's:  Intact  Cognition: WNL  Sleep:  Fair   Screenings: AUDIT     Admission (Discharged) from 12/06/2015 in [facility]  Alcohol Use Disorder Identification Test Final Score (AUDIT)  1       Assessment and Plan: Shari [name] is a 23 year old African-American female who is employed, lives in [place], has a history of depression, anxiety was evaluated by telemedicine today.  Patient continues to struggle with psychosocial stressors of the current  pandemic.  Patient continues to struggle with visual hallucinations as well as depression.  Patient will benefit from medication readjustment.  Plan For MDD-  unstable Increase Zoloft to 150 mg p.o. daily Increase Abilify to 5 mg p.o. daily Refer for psychotherapy session with therapist here in clinic  For GAD-some progress Zoloft as prescribed  For insomnia-improving Trazodone 100 to 150 mg p.o. nightly as needed  Patient has been referred for psychotherapy sessions here in clinic.  Follow-up in clinic in 4 weeks or sooner if needed.  September 16 at 4:45 PM  I have spent atleast 15 minutes non face to face with patient today. More than 50 % of the time was spent for psychoeducation and supportive psychotherapy and care coordination.  This note was generated in part or whole with voice recognition software. Voice recognition is usually quite accurate but there are transcription errors that can and very often do occur. I apologize for any typographical errors that were not detected and corrected.        Ursula Alert, MD 09/10/2018, 2:29 PM

## 2018-10-09 ENCOUNTER — Ambulatory Visit: Payer: BC Managed Care – PPO | Admitting: Licensed Clinical Social Worker

## 2018-10-14 ENCOUNTER — Ambulatory Visit (INDEPENDENT_AMBULATORY_CARE_PROVIDER_SITE_OTHER): Payer: BC Managed Care – PPO | Admitting: Psychiatry

## 2018-10-14 ENCOUNTER — Encounter: Payer: Self-pay | Admitting: Psychiatry

## 2018-10-14 ENCOUNTER — Other Ambulatory Visit: Payer: Self-pay

## 2018-10-14 DIAGNOSIS — F411 Generalized anxiety disorder: Secondary | ICD-10-CM | POA: Diagnosis not present

## 2018-10-14 DIAGNOSIS — F333 Major depressive disorder, recurrent, severe with psychotic symptoms: Secondary | ICD-10-CM

## 2018-10-14 DIAGNOSIS — F5105 Insomnia due to other mental disorder: Secondary | ICD-10-CM

## 2018-10-14 MED ORDER — SERTRALINE HCL 100 MG PO TABS: 150.0000 mg | ORAL_TABLET | Freq: Every day | ORAL | 1 refills | Status: DC

## 2018-10-14 MED ORDER — TRAZODONE HCL 100 MG PO TABS: 100.0000 mg | ORAL_TABLET | Freq: Every evening | ORAL | 1 refills | Status: DC | PRN

## 2018-10-14 MED ORDER — ARIPIPRAZOLE 5 MG PO TABS: 5.0000 mg | ORAL_TABLET | Freq: Every day | ORAL | 1 refills | Status: DC

## 2018-10-14 NOTE — Progress Notes (Signed)
Virtual Visit via Video Note  I connected with Shari [name] on 10/14/18 at  4:45 PM EDT by a video enabled telemedicine application and verified that I am speaking with the correct person using two identifiers.   I discussed the limitations of evaluation and management by telemedicine and the availability of in person appointments. The patient expressed understanding and agreed to proceed.   I discussed the assessment and treatment plan with the patient. The patient was provided an opportunity to ask questions and all were answered. The patient agreed with the plan and demonstrated an understanding of the instructions.   The patient was advised to call back or seek an in-person evaluation if the symptoms worsen or if the condition fails to improve as anticipated.   [facility] MD OP Progress Note  10/14/2018 5:00 PM Shari [name]  MRN:  [id number]  Chief Complaint:  Chief Complaint    Follow-up     HPI: Shari [name] is a 23 year old African-American female, employed, lives in [place], has a history of MDD, GAD, insomnia was evaluated by telemedicine today.  Patient today reports that she is currently doing okay.  The medications are working.  She denies any side effects.  She continues to see some images on the wall when she wakes up in the morning however that has been getting better.  Abilify does help.  She reports her dad passed away recently.  He had a cardiac arrest.  She reports she is currently grieving his loss and sometimes wish if she had more time to work on the relationship.  Discussed with patient to start more frequent psychotherapy sessions.  She will reach out to the therapist.  She denies any suicidality, homicidality or perceptual disturbances.  She reports sleep is good.  She reports she is currently working 2 jobs and work is going well.  She denies any other concerns today.   Visit Diagnosis:    ICD-10-CM   1. MDD (major depressive disorder), recurrent, severe,  with psychosis (HCC)  F33.3 ARIPiprazole (ABILIFY) 5 MG tablet  2. GAD (generalized anxiety disorder)  F41.1 sertraline (ZOLOFT) 100 MG tablet  3. Insomnia due to mental condition  F51.05 traZODone (DESYREL) 100 MG tablet    Past Psychiatric History: I have reviewed past psychiatric history from my progress note on 02/12/2018  Past Medical History:  Past Medical History:  Diagnosis Date  . Anxiety   . Asthma    well controlled  . Diabetes mellitus without complication ([place])    pt states she went off her meds because her numbers were fine  . Hidradenitis suppurativa 12/31 /2019  . Pneumonia 01/2017  . Refusal of blood product     Past Surgical History:  Procedure Laterality Date  . ADENOIDECTOMY Bilateral 02/09/2018   Procedure: REVISION ADENOIDECTOMY;  Surgeon: Carloyn Manner, MD;  Location: [facility] ORS;  Service: ENT;  Laterality: Bilateral;  . EYE SURGERY    . MYRINGOTOMY WITH TUBE PLACEMENT Right 02/09/2018   Procedure: MYRINGOTOMY WITH TUBE PLACEMENT;  Surgeon: Carloyn Manner, MD;  Location: [facility];  Service: ENT;  Laterality: Right;  . NASOPHARYNGOSCOPY EUSTATION TUBE BALLOON DILATION Bilateral 02/09/2018   Procedure: NASOPHARYNGOSCOPY EUSTATION TUBE BALLOON DILATION;  Surgeon: Carloyn Manner, MD;  Location: [facility] OR[facility];  Service: ENT;  Laterality: Bilateral;  . TONSILLECTOMY    . TURBINATE REDUCTION Right 02/09/2018   Procedure: OUTFRACTURE RIGHT INFERIOR TURBINATE;  Surgeon: Carloyn Manner, MD;  Location: [facility] OR[facility];  Service: ENT;  Laterality: Right;    Family Psychiatric History: I have  reviewed family psychiatric history from my progress note on 02/12/2018  Family History:  Family History  Problem Relation Age of Onset  . Anxiety disorder Mother   . Depression Mother   . Alcohol abuse Father   . Drug abuse Father   . Schizophrenia Maternal Uncle     Social History: I have reviewed social history from my progress note on 02/12/2018 Social History    Socioeconomic History  . Marital status: Single    Spouse name: Not on file  . Number of children: 0  . Years of education: Not on file  . Highest education level: Associate degree: occupational, Hotel manager, or vocational program  Occupational History  . Not on file  Social Needs  . Financial resource strain: Somewhat hard  . Food insecurity    Worry: Never true    Inability: Never true  . Transportation needs    Medical: No    Non-medical: No  Tobacco Use  . Smoking status: Never Smoker  . Smokeless tobacco: Never Used  Substance and Sexual Activity  . Alcohol use: Yes    Comment: rare  . Drug use: No  . Sexual activity: Yes    Partners: Male    Birth control/protection: Implant, Condom  Lifestyle  . Physical activity    Days per week: 0 days    Minutes per session: Not on file  . Stress: Rather much  Relationships  . Social connections    Talks on phone: More than three times a week    Gets together: More than three times a week    Attends religious service: More than 4 times per year    Active member of club or organization: No    Attends meetings of clubs or organizations: Never    Relationship status: Never married  Other Topics Concern  . Not on file  Social History Narrative  . Not on file    Allergies:  Allergies  Allergen Reactions  . Tramadol Other (See Comments)    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    Metabolic Disorder Labs: Lab Results  Component Value Date   HGBA1C 6.0 (H) 12/07/2015   MPG 126 12/07/2015   Lab Results  Component Value Date   PROLACTIN 14.1 12/07/2015   Lab Results  Component Value Date   CHOL 321 (H) 12/07/2015   TRIG 71 12/07/2015   HDL 41 12/07/2015   CHOLHDL 7.8 12/07/2015   VLDL 14 12/07/2015   LDLCALC 266 (H) 12/07/2015   Lab Results  Component Value Date   TSH 1.265 12/07/2015    Therapeutic Level Labs: No results found for: LITHIUM No results found for: VALPROATE No components found for:   CBMZ  Current Medications: Current Outpatient Medications  Medication Sig Dispense Refill  . amoxicillin (AMOXIL) 875 MG tablet TAKE 1 TABLET BY MOUTH TWICE DAILY FOR INFECTION FOR 10 DAYS    . ARIPiprazole (ABILIFY) 5 MG tablet Take 1 tablet (5 mg total) by mouth daily. 30 tablet 1  . Benzocaine (BOIL-EASE EX) Apply 1 application topically daily as needed (boils).    . bismuth subsalicylate (PEPTO BISMOL) 262 MG/15ML suspension Take 30 mLs by mouth every 6 (six) hours as needed for indigestion or diarrhea or loose stools.    . cephALEXin (KEFLEX) 500 MG capsule TAKE 1 CAPSULE BY MOUTH EVERY 8 HOURS FOR 7 DAYS    . etonogestrel (NEXPLANON) 68 MG IMPL implant 68 mg by Subdermal route once.     . fluconazole (DIFLUCAN) 150  MG tablet fluconazole 150 mg tablet    . Fluticasone-Salmeterol (ADVAIR) 250-50 MCG/DOSE AEPB fluticasone 250 mcg-salmeterol 50 mcg/dose blistr powdr for inhalation    . ibuprofen (ADVIL,MOTRIN) 800 MG tablet Take 800 mg by mouth every 8 (eight) hours as needed for moderate pain.    Marland Kitchen JARDIANCE 25 MG TABS tablet     . lactulose (CHRONULAC) 10 GM/15ML solution     . letrozole (FEMARA) 2.5 MG tablet     . metFORMIN (GLUCOPHAGE-XR) 500 MG 24 hr tablet TAKE 1 TABLET BY MOUTH TWICE DAILY FOR DIABETES.    . montelukast (SINGULAIR) 10 MG tablet TAKE 1 TABLET BY MOUTH ONCE DAILY FOR ASTHMA OR ALLERGIES.    Marland Kitchen norethindrone (AYGESTIN) 5 MG tablet Take 2.5 mg by mouth 2 (two) times daily.     Marland Kitchen omeprazole (PRILOSEC) 20 MG capsule TAKE 1 CAPSULE BY MOUTH ONCE DAILY FOR REFLUX    . ondansetron (ZOFRAN ODT) 4 MG disintegrating tablet Take 1 tablet (4 mg total) by mouth every 8 (eight) hours as needed. 20 tablet 0  . ondansetron (ZOFRAN) 4 MG tablet Take 1 tablet (4 mg total) by mouth every 8 (eight) hours as needed for up to 10 doses for nausea or vomiting. 20 tablet 0  . ondansetron (ZOFRAN) 8 MG tablet Take 8 mg by mouth every 8 (eight) hours as needed. for nausea    . PROAIR HFA 108 (90  Base) MCG/ACT inhaler Inhale 2 puffs into the lungs every 6 (six) hours as needed for wheezing or shortness of breath.     . rosuvastatin (CRESTOR) 40 MG tablet TAKE 1 TABLET BY MOUTH ONCE DAILY FOR HIGH CHOLESTEROL    . sertraline (ZOLOFT) 100 MG tablet Take 1.5 tablets (150 mg total) by mouth daily. 45 tablet 1  . sulfamethoxazole-trimethoprim (BACTRIM DS) 800-160 MG tablet     . TRADJENTA 5 MG TABS tablet     . traZODone (DESYREL) 100 MG tablet Take 1-1.5 tablets (100-150 mg total) by mouth at bedtime as needed for sleep. 45 tablet 1   No current facility-administered medications for this visit.      Musculoskeletal: Strength & Muscle Tone: UTA Gait & Station: normal Patient leans: N/A  Psychiatric Specialty Exam: Review of Systems  Psychiatric/Behavioral: Positive for depression.  All other systems reviewed and are negative.   There were no vitals taken for this visit.There is no height or weight on file to calculate BMI.  General Appearance: Casual  Eye Contact:  Fair  Speech:  Clear and Coherent  Volume:  Normal  Mood:  Depressed  Affect:  Congruent  Thought Process:  Goal Directed and Descriptions of Associations: Intact  Orientation:  Full (Time, Place, and Person)  Thought Content: Logical   Suicidal Thoughts:  No  Homicidal Thoughts:  No  Memory:  Immediate;   Fair Recent;   Fair Remote;   Fair  Judgement:  Fair  Insight:  Fair  Psychomotor Activity:  Normal  Concentration:  Concentration: Fair and Attention Span: Fair  Recall:  [employer] of Knowledge: Fair  Language: Fair  Akathisia:  No  Handed:  Right  AIMS (if indicated): Denies tremors, rigidity  Assets:  Communication Skills Desire for Improvement Social Support  ADL's:  Intact  Cognition: WNL  Sleep:  Fair   Screenings: AUDIT     Admission (Discharged) from 12/06/2015 in [facility]  Alcohol Use Disorder Identification Test Final Score (AUDIT)  1       Assessment and  Plan: Shontai is a 23 year old African-American female who is employed, lives in [place], has a history of depression, anxiety was evaluated by telemedicine today.  Patient continues to have psychosocial stressors of the current pandemic as well as is grieving the loss of her father who passed away recently.  Patient will continue to benefit from medication management as well as psychotherapy sessions.  Plan MDD- some progress Zoloft 150 mg p.o. daily Abilify 5 mg p.o. daily Patient referred for psychotherapy sessions  GAD-some progress Zoloft as prescribed  Insomnia-improving Trazodone 100-150 mg p.o. nightly as needed  Follow-up in clinic in 1 month or sooner if needed.  October 15 at 1130  I have spent atleast 15 minutes non face to face with patient today. More than 50 % of the time was spent for psychoeducation and supportive psychotherapy and care coordination. This note was generated in part or whole with voice recognition software. Voice recognition is usually quite accurate but there are transcription errors that can and very often do occur. I apologize for any typographical errors that were not detected and corrected.       Ursula Alert, MD 10/14/2018, 5:00 PM

## 2018-11-10 ENCOUNTER — Encounter: Payer: Self-pay | Admitting: Licensed Clinical Social Worker

## 2018-11-10 ENCOUNTER — Encounter

## 2018-11-10 ENCOUNTER — Other Ambulatory Visit: Payer: Self-pay

## 2018-11-10 ENCOUNTER — Ambulatory Visit (INDEPENDENT_AMBULATORY_CARE_PROVIDER_SITE_OTHER): Payer: BC Managed Care – PPO | Admitting: Licensed Clinical Social Worker

## 2018-11-10 DIAGNOSIS — F333 Major depressive disorder, recurrent, severe with psychotic symptoms: Secondary | ICD-10-CM | POA: Diagnosis not present
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
## 2018-11-12 ENCOUNTER — Other Ambulatory Visit: Payer: Self-pay

## 2018-11-12 ENCOUNTER — Ambulatory Visit (INDEPENDENT_AMBULATORY_CARE_PROVIDER_SITE_OTHER): Payer: BC Managed Care – PPO | Admitting: Psychiatry

## 2018-11-12 ENCOUNTER — Encounter: Payer: Self-pay | Admitting: Psychiatry

## 2018-11-12 DIAGNOSIS — Z5329 Procedure and treatment not carried out because of patient's decision for other reasons: Secondary | ICD-10-CM

## 2018-11-12 DIAGNOSIS — F5105 Insomnia due to other mental disorder: Secondary | ICD-10-CM

## 2018-11-12 DIAGNOSIS — F411 Generalized anxiety disorder: Secondary | ICD-10-CM

## 2018-11-12 DIAGNOSIS — F333 Major depressive disorder, recurrent, severe with psychotic symptoms: Secondary | ICD-10-CM | POA: Diagnosis not present

## 2018-11-12 NOTE — Progress Notes (Signed)
Virtual Visit via Telephone Note  I connected with Shari [name] on 11/12/18 at  2:45 PM EDT by telephone and verified that I am speaking with the correct person using two identifiers.   I discussed the limitations, risks, security and privacy concerns of performing an evaluation and management service by telephone and the availability of in person appointments. I also discussed with the patient that there may be a patient responsible charge related to this service. The patient expressed understanding and agreed to proceed.   I discussed the assessment and treatment plan with the patient. The patient was provided an opportunity to ask questions and all were answered. The patient agreed with the plan and demonstrated an understanding of the instructions.   The patient was advised to call back or seek an in-person evaluation if the symptoms worsen or if the condition fails to improve as anticipated.  [facility] MD OP Progress Note  11/12/2018 3:01 PM Shari [name]  MRN:  [id number]  Chief Complaint:  Chief Complaint    Follow-up     HPI: Shari [name] is a 23 year old African-American female, employed, lives in [place], has a history of MDD, GAD, insomnia was evaluated by telemedicine today.  Patient today missed her a.m. appointment and rescheduled it for the afternoon.  Patient preferred to do a phone call since she was having connection problems.  Patient reports she is currently doing well on the current medication regimen.  She is coping okay with the loss of her father.  She had her first appointment with her therapist and has an upcoming appointment scheduled.  She reports sleep is good.  She does report visual hallucinations of seeing spiders when she wakes up in the morning however it is not too overwhelming anymore.  She reports she is able to cope with it.  The Abilify does help.  She does not want a dosage increase today.  She denies any side effects to her medications.  Patient  denies any suicidality, homicidality or perceptual disturbances.  Patient denies any other concerns today.   Visit Diagnosis:    ICD-10-CM   1. MDD (major depressive disorder), recurrent, severe, with psychosis ([place])  F33.3   2. GAD (generalized anxiety disorder)  F41.1   3. Insomnia due to mental condition  F51.05     Past Psychiatric History: I have reviewed past psychiatric history for my progress note on 02/12/2018  Past Medical History:  Past Medical History:  Diagnosis Date  . Anxiety   . Asthma    well controlled  . Diabetes mellitus without complication ([place])    pt states she went off her meds because her numbers were fine  . Hidradenitis suppurativa 12/31 /2019  . Pneumonia 01/2017  . Refusal of blood product     Past Surgical History:  Procedure Laterality Date  . ADENOIDECTOMY Bilateral 02/09/2018   Procedure: REVISION ADENOIDECTOMY;  Surgeon: Carloyn Manner, MD;  Location: [facility] ORS;  Service: ENT;  Laterality: Bilateral;  . EYE SURGERY    . MYRINGOTOMY WITH TUBE PLACEMENT Right 02/09/2018   Procedure: MYRINGOTOMY WITH TUBE PLACEMENT;  Surgeon: Carloyn Manner, MD;  Location: [facility] ORS;  Service: ENT;  Laterality: Right;  . NASOPHARYNGOSCOPY EUSTATION TUBE BALLOON DILATION Bilateral 02/09/2018   Procedure: NASOPHARYNGOSCOPY EUSTATION TUBE BALLOON DILATION;  Surgeon: Carloyn Manner, MD;  Location: [facility] ORS;  Service: ENT;  Laterality: Bilateral;  . TONSILLECTOMY    . TURBINATE REDUCTION Right 02/09/2018   Procedure: OUTFRACTURE RIGHT INFERIOR TURBINATE;  Surgeon: Carloyn Manner, MD;  [facility]  ORS;  Service: ENT;  Laterality: Right;    Family Psychiatric History: I have reviewed family psychiatric history from my progress note on 02/12/2018  Family History:  Family History  Problem Relation Age of Onset  . Anxiety disorder Mother   . Depression Mother   . Alcohol abuse Father   . Drug abuse Father   . Schizophrenia Maternal Uncle     Social  History: I have reviewed social history from my progress note on 02/12/2018 Social History   Socioeconomic History  . Marital status: Single    Spouse name: Not on file  . Number of children: 0  . Years of education: Not on file  . Highest education level: Associate degree: occupational, Hotel manager, or vocational program  Occupational History  . Not on file  Social Needs  . Financial resource strain: Somewhat hard  . Food insecurity    Worry: Never true    Inability: Never true  . Transportation needs    Medical: No    Non-medical: No  Tobacco Use  . Smoking status: Never Smoker  . Smokeless tobacco: Never Used  Substance and Sexual Activity  . Alcohol use: Yes    Comment: rare  . Drug use: No  . Sexual activity: Yes    Partners: Male    Birth control/protection: Implant, Condom  Lifestyle  . Physical activity    Days per week: 0 days    Minutes per session: Not on file  . Stress: Rather much  Relationships  . Social connections    Talks on phone: More than three times a week    Gets together: More than three times a week    Attends religious service: More than 4 times per year    Active member of club or organization: No    Attends meetings of clubs or organizations: Never    Relationship status: Never married  Other Topics Concern  . Not on file  Social History Narrative  . Not on file    Allergies:  Allergies  Allergen Reactions  . Tramadol Other (See Comments)    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    Metabolic Disorder Labs: Lab Results  Component Value Date   HGBA1C 6.0 (H) 12/07/2015   MPG 126 12/07/2015   Lab Results  Component Value Date   PROLACTIN 14.1 12/07/2015   Lab Results  Component Value Date   CHOL 321 (H) 12/07/2015   TRIG 71 12/07/2015   HDL 41 12/07/2015   CHOLHDL 7.8 12/07/2015   VLDL 14 12/07/2015   LDLCALC 266 (H) 12/07/2015   Lab Results  Component Value Date   TSH 1.265 12/07/2015    Therapeutic Level Labs: No  results found for: LITHIUM No results found for: VALPROATE No components found for:  CBMZ  Current Medications: Current Outpatient Medications  Medication Sig Dispense Refill  . Benzocaine (BOIL-EASE EX) Apply 1 application topically daily as needed (boils).    . bismuth subsalicylate (PEPTO BISMOL) 262 MG/15ML suspension Take 30 mLs by mouth every 6 (six) hours as needed for indigestion or diarrhea or loose stools.    Marland Kitchen etonogestrel (NEXPLANON) 68 MG IMPL implant 68 mg by Subdermal route once.     . Fluticasone-Salmeterol (ADVAIR) 250-50 MCG/DOSE AEPB fluticasone 250 mcg-salmeterol 50 mcg/dose blistr powdr for inhalation    . ibuprofen (ADVIL,MOTRIN) 800 MG tablet Take 800 mg by mouth every 8 (eight) hours as needed for moderate pain.    Marland Kitchen JARDIANCE 25 MG TABS tablet     .  letrozole (FEMARA) 2.5 MG tablet     . montelukast (SINGULAIR) 10 MG tablet TAKE 1 TABLET BY MOUTH ONCE DAILY FOR ASTHMA OR ALLERGIES.    Marland Kitchen norethindrone (AYGESTIN) 5 MG tablet Take 2.5 mg by mouth 2 (two) times daily.     Marland Kitchen omeprazole (PRILOSEC) 20 MG capsule TAKE 1 CAPSULE BY MOUTH ONCE DAILY FOR REFLUX    . PROAIR HFA 108 (90 Base) MCG/ACT inhaler Inhale 2 puffs into the lungs every 6 (six) hours as needed for wheezing or shortness of breath.     . rosuvastatin (CRESTOR) 40 MG tablet TAKE 1 TABLET BY MOUTH ONCE DAILY FOR HIGH CHOLESTEROL    . sertraline (ZOLOFT) 100 MG tablet Take 1.5 tablets (150 mg total) by mouth daily. 45 tablet 1  . TRADJENTA 5 MG TABS tablet     . traZODone (DESYREL) 100 MG tablet Take 1-1.5 tablets (100-150 mg total) by mouth at bedtime as needed for sleep. 45 tablet 1  . amoxicillin (AMOXIL) 875 MG tablet TAKE 1 TABLET BY MOUTH TWICE DAILY FOR INFECTION FOR 10 DAYS    . ARIPiprazole (ABILIFY) 5 MG tablet Take 1 tablet (5 mg total) by mouth daily. 30 tablet 1  . doxycycline (VIBRA-TABS) 100 MG tablet Take 100 mg by mouth 2 (two) times daily. for 10 days    . fluconazole (DIFLUCAN) 150 MG tablet  fluconazole 150 mg tablet    . metFORMIN (GLUCOPHAGE-XR) 500 MG 24 hr tablet TAKE 1 TABLET BY MOUTH TWICE DAILY FOR DIABETES.     No current facility-administered medications for this visit.      Musculoskeletal: Strength & Muscle Tone: UTA Gait & Station: WNL Patient leans: N/A  Psychiatric Specialty Exam: Review of Systems  All other systems reviewed and are negative.   There were no vitals taken for this visit.There is no height or weight on file to calculate BMI.  General Appearance: UTA  Eye Contact:  UTA  Speech:  Clear and Coherent  Volume:  Normal  Mood:  Anxious  Affect:  UTA  Thought Process:  Goal Directed and Descriptions of Associations: Intact  Orientation:  Full (Time, Place, and Person)  Thought Content: Hallucinations: Visual coping ok  Suicidal Thoughts:  No  Homicidal Thoughts:  No  Memory:  Immediate;   Fair Recent;   Fair Remote;   Fair  Judgement:  Fair  Insight:  Fair  Psychomotor Activity:  UTA  Concentration:  Concentration: Fair and Attention Span: Fair  Recall:  [employer] of Knowledge: Fair  Language: Fair  Akathisia:  No  Handed:  Right  AIMS (if indicated): Denies tremors, rigidity  Assets:  Communication Skills Desire for Improvement Housing Social Support  ADL's:  Intact  Cognition: WNL  Sleep:  Fair   Screenings: AUDIT     Admission (Discharged) from 12/06/2015 in [facility]  Alcohol Use Disorder Identification Test Final Score (AUDIT)  1       Assessment and Plan: Triana is a 23 year old African-American female who is employed, lives in [place], has a history of depression, anxiety was evaluated by telemedicine today.  Patient with psychosocial stressors of the current pandemic, loss of her father who passed away.  Patient is coping okay and is compliant on medications.  She is motivated to stay in psychotherapy sessions.  Plan as noted below.  Plan MDD-improving Zoloft 150 mg p.o. daily Abilify  5 mg p.o. daily  GAD-improving Zoloft as prescribed  Insomnia-improving Trazodone 100 to 150 mg p.o.  nightly as needed  Follow-up in clinic in 1 month or sooner if needed.  December 2 at 4:20 PM  I have spent atleast 15 minutes non  face to face with patient today. More than 50 % of the time was spent for psychoeducation and supportive psychotherapy and care coordination. This note was generated in part or whole with voice recognition software. Voice recognition is usually quite accurate but there are transcription errors that can and very often do occur. I apologize for any typographical errors that were not detected and corrected.       Ursula Alert, MD 11/12/2018, 3:01 PM

## 2018-11-12 NOTE — Progress Notes (Signed)
No show- No response to call .

## 2018-12-11 ENCOUNTER — Encounter: Payer: Self-pay | Admitting: Licensed Clinical Social Worker

## 2018-12-11 ENCOUNTER — Ambulatory Visit (INDEPENDENT_AMBULATORY_CARE_PROVIDER_SITE_OTHER): Payer: BC Managed Care – PPO | Admitting: Licensed Clinical Social Worker

## 2018-12-11 ENCOUNTER — Other Ambulatory Visit: Payer: Self-pay

## 2018-12-11 DIAGNOSIS — F411 Generalized anxiety disorder: Secondary | ICD-10-CM

## 2018-12-11 DIAGNOSIS — F333 Major depressive disorder, recurrent, severe with psychotic symptoms: Secondary | ICD-10-CM | POA: Diagnosis not present

## 2018-12-11 NOTE — Progress Notes (Signed)
Virtual Visit via Video Note  I connected with Laurena Fenter on 12/11/18 at  8:00 AM EST by a video enabled telemedicine application and verified that I am speaking with the correct person using two identifiers.   I discussed the limitations of evaluation and management by telemedicine and the availability of in person appointments. The patient expressed understanding and agreed to proceed.  I discussed the assessment and treatment plan with the patient. The patient was provided an opportunity to ask questions and all were answered. The patient agreed with the plan and demonstrated an understanding of the instructions.   The patient was advised to call back or seek an in-person evaluation if the symptoms worsen or if the condition fails to improve as anticipated.  I provided 53 minutes of non-face-to-face time during this encounter.   Alden Hipp, LCSW    THERAPIST PROGRESS NOTE  Session Time: 0800  Participation Level: Active  Behavioral Response: NeatAlertAnxious  Type of Therapy: Individual Therapy  Treatment Goals addressed: Anxiety  Interventions: Supportive  Summary: Audrie Mauthe is a 23 y.o. female who presents with continued symptoms related to her diagnosis. 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 She reports the job at [employer] was always intended to be temporary until she found something new, but there was a Freight forwarder there that made her feel badly about herself. She reported she often felt others in the workplace thought she was stupid. LCSW asked if this stems from another experience in her past. Mariluz reported while working at [employer], she was treated very badly by a Freight forwarder who consistently called her stupid and made her feel she was performing poorly. Annia reports this is an  anxiety she carries around to each new job she encounters, and often worries about if others in the work place like her. We discussed how to challenge negative thoughts, and talked through how to bring logic into the situation rather than emotion. We discussed how to utilize CBT skills to challenge negative, anxious thoughts and replace them with more positive, rational ones. Serenna expressed understanding and agreement with this informaiton as well. We moved on to discussing her budget, as Chrishelle was upset with herself for not putting more money into savings and not meeting her goals. LCSW introduced the idea of setting more attainable goals to assist in building momentum and motivation. Sarajane expressed understanding and agreement.   Suicidal/Homicidal: No  Therapist Response: Makaelyn continues to work towards her tx goals but has not yet reached them. We will continue to work on emotional regulation skills and improving CBT skills to manage anxiety moving forward.   Plan: Return again in 4 weeks.  Diagnosis: Axis I: MDD    Axis II: No diagnosis    Alden Hipp, LCSW 12/11/2018

## 2018-12-30 ENCOUNTER — Encounter: Payer: Self-pay | Admitting: Psychiatry

## 2018-12-30 ENCOUNTER — Other Ambulatory Visit: Payer: Self-pay

## 2018-12-30 ENCOUNTER — Ambulatory Visit (INDEPENDENT_AMBULATORY_CARE_PROVIDER_SITE_OTHER): Payer: BC Managed Care – PPO | Admitting: Psychiatry

## 2018-12-30 DIAGNOSIS — F411 Generalized anxiety disorder: Secondary | ICD-10-CM

## 2018-12-30 DIAGNOSIS — F5105 Insomnia due to other mental disorder: Secondary | ICD-10-CM

## 2018-12-30 DIAGNOSIS — F3341 Major depressive disorder, recurrent, in partial remission: Secondary | ICD-10-CM

## 2018-12-30 MED ORDER — ARIPIPRAZOLE 5 MG PO TABS: 5.0000 mg | ORAL_TABLET | Freq: Every day | ORAL | 1 refills | Status: DC

## 2018-12-30 MED ORDER — TRAZODONE HCL 100 MG PO TABS: 100.0000 mg | ORAL_TABLET | Freq: Every evening | ORAL | 1 refills | Status: DC | PRN

## 2018-12-30 MED ORDER — SERTRALINE HCL 100 MG PO TABS: 150.0000 mg | ORAL_TABLET | Freq: Every day | ORAL | 1 refills | Status: DC

## 2018-12-30 NOTE — Progress Notes (Signed)
Virtual Visit via Video Note  I connected with Jaeleigh Gardiner on 12/30/18 at  4:20 PM EST by a video enabled telemedicine application and verified that I am speaking with the correct person using two identifiers.   I discussed the limitations of evaluation and management by telemedicine and the availability of in person appointments. The patient expressed understanding and agreed to proceed.    I discussed the assessment and treatment plan with the patient. The patient was provided an opportunity to ask questions and all were answered. The patient agreed with the plan and demonstrated an understanding of the instructions.   The patient was advised to call back or seek an in-person evaluation if the symptoms worsen or if the condition fails to improve as anticipated.   [facility] MD OP Progress Note  12/30/2018 4:35 PM Chelci Haughney  MRN:  [id number]  Chief Complaint:  Chief Complaint    Follow-up     HPI: Myrtie is a 23 year old African-American female, employed, lives in [place], has a history of MDD, GAD, insomnia was evaluated by telemedicine today.  Patient today reports she is currently doing well with regards to her mood symptoms.  She denies any significant depression or anxiety symptoms.  She reports she started a new job at the [employer] and is currently getting oriented.  She is excited about this new job.  She reports sleep is good.  Patient denies any hallucinations at this time.  She reports she is compliant on medications as prescribed.  She denies any side effects.  She reports she has started psychotherapy sessions with Ms. Alden Hipp and the therapy sessions are going well.  She reports she is going to get a pregnancy test since she is not sure if she is pregnant or not.  She however has an implant which she has been compliant with.  She however wants to make sure.  Discussed with patient the pregnancy implications of her current psychotropic medications.   Patient advised to call and let writer know if she is pregnant to make further medication readjustments if needed. Visit Diagnosis:    ICD-10-CM   1. MDD (major depressive disorder), recurrent, in partial remission (HCC)  F33.41 ARIPiprazole (ABILIFY) 5 MG tablet  2. GAD (generalized anxiety disorder)  F41.1 sertraline (ZOLOFT) 100 MG tablet  3. Insomnia due to mental condition  F51.05 traZODone (DESYREL) 100 MG tablet    Past Psychiatric History: I have reviewed past psychiatric history from my progress note on 02/12/2018.  Past Medical History:  Past Medical History:  Diagnosis Date  . Anxiety   . Asthma    well controlled  . Diabetes mellitus without complication ([place])    pt states she went off her meds because her numbers were fine  . Hidradenitis suppurativa 12/31 /2019  . Pneumonia 01/2017  . Refusal of blood product     Past Surgical History:  Procedure Laterality Date  . ADENOIDECTOMY Bilateral 02/09/2018   Procedure: REVISION ADENOIDECTOMY;  Surgeon: Carloyn Manner, MD;  Location: [facility];  Service: ENT;  Laterality: Bilateral;  . EYE SURGERY    . MYRINGOTOMY WITH TUBE PLACEMENT Right 02/09/2018   Procedure: MYRINGOTOMY WITH TUBE PLACEMENT;  Surgeon: Carloyn Manner, MD;  Location: [facility];  Service: ENT;  Laterality: Right;  . NASOPHARYNGOSCOPY EUSTATION TUBE BALLOON DILATION Bilateral 02/09/2018   Procedure: NASOPHARYNGOSCOPY EUSTATION TUBE BALLOON DILATION;  Surgeon: Carloyn Manner, MD;  Location: [facility];  Service: ENT;  Laterality: Bilateral;  . TONSILLECTOMY    . TURBINATE REDUCTION  Right 02/09/2018   Procedure: OUTFRACTURE RIGHT INFERIOR TURBINATE;  Surgeon: Carloyn Manner, MD;  Location: [facility];  Service: ENT;  Laterality: Right;    Family Psychiatric History: I have reviewed family psychiatric history from my progress note on 02/12/2018.  Family History:  Family History  Problem Relation Age of Onset  . Anxiety disorder Mother   . Depression  Mother   . Alcohol abuse Father   . Drug abuse Father   . Schizophrenia Maternal Uncle     Social History: I have reviewed social history from my progress note on 02/12/2018. Social History   Socioeconomic History  . Marital status: Single    Spouse name: Not on file  . Number of children: 0  . Years of education: Not on file  . Highest education level: Associate degree: occupational, Hotel manager, or vocational program  Occupational History  . Not on file  Social Needs  . Financial resource strain: Somewhat hard  . Food insecurity    Worry: Never true    Inability: Never true  . Transportation needs    Medical: No    Non-medical: No  Tobacco Use  . Smoking status: Never Smoker  . Smokeless tobacco: Never Used  Substance and Sexual Activity  . Alcohol use: Yes    Comment: rare  . Drug use: No  . Sexual activity: Yes    Partners: Male    Birth control/protection: Implant, Condom  Lifestyle  . Physical activity    Days per week: 0 days    Minutes per session: Not on file  . Stress: Rather much  Relationships  . Social connections    Talks on phone: More than three times a week    Gets together: More than three times a week    Attends religious service: More than 4 times per year    Active member of club or organization: No    Attends meetings of clubs or organizations: Never    Relationship status: Never married  Other Topics Concern  . Not on file  Social History Narrative  . Not on file    Allergies:  Allergies  Allergen Reactions  . Tramadol Other (See Comments)    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    Metabolic Disorder Labs: Lab Results  Component Value Date   HGBA1C 6.0 (H) 12/07/2015   MPG 126 12/07/2015   Lab Results  Component Value Date   PROLACTIN 14.1 12/07/2015   Lab Results  Component Value Date   CHOL 321 (H) 12/07/2015   TRIG 71 12/07/2015   HDL 41 12/07/2015   CHOLHDL 7.8 12/07/2015   VLDL 14 12/07/2015   LDLCALC 266 (H)  12/07/2015   Lab Results  Component Value Date   TSH 1.265 12/07/2015    Therapeutic Level Labs: No results found for: LITHIUM No results found for: VALPROATE No components found for:  CBMZ  Current Medications: Current Outpatient Medications  Medication Sig Dispense Refill  . ARIPiprazole (ABILIFY) 5 MG tablet Take 1 tablet (5 mg total) by mouth daily. 30 tablet 1  . Benzocaine (BOIL-EASE EX) Apply 1 application topically daily as needed (boils).    . bismuth subsalicylate (PEPTO BISMOL) 262 MG/15ML suspension Take 30 mLs by mouth every 6 (six) hours as needed for indigestion or diarrhea or loose stools.    Marland Kitchen etonogestrel (NEXPLANON) 68 MG IMPL implant 68 mg by Subdermal route once.     Marland Kitchen exenatide (BYETTA 10 MCG PEN) 10 MCG/0.04ML SOPN injection Inject 10 mcg  into the skin 2 (two) times daily with a meal.    . Fluticasone-Salmeterol (ADVAIR) 250-50 MCG/DOSE AEPB fluticasone 250 mcg-salmeterol 50 mcg/dose blistr powdr for inhalation    . JARDIANCE 25 MG TABS tablet     . letrozole (FEMARA) 2.5 MG tablet     . montelukast (SINGULAIR) 10 MG tablet TAKE 1 TABLET BY MOUTH ONCE DAILY FOR ASTHMA OR ALLERGIES.    Marland Kitchen norethindrone (AYGESTIN) 5 MG tablet Take 2.5 mg by mouth 2 (two) times daily.     Marland Kitchen omeprazole (PRILOSEC) 20 MG capsule TAKE 1 CAPSULE BY MOUTH ONCE DAILY FOR REFLUX    . PROAIR HFA 108 (90 Base) MCG/ACT inhaler Inhale 2 puffs into the lungs every 6 (six) hours as needed for wheezing or shortness of breath.     . rosuvastatin (CRESTOR) 40 MG tablet TAKE 1 TABLET BY MOUTH ONCE DAILY FOR HIGH CHOLESTEROL    . sertraline (ZOLOFT) 100 MG tablet Take 1.5 tablets (150 mg total) by mouth daily. 45 tablet 1  . TRADJENTA 5 MG TABS tablet     . traZODone (DESYREL) 100 MG tablet Take 1-1.5 tablets (100-150 mg total) by mouth at bedtime as needed for sleep. 45 tablet 1  . amoxicillin (AMOXIL) 875 MG tablet TAKE 1 TABLET BY MOUTH TWICE DAILY FOR INFECTION FOR 10 DAYS    . doxycycline  (VIBRA-TABS) 100 MG tablet Take 100 mg by mouth 2 (two) times daily. for 10 days    . fluconazole (DIFLUCAN) 150 MG tablet fluconazole 150 mg tablet    . ibuprofen (ADVIL,MOTRIN) 800 MG tablet Take 800 mg by mouth every 8 (eight) hours as needed for moderate pain.    . metFORMIN (GLUCOPHAGE-XR) 500 MG 24 hr tablet TAKE 1 TABLET BY MOUTH TWICE DAILY FOR DIABETES.     No current facility-administered medications for this visit.      Musculoskeletal: Strength & Muscle Tone: UTA Gait & Station: normal Patient leans: N/A  Psychiatric Specialty Exam: Review of Systems  Psychiatric/Behavioral: Negative for depression, hallucinations, substance abuse and suicidal ideas. The patient is not nervous/anxious.   All other systems reviewed and are negative.   There were no vitals taken for this visit.There is no height or weight on file to calculate BMI.  General Appearance: Casual  Eye Contact:  Fair  Speech:  Clear and Coherent  Volume:  Normal  Mood:  Euthymic  Affect:  Congruent  Thought Process:  Goal Directed and Descriptions of Associations: Intact  Orientation:  Full (Time, Place, and Person)  Thought Content: Logical   Suicidal Thoughts:  No  Homicidal Thoughts:  No  Memory:  Immediate;   Fair Recent;   Fair Remote;   Fair  Judgement:  Fair  Insight:  Fair  Psychomotor Activity:  Normal  Concentration:  Concentration: Fair and Attention Span: Fair  Recall:  [employer] of Knowledge: Fair  Language: Fair  Akathisia:  No  Handed:  Right  AIMS (if indicated): Denies tremors, rigidity  Assets:  Communication Skills Desire for Improvement Social Support  ADL's:  Intact  Cognition: WNL  Sleep:  Fair   Screenings: AUDIT     Admission (Discharged) from 12/06/2015 in [facility]  Alcohol Use Disorder Identification Test Final Score (AUDIT)  1       Assessment and Plan: Brooklynne is a 23 year old African-American female who is employed, lives in  [place], has a history of depression, anxiety was evaluated by telemedicine today.  Patient with psychosocial stressors of  the current pandemic, loss of her father who passed away.  Patient however is currently making progress.  Plan as noted below.  Plan MDD in partial remission Zoloft 150 mg p.o. daily Abilify 5 mg p.o. daily  GAD-stable Zoloft as prescribed  Insomnia-improving Trazodone 100 to 150 mg p.o. nightly as needed  Patient to continue psychotherapy sessions with therapist Ms. Alden Hipp  Patient reports she is going to get a pregnancy test, some time was spent educating patient about pregnancy implications of her current medications.  Patient advised to call writer back if her pregnancy test returns positive or to make an appointment sooner to discuss medications as needed.  Follow-up in clinic in 1 to 2 months or sooner if needed.  January 12 at 4 PM  I have spent atleast 15 minutes non face to face with patient today. More than 50 % of the time was spent for psychoeducation and supportive psychotherapy and care coordination. This note was generated in part or whole with voice recognition software. Voice recognition is usually quite accurate but there are transcription errors that can and very often do occur. I apologize for any typographical errors that were not detected and corrected.       Ursula Alert, MD 12/30/2018, 4:35 PM

## 2019-01-13 ENCOUNTER — Encounter: Payer: Self-pay | Admitting: Licensed Clinical Social Worker

## 2019-01-13 ENCOUNTER — Ambulatory Visit (INDEPENDENT_AMBULATORY_CARE_PROVIDER_SITE_OTHER): Payer: BC Managed Care – PPO | Admitting: Licensed Clinical Social Worker

## 2019-01-13 ENCOUNTER — Other Ambulatory Visit: Payer: Self-pay

## 2019-01-13 DIAGNOSIS — F3341 Major depressive disorder, recurrent, in partial remission: Secondary | ICD-10-CM | POA: Diagnosis not present

## 2019-01-13 DIAGNOSIS — F411 Generalized anxiety disorder: Secondary | ICD-10-CM | POA: Diagnosis not present

## 2019-01-14 NOTE — Progress Notes (Signed)
Virtual Visit via Telephone Note  I connected with Shari [name] on 01/14/19 at  3:30 PM EST by telephone and verified that I am speaking with the correct person using two identifiers.   I discussed the limitations, risks, security and privacy concerns of performing an evaluation and management service by telephone and the availability of in person appointments. I also discussed with the patient that there may be a patient responsible charge related to this service. The patient expressed understanding and agreed to proceed.     I discussed the assessment and treatment plan with the patient. The patient was provided an opportunity to ask questions and all were answered. The patient agreed with the plan and demonstrated an understanding of the instructions.   The patient was advised to call back or seek an in-person evaluation if the symptoms worsen or if the condition fails to improve as anticipated.  I provided 45 minutes of non-face-to-face time during this encounter.   Shari Hipp, LCSW   THERAPIST PROGRESS NOTE  Session Time: [id number]  Participation Level: Active  Behavioral Response: NeatAlertAnxious  Type of Therapy: Individual Therapy  Treatment Goals addressed: Coping  Interventions: CBT  Summary: Shari [name] is a 23 y.o. female who presents with continued symptoms related to her diagnosis. Shari [name] reports doing well since our last session. She reports she has been training at her new job, and went through a period of time where she felt she was not doing well and not learning at the same pace as others. After talking to her boyfriend about it, Shari [name] spoke with her supervisor who assured her she was doing well, and that not everyone is completing the same training materials. Shari [name] expressed that made her feel much better, and she was glad she took those steps. LCSW validated Shari [name]'s feelings, and encouraged her to continue challenging negative thoughts as they come up, as  they are most likely related to the previous work experience where she was made to feel stupid. Shari [name] expressed understanding and agreement with this idea. Shari [name] went on to report her sister is not doing well, and is going through significant depression. She reports her sister often threatens to kill herself, and Shari [name] feels she is just "saying that." LCSW validated that experience, and encouraged Shari [name] to bring her sister to the ED or urgent care to be evaluated, as it is too much to put on her alone. She reported her mother and grandmother have gotten mad at her for "not doing more," to help her sister. LCSW encouraged [name] to let her sister know she is there for her and check in on her, but outside of that, she is not responsible for anyone else's behaviors or mental health. Shari [name] expressed understanding and agreement.   Suicidal/Homicidal: No  Therapist Response: Shari [name] continues to work towards her tx goals but has not yet reached them. We will continue to work on improving emotional regulation skills and CBT skills moving forward.  Plan: Return again in 4 weeks.  Diagnosis: Axis I: Generalized Anxiety Disorder    Axis II: No diagnosis    Shari Hipp, LCSW 01/14/2019

## 2019-02-09 ENCOUNTER — Encounter: Payer: Self-pay | Admitting: Psychiatry

## 2019-02-09 ENCOUNTER — Ambulatory Visit (INDEPENDENT_AMBULATORY_CARE_PROVIDER_SITE_OTHER): Payer: BC Managed Care – PPO | Admitting: Psychiatry

## 2019-02-09 ENCOUNTER — Other Ambulatory Visit: Payer: Self-pay

## 2019-02-09 DIAGNOSIS — F331 Major depressive disorder, recurrent, moderate: Secondary | ICD-10-CM

## 2019-02-09 DIAGNOSIS — F5105 Insomnia due to other mental disorder: Secondary | ICD-10-CM | POA: Diagnosis not present

## 2019-02-09 DIAGNOSIS — F411 Generalized anxiety disorder: Secondary | ICD-10-CM

## 2019-02-09 MED ORDER — HYDROXYZINE HCL 25 MG PO TABS: 12.5000 mg | ORAL_TABLET | Freq: Two times a day (BID) | ORAL | 1 refills | Status: DC | PRN

## 2019-02-09 MED ORDER — SERTRALINE HCL 100 MG PO TABS: 200.0000 mg | ORAL_TABLET | Freq: Every day | ORAL | 1 refills | Status: DC

## 2019-02-09 NOTE — Progress Notes (Signed)
Virtual Visit via Video Note  I connected with Shari [name] on 02/09/19 at  4:00 PM EST by a video enabled telemedicine application and verified that I am speaking with the correct person using two identifiers.   I discussed the limitations of evaluation and management by telemedicine and the availability of in person appointments. The patient expressed understanding and agreed to proceed.     I discussed the assessment and treatment plan with the patient. The patient was provided an opportunity to ask questions and all were answered. The patient agreed with the plan and demonstrated an understanding of the instructions.   The patient was advised to call back or seek an in-person evaluation if the symptoms worsen or if the condition fails to improve as anticipated.   [facility] MD OP Progress Note  02/09/2019 4:55 PM Shari [name]  MRN:  [id number]  Chief Complaint:  Chief Complaint    Follow-up     HPI: Shari [name] is a 24 year old African-American female, employed, lives in [place], has a history of MDD, GAD, insomnia was evaluated by telemedicine today.  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 She reports she currently does not have any hours at the [employer] where she used to work. She reports she however has a job interview coming up at [employer] soon. She looks forward to that.  She reports she is currently going through all kinds of emotions, feels depressed, anxious as well as has mood swings. This has been getting worse since the past few days.  Patient reports sleep continues to be good on the trazodone.  Patient denies any suicidality, homicidality.  She reports she recently had visual hallucinations of something crawling on her wall, this was few days ago. However currently denies it. She does take Abilify which helps.  Patient denies any side effects to any of her medications at this time.  She reports she continues to be in psychotherapy sessions with Ms.  Alden Hipp which is going well.  Patient reports she has support system from her family. Visit Diagnosis:    ICD-10-CM   1. MDD (major depressive disorder), recurrent episode, moderate (HCC)  F33.1 hydrOXYzine (ATARAX/VISTARIL) 25 MG tablet  2. GAD (generalized anxiety disorder)  F41.1 hydrOXYzine (ATARAX/VISTARIL) 25 MG tablet    sertraline (ZOLOFT) 100 MG tablet  3. Insomnia due to mental condition  F51.05     Past Psychiatric History: I have reviewed past psychiatric history from my progress note on 02/12/2018.  Past Medical History:  Past Medical History:  Diagnosis Date  . Anxiety   . Asthma    well controlled  . Diabetes mellitus without complication ([place])    pt states she went off her meds because her numbers were fine  . Hidradenitis suppurativa 12/31 /2019  . Pneumonia 01/2017  . Refusal of blood product     Past Surgical History:  Procedure Laterality Date  . ADENOIDECTOMY Bilateral 02/09/2018   Procedure: REVISION ADENOIDECTOMY;  Surgeon: Carloyn Manner, MD;  Location: [facility] ORS;  Service: ENT;  Laterality: Bilateral;  . EYE SURGERY    . MYRINGOTOMY WITH TUBE PLACEMENT Right 02/09/2018   Procedure: MYRINGOTOMY WITH TUBE PLACEMENT;  Surgeon: Carloyn Manner, MD;  Location: [facility] ORS;  Service: ENT;  Laterality: Right;  . NASOPHARYNGOSCOPY EUSTATION TUBE BALLOON DILATION Bilateral 02/09/2018   Procedure: NASOPHARYNGOSCOPY EUSTATION TUBE BALLOON DILATION;  Surgeon: Carloyn Manner, MD;  Location: [facility] ORS;  Service: ENT;  Laterality: Bilateral;  . TONSILLECTOMY    . TURBINATE REDUCTION  Right 02/09/2018   Procedure: OUTFRACTURE RIGHT INFERIOR TURBINATE;  Surgeon: Carloyn Manner, MD;  Location: [facility] ORS;  Service: ENT;  Laterality: Right;    Family Psychiatric History: I have reviewed family psychiatric history from my progress note on 02/12/2018.  Family History:  Family History  Problem Relation Age of Onset  . Anxiety disorder Mother   . Depression Mother    . Alcohol abuse Father   . Drug abuse Father   . Schizophrenia Maternal Uncle     Social History: I have reviewed social history from my progress note on 02/12/2018. Social History   Socioeconomic History  . Marital status: Single    Spouse name: Not on file  . Number of children: 0  . Years of education: Not on file  . Highest education level: Associate degree: occupational, Hotel manager, or vocational program  Occupational History  . Not on file  Tobacco Use  . Smoking status: Never Smoker  . Smokeless tobacco: Never Used  Substance and Sexual Activity  . Alcohol use: Yes    Comment: rare  . Drug use: No  . Sexual activity: Yes    Partners: Male    Birth control/protection: Implant, Condom  Other Topics Concern  . Not on file  Social History Narrative  . Not on file   Social Determinants of Health   Financial Resource Strain:   . Difficulty of Paying Living Expenses: Not on file  Food Insecurity:   . Worried About Charity fundraiser in the Last Year: Not on file  . Ran Out of Food in the Last Year: Not on file  Transportation Needs:   . Lack of Transportation (Medical): Not on file  . Lack of Transportation (Non-Medical): Not on file  Physical Activity:   . Days of Exercise per Week: Not on file  . Minutes of Exercise per Session: Not on file  Stress:   . Feeling of Stress : Not on file  Social Connections:   . Frequency of Communication with Friends and Family: Not on file  . Frequency of Social Gatherings with Friends and Family: Not on file  . Attends Religious Services: Not on file  . Active Member of Clubs or Organizations: Not on file  . Attends Archivist Meetings: Not on file  . Marital Status: Not on file    Allergies:  Allergies  Allergen Reactions  . Tramadol Other (See Comments)    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    Metabolic Disorder Labs: Lab Results  Component Value Date   HGBA1C 6.0 (H) 12/07/2015   MPG 126 12/07/2015    Lab Results  Component Value Date   PROLACTIN 14.1 12/07/2015   Lab Results  Component Value Date   CHOL 321 (H) 12/07/2015   TRIG 71 12/07/2015   HDL 41 12/07/2015   CHOLHDL 7.8 12/07/2015   VLDL 14 12/07/2015   LDLCALC 266 (H) 12/07/2015   Lab Results  Component Value Date   TSH 1.265 12/07/2015    Therapeutic Level Labs: No results found for: LITHIUM No results found for: VALPROATE No components found for:  CBMZ  Current Medications: Current Outpatient Medications  Medication Sig Dispense Refill  . amoxicillin (AMOXIL) 875 MG tablet TAKE 1 TABLET BY MOUTH TWICE DAILY FOR INFECTION FOR 10 DAYS    . ARIPiprazole (ABILIFY) 5 MG tablet Take 1 tablet (5 mg total) by mouth daily. 30 tablet 1  . Benzocaine (BOIL-EASE EX) Apply 1 application topically daily as needed (boils).    Marland Kitchen  bismuth subsalicylate (PEPTO BISMOL) 262 MG/15ML suspension Take 30 mLs by mouth every 6 (six) hours as needed for indigestion or diarrhea or loose stools.    Marland Kitchen doxycycline (VIBRA-TABS) 100 MG tablet Take 100 mg by mouth 2 (two) times daily. for 10 days    . etonogestrel (NEXPLANON) 68 MG IMPL implant 68 mg by Subdermal route once.     Marland Kitchen exenatide (BYETTA 10 MCG PEN) 10 MCG/0.04ML SOPN injection Inject 10 mcg into the skin 2 (two) times daily with a meal.    . fluconazole (DIFLUCAN) 150 MG tablet fluconazole 150 mg tablet    . Fluticasone-Salmeterol (ADVAIR) 250-50 MCG/DOSE AEPB fluticasone 250 mcg-salmeterol 50 mcg/dose blistr powdr for inhalation    . hydrOXYzine (ATARAX/VISTARIL) 25 MG tablet Take 0.5-1 tablets (12.5-25 mg total) by mouth 2 (two) times daily as needed for anxiety. For anxiety and agitation 60 tablet 1  . ibuprofen (ADVIL,MOTRIN) 800 MG tablet Take 800 mg by mouth every 8 (eight) hours as needed for moderate pain.    Marland Kitchen JARDIANCE 25 MG TABS tablet     . letrozole (FEMARA) 2.5 MG tablet     . metFORMIN (GLUCOPHAGE-XR) 500 MG 24 hr tablet TAKE 1 TABLET BY MOUTH TWICE DAILY FOR DIABETES.     . montelukast (SINGULAIR) 10 MG tablet TAKE 1 TABLET BY MOUTH ONCE DAILY FOR ASTHMA OR ALLERGIES.    Marland Kitchen norethindrone (AYGESTIN) 5 MG tablet Take 2.5 mg by mouth 2 (two) times daily.     Marland Kitchen omeprazole (PRILOSEC) 20 MG capsule TAKE 1 CAPSULE BY MOUTH ONCE DAILY FOR REFLUX    . PROAIR HFA 108 (90 Base) MCG/ACT inhaler Inhale 2 puffs into the lungs every 6 (six) hours as needed for wheezing or shortness of breath.     . rosuvastatin (CRESTOR) 40 MG tablet TAKE 1 TABLET BY MOUTH ONCE DAILY FOR HIGH CHOLESTEROL    . sertraline (ZOLOFT) 100 MG tablet Take 2 tablets (200 mg total) by mouth daily. 60 tablet 1  . TRADJENTA 5 MG TABS tablet     . traZODone (DESYREL) 100 MG tablet Take 1-1.5 tablets (100-150 mg total) by mouth at bedtime as needed for sleep. 45 tablet 1   No current facility-administered medications for this visit.     Musculoskeletal: Strength & Muscle Tone: UTA Gait & Station: normal Patient leans: N/A  Psychiatric Specialty Exam: Review of Systems  Psychiatric/Behavioral: Positive for dysphoric mood. The patient is nervous/anxious.   All other systems reviewed and are negative.   There were no vitals taken for this visit.There is no height or weight on file to calculate BMI.  General Appearance: Casual  Eye Contact:  Fair  Speech:  Clear and Coherent  Volume:  Normal  Mood:  Anxious and Depressed  Affect:  Congruent  Thought Process:  Goal Directed and Descriptions of Associations: Intact  Orientation:  Full (Time, Place, and Person)  Thought Content: Logical   Suicidal Thoughts:  No  Homicidal Thoughts:  No  Memory:  Immediate;   Fair Recent;   Fair Remote;   Fair  Judgement:  Fair  Insight:  Fair  Psychomotor Activity:  Normal  Concentration:  Concentration: Fair and Attention Span: Fair  Recall:  [employer] of Knowledge: Fair  Language: Fair  Akathisia:  No  Handed:  Right  AIMS (if indicated): Denies tremors, rigidity  Assets:  Communication  Skills Desire for Improvement Housing Intimacy Social Support  ADL's:  Intact  Cognition: WNL  Sleep:  Fair  Screenings: AUDIT     Admission (Discharged) from 12/06/2015 in [place]  Alcohol Use Disorder Identification Test Final Score (AUDIT)  1       Assessment and Plan: Shari [name] is a 24 year old African-American female who is employed, lives in [place], has a history of depression, anxiety was evaluated by telemedicine today. Patient is currently struggling with psychosocial stressors of the pandemic, job loss . Patient currently struggles with mood swings and will benefit from medication readjustment and continued psychotherapy sessions.  Plan MDD-unstable Increase Zoloft to 200 mg p.o. daily Abilify 5 mg p.o. daily  GAD-unstable Increase Zoloft as discussed above. Continue CBT with Ms. Craig  Insomnia-improving Trazodone 100 to 200 mg p.o. nightly as needed  Follow-up in clinic in 2 to 3 weeks or sooner if needed. February 4 at 10:20 AM  I have spent atleast 20 minutes non face to face with patient today. More than 50 % of the time was spent for ordering medications and test ,psychoeducation and supportive psychotherapy and care coordination,as well as documenting clinical information in electronic health record. This note was generated in part or whole with voice recognition software. Voice recognition is usually quite accurate but there are transcription errors that can and very often do occur. I apologize for any typographical errors that were not detected and corrected.       Ursula Alert, MD 02/09/2019, 4:55 PM

## 2019-02-11 ENCOUNTER — Other Ambulatory Visit: Payer: Self-pay

## 2019-02-11 ENCOUNTER — Ambulatory Visit (INDEPENDENT_AMBULATORY_CARE_PROVIDER_SITE_OTHER): Payer: BC Managed Care – PPO | Admitting: Licensed Clinical Social Worker

## 2019-02-11 ENCOUNTER — Encounter: Payer: Self-pay | Admitting: Licensed Clinical Social Worker

## 2019-02-11 DIAGNOSIS — F411 Generalized anxiety disorder: Secondary | ICD-10-CM

## 2019-02-11 DIAGNOSIS — F331 Major depressive disorder, recurrent, moderate: Secondary | ICD-10-CM

## 2019-02-11 NOTE — Progress Notes (Signed)
Virtual Visit via Telephone Note  I connected with Shari [name] on 02/12/19 at  3:30 PM EST by telephone and verified that I am speaking with the correct person using two identifiers.   I discussed the limitations, risks, security and privacy concerns of performing an evaluation and management service by telephone and the availability of in person appointments. I also discussed with the patient that there may be a patient responsible charge related to this service. The patient expressed understanding and agreed to proceed.  I discussed the assessment and treatment plan with the patient. The patient was provided an opportunity to ask questions and all were answered. The patient agreed with the plan and demonstrated an understanding of the instructions.   The patient was advised to call back or seek an in-person evaluation if the symptoms worsen or if the condition fails to improve as anticipated.  I provided 30 minutes of non-face-to-face time during this encounter.   Alden Hipp, LCSW    THERAPIST PROGRESS NOTE  Session Time: [id number]  Participation Level: Active  Behavioral Response: Casual and MeticulousAlertAnxious  Type of Therapy: Individual Therapy  Treatment Goals addressed: Coping  Interventions: CBT  Summary: Shari [name] is a 24 y.o. female who presents with continued symptoms related to her diagnosis. Shari [name] reports not doing well since our last session. She reports she ended up leaving her job due to feeling it wasn't for her, "I didn't like the co-workers or the work environment. And, the way they had me doing training was crazy. It was like I was never going to get out of training." LCSW validated Shari [name]'s feelings and encouraged her to recognize how much insight it takes to walk away from a job when you know it will not be fulfilling. Shari [name] was able to see this after more input from LCSW. She reports feeling she isn't where she wanted to be in her life at this age.  LCSW validated Shari [name]'s feelings and held space for her to discuss her thoughts on the situation. LCSW encouraged Shari [name] to plan what she wants to do with her life and make new goals related to that plan. Shari [name] reported understanding and agreement, but noted she just feels defeated. LCSW validated that feeling as well, but highlighted the only way to make a change is to work towards it. Shari [name] expressed understanding and agreement. We discussed further ways Shari [name] could challenge her negative thoughts about herself moving forward.   Suicidal/Homicidal: No  Therapist Response: Shari [name] continues to work towards her tx goals but has not yet reached them. We will continue to work on improving emotional regulation and distress tolerance moving forward.   Plan: Return again in 4 weeks.  Diagnosis: Axis I: Generalized Anxiety Disorder    Axis II: No diagnosis    Alden Hipp, LCSW 02/11/2019

## 2019-03-03 ENCOUNTER — Ambulatory Visit (INDEPENDENT_AMBULATORY_CARE_PROVIDER_SITE_OTHER): Payer: BC Managed Care – PPO | Admitting: Licensed Clinical Social Worker

## 2019-03-03 ENCOUNTER — Other Ambulatory Visit: Payer: Self-pay

## 2019-03-03 ENCOUNTER — Encounter: Payer: Self-pay | Admitting: Licensed Clinical Social Worker

## 2019-03-03 DIAGNOSIS — F331 Major depressive disorder, recurrent, moderate: Secondary | ICD-10-CM | POA: Diagnosis not present

## 2019-03-03 DIAGNOSIS — F411 Generalized anxiety disorder: Secondary | ICD-10-CM | POA: Diagnosis not present

## 2019-03-03 DIAGNOSIS — E785 Hyperlipidemia, unspecified: Secondary | ICD-10-CM | POA: Insufficient documentation

## 2019-03-03 NOTE — Progress Notes (Signed)
Virtual Visit via Video Note  I connected with Shari [name] on 03/03/19 at  2:30 PM EST by a video enabled telemedicine application and verified that I am speaking with the correct person using two identifiers.   I discussed the limitations of evaluation and management by telemedicine and the availability of in person appointments. The patient expressed understanding and agreed to proceed.  I discussed the assessment and treatment plan with the patient. The patient was provided an opportunity to ask questions and all were answered. The patient agreed with the plan and demonstrated an understanding of the instructions.   The patient was advised to call back or seek an in-person evaluation if the symptoms worsen or if the condition fails to improve as anticipated.  I provided 30 minutes of non-face-to-face time during this encounter.   Alden Hipp, LCSW    THERAPIST PROGRESS NOTE  Session Time: 1430  Participation Level: Active  Behavioral Response: NeatAlertAnxious  Type of Therapy: Individual Therapy  Treatment Goals addressed: Coping  Interventions: Supportive  Summary: Shari [name] is a 24 y.o. female who presents with continued symptoms related to her diagnosis. Manali reports doing well since our last session, but noted ongoing anxiety and depression symptoms. Shari [name] reports feeling she is in "a rut," and feels like she can't move forward. "I've never had a full time job before, and I feel like something's always getting in my way." LCSW validated Shari [name]'s feelings and encouraged her to recognize she is still young, and still has time to grow. We discussed ways she could challenge negative thoughts about herself as they come up, and ways she could feel more productive while she is waiting on the job at [employer] to get back to her. Shari [name] expressed understanding and agreement with all information presented. Shari [name] reported feeling badly for allowing her mother, grandmother, and  boyfriend to help her financially while she is inbetween jobs. LCSW validated Shari [name]'s feelings and asked her if she felt her family would assist her financially if they were not able to. Shari [name] was able to articulate her family would likely help her in other ways if they were not able to afford to help her. LCSW encouraged Shari [name] to continue challenging thoughts as they came up, as we did during today's session. Shari [name] expressed understanding and agreement.   Suicidal/Homicidal: No  Therapist Response: Shari [name] continues to work towards her tx goals but has not yet reached them. We will continue to work on improving emotional regulation skills and distress tolerance moving forward. We will also continue to work on improving anxiety/depression via CBT skills.   Plan: Return again in 3 weeks.  Diagnosis: Axis I: Major Depression, single episode    Axis II: No diagnosis    Alden Hipp, LCSW 03/03/2019

## 2019-03-04 ENCOUNTER — Encounter: Payer: Self-pay | Admitting: Psychiatry

## 2019-03-04 ENCOUNTER — Ambulatory Visit (INDEPENDENT_AMBULATORY_CARE_PROVIDER_SITE_OTHER): Payer: BC Managed Care – PPO | Admitting: Psychiatry

## 2019-03-04 DIAGNOSIS — F3341 Major depressive disorder, recurrent, in partial remission: Secondary | ICD-10-CM

## 2019-03-04 DIAGNOSIS — F5105 Insomnia due to other mental disorder: Secondary | ICD-10-CM | POA: Diagnosis not present

## 2019-03-04 DIAGNOSIS — F411 Generalized anxiety disorder: Secondary | ICD-10-CM

## 2019-03-04 MED ORDER — TRAZODONE HCL 100 MG PO TABS: 100.0000 mg | ORAL_TABLET | Freq: Every evening | ORAL | 1 refills | Status: DC | PRN

## 2019-03-04 NOTE — Progress Notes (Signed)
Provider Location : [facility] Patient Location : Home   Virtual Visit via Video Note  I connected with Shari [name] on 03/04/19 at 10:20 AM EST by a video enabled telemedicine application and verified that I am speaking with the correct person using two identifiers.   I discussed the limitations of evaluation and management by telemedicine and the availability of in person appointments. The patient expressed understanding and agreed to proceed.     I discussed the assessment and treatment plan with the patient. The patient was provided an opportunity to ask questions and all were answered. The patient agreed with the plan and demonstrated an understanding of the instructions.   The patient was advised to call back or seek an in-person evaluation if the symptoms worsen or if the condition fails to improve as anticipated.   [place] MD OP Progress Note  03/04/2019 12:56 PM Shenitha Degolyer  MRN:  [id number]  Chief Complaint:  Chief Complaint    Follow-up     HPI: Shari [name] is a 24 year old African-American female, employed, lives in [place], has a history of MDD, GAD, insomnia was evaluated by telemedicine today.  Patient today reports she is currently doing well with regards to her mood symptoms.  She reports she however was seen in the emergency department recently since her blood sugar level was around 600.  She reports she also had endocrinology visit and is currently on medications.  She has follow-up scheduled.  She reports she has been watching her diet and does not know what may have contributed to it.  Patient reports she is compliant on medications as prescribed.  Patient denies any sleep problems.  She denies any appetite changes.  She denies any suicidality, homicidality .  She continues to have hallucinations of seeing spiders on the wall, when she wakes up first thing in the morning.  She reports that has improved and she has been able to cope with it.   Visit Diagnosis:     ICD-10-CM   1. MDD (major depressive disorder), recurrent, in partial remission ([place])  F33.41   2. GAD (generalized anxiety disorder)  F41.1   3. Insomnia due to mental condition  F51.05 traZODone (DESYREL) 100 MG tablet    Past Psychiatric History: I have reviewed past psychiatric history from my progress note on 02/12/2018.  Past Medical History:  Past Medical History:  Diagnosis Date  . Anxiety   . Asthma    well controlled  . Diabetes mellitus without complication ([place])    pt states she went off her meds because her numbers were fine  . Hidradenitis suppurativa 12/31 /2019  . Pneumonia 01/2017  . Refusal of blood product     Past Surgical History:  Procedure Laterality Date  . ADENOIDECTOMY Bilateral 02/09/2018   Procedure: REVISION ADENOIDECTOMY;  Surgeon: Carloyn Manner, MD;  Location: [facility] ORS;  Service: ENT;  Laterality: Bilateral;  . EYE SURGERY    . MYRINGOTOMY WITH TUBE PLACEMENT Right 02/09/2018   Procedure: MYRINGOTOMY WITH TUBE PLACEMENT;  Surgeon: Carloyn Manner, MD;  Location: [facility] ORS;  Service: ENT;  Laterality: Right;  . NASOPHARYNGOSCOPY EUSTATION TUBE BALLOON DILATION Bilateral 02/09/2018   Procedure: NASOPHARYNGOSCOPY EUSTATION TUBE BALLOON DILATION;  Surgeon: Carloyn Manner, MD;  Location: [facility] ORS;  Service: ENT;  Laterality: Bilateral;  . TONSILLECTOMY    . TURBINATE REDUCTION Right 02/09/2018   Procedure: OUTFRACTURE RIGHT INFERIOR TURBINATE;  Surgeon: Carloyn Manner, MD;  Location: [facility] ORS;  Service: ENT;  Laterality: Right;    Family Psychiatric History:  Reviewed family psychiatric history from my progress note on 02/12/2018. Family History:  Family History  Problem Relation Age of Onset  . Anxiety disorder Mother   . Depression Mother   . Alcohol abuse Father   . Drug abuse Father   . Schizophrenia Maternal Uncle     Social History: Reviewed social history from my progress note on 02/12/2018. Social History   Socioeconomic History   . Marital status: Single    Spouse name: Not on file  . Number of children: 0  . Years of education: Not on file  . Highest education level: Associate degree: occupational, Hotel manager, or vocational program  Occupational History  . Not on file  Tobacco Use  . Smoking status: Never Smoker  . Smokeless tobacco: Never Used  Substance and Sexual Activity  . Alcohol use: Yes    Comment: rare  . Drug use: No  . Sexual activity: Yes    Partners: Male    Birth control/protection: Implant, Condom  Other Topics Concern  . Not on file  Social History Narrative  . Not on file   Social Determinants of Health   Financial Resource Strain:   . Difficulty of Paying Living Expenses: Not on file  Food Insecurity:   . Worried About Charity fundraiser in the Last Year: Not on file  . Ran Out of Food in the Last Year: Not on file  Transportation Needs:   . Lack of Transportation (Medical): Not on file  . Lack of Transportation (Non-Medical): Not on file  Physical Activity:   . Days of Exercise per Week: Not on file  . Minutes of Exercise per Session: Not on file  Stress:   . Feeling of Stress : Not on file  Social Connections:   . Frequency of Communication with Friends and Family: Not on file  . Frequency of Social Gatherings with Friends and Family: Not on file  . Attends Religious Services: Not on file  . Active Member of Clubs or Organizations: Not on file  . Attends Archivist Meetings: Not on file  . Marital Status: Not on file    Allergies:  Allergies  Allergen Reactions  . Tramadol Other (See Comments)    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    Metabolic Disorder Labs: Lab Results  Component Value Date   HGBA1C 6.0 (H) 12/07/2015   MPG 126 12/07/2015   Lab Results  Component Value Date   PROLACTIN 14.1 12/07/2015   Lab Results  Component Value Date   CHOL 321 (H) 12/07/2015   TRIG 71 12/07/2015   HDL 41 12/07/2015   CHOLHDL 7.8 12/07/2015   VLDL 14  12/07/2015   LDLCALC 266 (H) 12/07/2015   Lab Results  Component Value Date   TSH 1.265 12/07/2015    Therapeutic Level Labs: No results found for: LITHIUM No results found for: VALPROATE No components found for:  CBMZ  Current Medications: Current Outpatient Medications  Medication Sig Dispense Refill  . Insulin Degludec (TRESIBA FLEXTOUCH) 200 UNIT/ML SOPN Inject into the skin.    Marland Kitchen amoxicillin (AMOXIL) 875 MG tablet TAKE 1 TABLET BY MOUTH TWICE DAILY FOR INFECTION FOR 10 DAYS    . Benzocaine (BOIL-EASE EX) Apply 1 application topically daily as needed (boils).    . bismuth subsalicylate (PEPTO BISMOL) 262 MG/15ML suspension Take 30 mLs by mouth every 6 (six) hours as needed for indigestion or diarrhea or loose stools.    Marland Kitchen doxycycline (VIBRA-TABS) 100 MG tablet Take  100 mg by mouth 2 (two) times daily. for 10 days    . etonogestrel (NEXPLANON) 68 MG IMPL implant 68 mg by Subdermal route once.     Marland Kitchen exenatide (BYETTA 10 MCG PEN) 10 MCG/0.04ML SOPN injection Inject 10 mcg into the skin 2 (two) times daily with a meal.    . fluconazole (DIFLUCAN) 150 MG tablet fluconazole 150 mg tablet    . Fluticasone-Salmeterol (ADVAIR) 250-50 MCG/DOSE AEPB fluticasone 250 mcg-salmeterol 50 mcg/dose blistr powdr for inhalation    . hydrOXYzine (ATARAX/VISTARIL) 25 MG tablet Take 0.5-1 tablets (12.5-25 mg total) by mouth 2 (two) times daily as needed for anxiety. For anxiety and agitation 60 tablet 1  . ibuprofen (ADVIL,MOTRIN) 800 MG tablet Take 800 mg by mouth every 8 (eight) hours as needed for moderate pain.    Marland Kitchen JARDIANCE 25 MG TABS tablet     . letrozole (FEMARA) 2.5 MG tablet     . metFORMIN (GLUCOPHAGE-XR) 500 MG 24 hr tablet TAKE 1 TABLET BY MOUTH TWICE DAILY FOR DIABETES.    . montelukast (SINGULAIR) 10 MG tablet TAKE 1 TABLET BY MOUTH ONCE DAILY FOR ASTHMA OR ALLERGIES.    Marland Kitchen norethindrone (AYGESTIN) 5 MG tablet Take 2.5 mg by mouth 2 (two) times daily.     Marland Kitchen omeprazole (PRILOSEC) 20 MG  capsule TAKE 1 CAPSULE BY MOUTH ONCE DAILY FOR REFLUX    . PROAIR HFA 108 (90 Base) MCG/ACT inhaler Inhale 2 puffs into the lungs every 6 (six) hours as needed for wheezing or shortness of breath.     . rosuvastatin (CRESTOR) 40 MG tablet TAKE 1 TABLET BY MOUTH ONCE DAILY FOR HIGH CHOLESTEROL    . sertraline (ZOLOFT) 100 MG tablet Take 2 tablets (200 mg total) by mouth daily. 60 tablet 1  . TRADJENTA 5 MG TABS tablet     . traZODone (DESYREL) 100 MG tablet Take 1-1.5 tablets (100-150 mg total) by mouth at bedtime as needed for sleep. 45 tablet 1   No current facility-administered medications for this visit.     Musculoskeletal: Strength & Muscle Tone: UTA Gait & Station: normal Patient leans: N/A  Psychiatric Specialty Exam: Review of Systems  Psychiatric/Behavioral: Positive for hallucinations.  All other systems reviewed and are negative.   There were no vitals taken for this visit.There is no height or weight on file to calculate BMI.  General Appearance: Casual  Eye Contact:  Fair  Speech:  Clear and Coherent  Volume:  Normal  Mood:  Euthymic  Affect:  Congruent  Thought Process:  Goal Directed and Descriptions of Associations: Intact  Orientation:  Full (Time, Place, and Person)  Thought Content: Hallucinations: Visual improving  Suicidal Thoughts:  No  Homicidal Thoughts:  No  Memory:  Immediate;   Fair Recent;   Fair Remote;   Fair  Judgement:  Fair  Insight:  Fair  Psychomotor Activity:  Normal  Concentration:  Concentration: Fair and Attention Span: Fair  Recall:  [employer] of Knowledge: Fair  Language: Fair  Akathisia:  No  Handed:  Right  AIMS (if indicated): Denies tremors, rigidity  Assets:  Communication Skills Desire for [place] Talents/Skills  ADL's:  Intact  Cognition: WNL  Sleep:  Fair   Screenings: AUDIT     Admission (Discharged) from 12/06/2015 in [facility]  Alcohol Use Disorder  Identification Test Final Score (AUDIT)  1       Assessment and Plan: Decker is a 24 year old African-American female who  is employed, lives in [place], has a history of depression, anxiety was evaluated by telemedicine today.  She has psychosocial stressors of the pandemic, job loss.  Patient is currently making progress however does have hypoglycemic episodes.  Discussed with patient the effect of Abilify .  Patient is agreeable to discontinuing Abilify.  She will monitor her symptoms closely.  Plan as noted below.  Plan MDD in partial remission Zoloft 200 mg p.o. daily Discontinue Abilify for possible side effects of hyperglycemia.  GAD-stable Zoloft as discussed above. Continue CBT as needed.  Insomnia-improving Trazodone 100 to 200 mg p.o. nightly as needed  Follow-up in clinic in 3 weeks or sooner if needed.  February 23 at 10:20 AM  I have spent atleast 20 minutes non face to face with patient today. More than 50 % of the time was spent for ordering medications and test ,psychoeducation and supportive psychotherapy and care coordination,as well as documenting clinical information in electronic health record. This note was generated in part or whole with voice recognition software. Voice recognition is usually quite accurate but there are transcription errors that can and very often do occur. I apologize for any typographical errors that were not detected and corrected.       Ursula Alert, MD 03/04/2019, 12:56 PM

## 2019-03-23 ENCOUNTER — Other Ambulatory Visit: Payer: Self-pay

## 2019-03-23 ENCOUNTER — Encounter: Payer: Self-pay | Admitting: Psychiatry

## 2019-03-23 ENCOUNTER — Ambulatory Visit (INDEPENDENT_AMBULATORY_CARE_PROVIDER_SITE_OTHER): Payer: BC Managed Care – PPO | Admitting: Psychiatry

## 2019-03-23 DIAGNOSIS — F5105 Insomnia due to other mental disorder: Secondary | ICD-10-CM

## 2019-03-23 DIAGNOSIS — F3341 Major depressive disorder, recurrent, in partial remission: Secondary | ICD-10-CM

## 2019-03-23 DIAGNOSIS — F411 Generalized anxiety disorder: Secondary | ICD-10-CM | POA: Diagnosis not present

## 2019-03-23 MED ORDER — SERTRALINE HCL 100 MG PO TABS: 200.0000 mg | ORAL_TABLET | Freq: Every day | ORAL | 1 refills | Status: DC

## 2019-03-23 NOTE — Progress Notes (Signed)
Provider Location : [facility] Patient Location : Home  Virtual Visit via Video Note  I connected with Shari [name] on 03/23/19 at 10:20 AM EST by a video enabled telemedicine application and verified that I am speaking with the correct person using two identifiers.   I discussed the limitations of evaluation and management by telemedicine and the availability of in person appointments. The patient expressed understanding and agreed to proceed.     I discussed the assessment and treatment plan with the patient. The patient was provided an opportunity to ask questions and all were answered. The patient agreed with the plan and demonstrated an understanding of the instructions.   The patient was advised to call back or seek an in-person evaluation if the symptoms worsen or if the condition fails to improve as anticipated.   [place] MD OP Progress Note  03/23/2019 12:36 PM Shari [name]  MRN:  [id number]  Chief Complaint:  Chief Complaint    Follow-up     HPI: Shari [name] is a 24 year old at [place] female, employed, lives in [place], has a history of MDD, GAD, insomnia was evaluated by telemedicine today.  Patient today reports she is currently struggling with a lot of psychosocial stressors.  She continues to struggle with uncontrolled diabetes.  She reports she is currently on insulin and that has helped to some extent.  She also has to watch her diet.  She is trying to do so.  She also reports she is going through the stressors of not being able to find a job, relationship struggles with her boyfriend.  She reports she is compliant on Zoloft and has been using hydroxyzine as needed which helps.  She also has upcoming appointment with her therapist.  She reports therapy sessions are beneficial.  She denies any perceptual disturbances at this time.  She reports appetite is fair and sleep is good.  Patient reports she is not interested in  any medication changes today and wants to stay  wherever she is with medication and wants to make use of therapy sessions.  She denies any suicidality or homicidality. Visit Diagnosis:    ICD-10-CM   1. MDD (major depressive disorder), recurrent, in partial remission ([place])  F33.41   2. GAD (generalized anxiety disorder)  F41.1 sertraline (ZOLOFT) 100 MG tablet  3. Insomnia due to mental condition  F51.05     Past Psychiatric History: I have reviewed past psychiatric history from my progress note on 02/12/2018.  Past Medical History:  Past Medical History:  Diagnosis Date  . Anxiety   . Asthma    well controlled  . Diabetes mellitus without complication ([place])    pt states she went off her meds because her numbers were fine  . Hidradenitis suppurativa 12/31 /2019  . Pneumonia 01/2017  . Refusal of blood product     Past Surgical History:  Procedure Laterality Date  . ADENOIDECTOMY Bilateral 02/09/2018   Procedure: REVISION ADENOIDECTOMY;  Surgeon: Carloyn Manner, MD;  Location: [facility];  Service: ENT;  Laterality: Bilateral;  . EYE SURGERY    . MYRINGOTOMY WITH TUBE PLACEMENT Right 02/09/2018   Procedure: MYRINGOTOMY WITH TUBE PLACEMENT;  Surgeon: Carloyn Manner, MD;  Location: [facility];  Service: ENT;  Laterality: Right;  . NASOPHARYNGOSCOPY EUSTATION TUBE BALLOON DILATION Bilateral 02/09/2018   Procedure: NASOPHARYNGOSCOPY EUSTATION TUBE BALLOON DILATION;  Surgeon: Carloyn Manner, MD;  Location: [facility];  Service: ENT;  Laterality: Bilateral;  . TONSILLECTOMY    . TURBINATE REDUCTION Right 02/09/2018   Procedure:  OUTFRACTURE RIGHT INFERIOR TURBINATE;  Surgeon: Carloyn Manner, MD;  Location: [facility];  Service: ENT;  Laterality: Right;    Family Psychiatric History: Reviewed family psychiatric history from my progress note on 02/12/2018.  Family History:  Family History  Problem Relation Age of Onset  . Anxiety disorder Mother   . Depression Mother   . Alcohol abuse Father   . Drug abuse Father   .  Schizophrenia Maternal Uncle     Social History: Reviewed social history from my progress note on 02/12/2018. Social History   Socioeconomic History  . Marital status: Single    Spouse name: Not on file  . Number of children: 0  . Years of education: Not on file  . Highest education level: Associate degree: occupational, Hotel manager, or vocational program  Occupational History  . Not on file  Tobacco Use  . Smoking status: Never Smoker  . Smokeless tobacco: Never Used  Substance and Sexual Activity  . Alcohol use: Yes    Comment: rare  . Drug use: No  . Sexual activity: Yes    Partners: Male    Birth control/protection: Implant, Condom  Other Topics Concern  . Not on file  Social History Narrative  . Not on file   Social Determinants of Health   Financial Resource Strain:   . Difficulty of Paying Living Expenses: Not on file  Food Insecurity:   . Worried About Charity fundraiser in the Last Year: Not on file  . Ran Out of Food in the Last Year: Not on file  Transportation Needs:   . Lack of Transportation (Medical): Not on file  . Lack of Transportation (Non-Medical): Not on file  Physical Activity:   . Days of Exercise per Week: Not on file  . Minutes of Exercise per Session: Not on file  Stress:   . Feeling of Stress : Not on file  Social Connections:   . Frequency of Communication with Friends and Family: Not on file  . Frequency of Social Gatherings with Friends and Family: Not on file  . Attends Religious Services: Not on file  . Active Member of Clubs or Organizations: Not on file  . Attends Archivist Meetings: Not on file  . Marital Status: Not on file    Allergies:  Allergies  Allergen Reactions  . Tramadol Other (See Comments)    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    Metabolic Disorder Labs: Lab Results  Component Value Date   HGBA1C 6.0 (H) 12/07/2015   MPG 126 12/07/2015   Lab Results  Component Value Date   PROLACTIN 14.1  12/07/2015   Lab Results  Component Value Date   CHOL 321 (H) 12/07/2015   TRIG 71 12/07/2015   HDL 41 12/07/2015   CHOLHDL 7.8 12/07/2015   VLDL 14 12/07/2015   LDLCALC 266 (H) 12/07/2015   Lab Results  Component Value Date   TSH 1.265 12/07/2015    Therapeutic Level Labs: No results found for: LITHIUM No results found for: VALPROATE No components found for:  CBMZ  Current Medications: Current Outpatient Medications  Medication Sig Dispense Refill  . amoxicillin (AMOXIL) 875 MG tablet TAKE 1 TABLET BY MOUTH TWICE DAILY FOR INFECTION FOR 10 DAYS    . Benzocaine (BOIL-EASE EX) Apply 1 application topically daily as needed (boils).    . bismuth subsalicylate (PEPTO BISMOL) 262 MG/15ML suspension Take 30 mLs by mouth every 6 (six) hours as needed for indigestion or diarrhea or loose  stools.    Marland Kitchen doxycycline (VIBRA-TABS) 100 MG tablet Take 100 mg by mouth 2 (two) times daily. for 10 days    . etonogestrel (NEXPLANON) 68 MG IMPL implant 68 mg by Subdermal route once.     Marland Kitchen exenatide (BYETTA 10 MCG PEN) 10 MCG/0.04ML SOPN injection Inject 10 mcg into the skin 2 (two) times daily with a meal.    . fluconazole (DIFLUCAN) 150 MG tablet fluconazole 150 mg tablet    . fluconazole (DIFLUCAN) 200 MG tablet     . Fluticasone-Salmeterol (ADVAIR) 250-50 MCG/DOSE AEPB fluticasone 250 mcg-salmeterol 50 mcg/dose blistr powdr for inhalation    . hydrOXYzine (ATARAX/VISTARIL) 25 MG tablet Take 0.5-1 tablets (12.5-25 mg total) by mouth 2 (two) times daily as needed for anxiety. For anxiety and agitation 60 tablet 1  . ibuprofen (ADVIL,MOTRIN) 800 MG tablet Take 800 mg by mouth every 8 (eight) hours as needed for moderate pain.    . Insulin Degludec (TRESIBA FLEXTOUCH) 200 UNIT/ML SOPN Inject into the skin.    Marland Kitchen JARDIANCE 25 MG TABS tablet     . ketoconazole (NIZORAL) 2 % cream Apply 1 application topically 2 (two) times daily.    Marland Kitchen letrozole (FEMARA) 2.5 MG tablet     . metFORMIN (GLUCOPHAGE-XR) 500  MG 24 hr tablet TAKE 1 TABLET BY MOUTH TWICE DAILY FOR DIABETES.    . montelukast (SINGULAIR) 10 MG tablet TAKE 1 TABLET BY MOUTH ONCE DAILY FOR ASTHMA OR ALLERGIES.    Marland Kitchen norethindrone (AYGESTIN) 5 MG tablet Take 2.5 mg by mouth 2 (two) times daily.     Marland Kitchen omeprazole (PRILOSEC) 20 MG capsule TAKE 1 CAPSULE BY MOUTH ONCE DAILY FOR REFLUX    . PROAIR HFA 108 (90 Base) MCG/ACT inhaler Inhale 2 puffs into the lungs every 6 (six) hours as needed for wheezing or shortness of breath.     . rosuvastatin (CRESTOR) 40 MG tablet TAKE 1 TABLET BY MOUTH ONCE DAILY FOR HIGH CHOLESTEROL    . sertraline (ZOLOFT) 100 MG tablet Take 2 tablets (200 mg total) by mouth daily. 60 tablet 1  . TRADJENTA 5 MG TABS tablet     . traZODone (DESYREL) 100 MG tablet Take 1-1.5 tablets (100-150 mg total) by mouth at bedtime as needed for sleep. 45 tablet 1  . TRUEPLUS PEN NEEDLES 32G X 4 MM MISC      No current facility-administered medications for this visit.     Musculoskeletal: Strength & Muscle Tone: UTA Gait & Station: Observed as seated Patient leans: N/A  Psychiatric Specialty Exam: Review of Systems  Psychiatric/Behavioral: The patient is nervous/anxious.   All other systems reviewed and are negative.   There were no vitals taken for this visit.There is no height or weight on file to calculate BMI.  General Appearance: Casual  Eye Contact:  Fair  Speech:  Clear and Coherent  Volume:  Normal  Mood:  Anxious  Affect:  Congruent  Thought Process:  Goal Directed and Descriptions of Associations: Intact  Orientation:  Full (Time, Place, and Person)  Thought Content: Logical   Suicidal Thoughts:  No  Homicidal Thoughts:  No  Memory:  Immediate;   Fair Recent;   Fair Remote;   Fair  Judgement:  Fair  Insight:  Fair  Psychomotor Activity:  Normal  Concentration:  Concentration: Fair and Attention Span: Fair  Recall:  [employer] of Knowledge: Fair  Language: Fair  Akathisia:  No  Handed:  Right  AIMS  (if indicated):UTA  Assets:  Communication Skills Desire for Improvement Housing Social Support  ADL's:  Intact  Cognition: WNL  Sleep:  Fair   Screenings: AUDIT     Admission (Discharged) from 12/06/2015 in [place] [facility]  Alcohol Use Disorder Identification Test Final Score (AUDIT)  1       Assessment and Plan: Shari [name] is a 25 year old African-American female who is employed, lives in [place], has a history of depression, anxiety was evaluated by telemedicine today.  Patient with psychosocial stressors of the pandemic, health problems and job-related stressors as well as relationship problems.  Patient continues to have anxiety symptoms and will benefit from medications and psychotherapy sessions.  Plan as noted below.  Plan MDD in partial remission Zoloft 200 mg p.o. daily Continue CBT.  GAD-unstable Zoloft as prescribed. Hydroxyzine 25 mg po bid as needed. Patient declines further medication changes and wants to make use of psychotherapy sessions.  She reports it is more situational.  Insomnia-improving Trazodone 100 to 200 mg p.o. nightly as needed  Follow-up in clinic in 3 weeks or sooner if needed.  March 16 at 11:30 AM  I have spent atleast 20 minutes non face to face with patient today. More than 50 % of the time was spent for  ordering medications and test ,psychoeducation and supportive psychotherapy and care coordination,as well as documenting clinical information in electronic health record. This note was generated in part or whole with voice recognition software. Voice recognition is usually quite accurate but there are transcription errors that can and very often do occur. I apologize for any typographical errors that were not detected and corrected.       Ursula Alert, MD 03/23/2019, 12:36 PM

## 2019-03-24 ENCOUNTER — Ambulatory Visit (INDEPENDENT_AMBULATORY_CARE_PROVIDER_SITE_OTHER): Payer: BC Managed Care – PPO | Admitting: Licensed Clinical Social Worker

## 2019-03-24 ENCOUNTER — Encounter: Payer: Self-pay | Admitting: Licensed Clinical Social Worker

## 2019-03-24 DIAGNOSIS — F3341 Major depressive disorder, recurrent, in partial remission: Secondary | ICD-10-CM | POA: Diagnosis not present

## 2019-03-24 NOTE — Progress Notes (Signed)
Virtual Visit via Video Note  I connected with Shari [name] on 03/24/19 at  2:30 PM EST by a video enabled telemedicine application and verified that I am speaking with the correct person using two identifiers.   I discussed the limitations of evaluation and management by telemedicine and the availability of in person appointments. The patient expressed understanding and agreed to proceed.  I discussed the assessment and treatment plan with the patient. The patient was provided an opportunity to ask questions and all were answered. The patient agreed with the plan and demonstrated an understanding of the instructions.   The patient was advised to call back or seek an in-person evaluation if the symptoms worsen or if the condition fails to improve as anticipated.  I provided 23 minutes of non-face-to-face time during this encounter.   Shari Hipp, LCSW    THERAPIST PROGRESS NOTE  Session Time: 1430  Participation Level: Active  Behavioral Response: NeatAlertAnxious  Type of Therapy: Individual Therapy  Treatment Goals addressed: Coping  Interventions: Supportive  Summary: Shari [name] is a 24 y.o. female who presents with continued symptoms related to her diagnosis. Shari [name] reports doing, "pretty badly," since our last session. She reports she has still not been able to find work, and is feeling "pretty worthless about that." LCSW held space for [facility] to vent and discuss her thoughts around not being able to find a job. Shari [name] reported the job she thought she would get at [employer] was given to someone who "was more qualified." Shari [name] reported, "that really hurt." LCSW validated Shari [name]'s feelings and encouraged her to recognize there will always be someone more ____, but the goal is to keep moving until we find the job that suits [place] best. Shari [name] expressed understanding and agreement. LCSW encouraged her to recognize the effort she is putting into finding a job and attempting to apply for  jobs during a pandemic, and encouraged Shari [name] to recognize the ways she is attempting to make things happen for her versus waiting for things to change. Shari [name] expressed understanding and agreement. Shari [name] reported she was actually eating out at a restaurant right now as she forgot about her appointment, so wanted to keep it short. LCSW expressed understanding and agreement.    Suicidal/Homicidal: No  Therapist Response: Shari [name] continues to work towards her tx goals but has not yet reached them. We will continue to work on improving emotional regulation skills and distress tolerance moving forward.   Plan: Return again in 2 weeks.  Diagnosis: Axis I: Generalized Anxiety Disorder    Axis II: No diagnosis    Shari Hipp, LCSW 03/24/2019

## 2019-03-28 ENCOUNTER — Other Ambulatory Visit: Payer: Self-pay

## 2019-03-28 ENCOUNTER — Emergency Department: Payer: BC Managed Care – PPO

## 2019-03-28 ENCOUNTER — Emergency Department
Admission: EM | Admit: 2019-03-28 | Discharge: 2019-03-29 | Disposition: A | Discharge status: Home / Self Care | Payer: BC Managed Care – PPO | Attending: Emergency Medicine | Admitting: Emergency Medicine

## 2019-03-28 ENCOUNTER — Encounter: Payer: Self-pay | Admitting: Emergency Medicine

## 2019-03-28 DIAGNOSIS — R0602 Shortness of breath: Secondary | ICD-10-CM | POA: Insufficient documentation

## 2019-03-28 DIAGNOSIS — J45909 Unspecified asthma, uncomplicated: Secondary | ICD-10-CM

## 2019-03-28 DIAGNOSIS — Z79899 Other long term (current) drug therapy: Secondary | ICD-10-CM | POA: Insufficient documentation

## 2019-03-28 DIAGNOSIS — E1365 Other specified diabetes mellitus with hyperglycemia: Secondary | ICD-10-CM | POA: Insufficient documentation

## 2019-03-28 DIAGNOSIS — Z7984 Long term (current) use of oral hypoglycemic drugs: Secondary | ICD-10-CM | POA: Diagnosis not present

## 2019-03-28 DIAGNOSIS — H6690 Otitis media, unspecified, unspecified ear: Secondary | ICD-10-CM | POA: Diagnosis not present

## 2019-03-28 DIAGNOSIS — R079 Chest pain, unspecified: Secondary | ICD-10-CM | POA: Diagnosis present

## 2019-03-28 LAB — COMPREHENSIVE METABOLIC PANEL
ALT: 29 U/L (ref 0–44)
AST: 25 U/L (ref 15–41)
Albumin: 3.6 g/dL (ref 3.5–5.0)
Alkaline Phosphatase: 98 U/L (ref 38–126)
Anion gap: 10 (ref 5–15)
BUN: 15 mg/dL (ref 6–20)
CO2: 24 mmol/L (ref 22–32)
Calcium: 8.8 mg/dL — ABNORMAL LOW (ref 8.9–10.3)
Chloride: 103 mmol/L (ref 98–111)
Creatinine, Ser: 0.76 mg/dL (ref 0.44–1.00)
GFR calc Af Amer: >60 mL/min (ref >60)
GFR calc non Af Amer: >60 mL/min (ref >60)
Glucose, Bld: 368 mg/dL — ABNORMAL HIGH (ref 70–99)
Potassium: 3.5 mmol/L (ref 3.5–5.1)
Sodium: 137 mmol/L (ref 135–145)
Total Bilirubin: 0.6 mg/dL (ref 0.3–1.2)
Total Protein: 8.1 g/dL (ref 6.5–8.1)

## 2019-03-28 LAB — CBC
HCT: 40 % (ref 36.0–46.0)
Hemoglobin: 12.4 g/dL (ref 12.0–15.0)
MCH: 23.4 pg — ABNORMAL LOW (ref 26.0–34.0)
MCHC: 31 g/dL (ref 30.0–36.0)
MCV: 75.5 fL — ABNORMAL LOW (ref 80.0–100.0)
Platelets: 385 10*3/uL (ref 150–400)
RBC: 5.3 MIL/uL — ABNORMAL HIGH (ref 3.87–5.11)
RDW: 19 % — ABNORMAL HIGH (ref 11.5–15.5)
WBC: 10.6 10*3/uL — ABNORMAL HIGH (ref 4.0–10.5)
nRBC: 0 % (ref 0.0–0.2)

## 2019-03-28 LAB — FIBRIN DERIVATIVES D-DIMER (ARMC ONLY): Fibrin derivatives D-dimer (ARMC): 477.29 ng/mL (FEU) (ref 0.00–499.00)

## 2019-03-28 LAB — TROPONIN I (HIGH SENSITIVITY): Troponin I (High Sensitivity): 2 ng/L (ref <18)

## 2019-03-28 LAB — LIPASE, BLOOD: Lipase: 28 U/L (ref 11–51)

## 2019-03-28 MED ORDER — ALBUTEROL SULFATE (2.5 MG/3ML) 0.083% IN NEBU
5.0000 mg | INHALATION_SOLUTION | Freq: Once | RESPIRATORY_TRACT | Status: AC
  Administered 2019-03-28: 21:00:00 5 mg via RESPIRATORY_TRACT
  Filled 2019-03-28: qty 6

## 2019-03-28 MED ORDER — AMOXICILLIN 500 MG PO CAPS: 500.0000 mg | ORAL_CAPSULE | Freq: Three times a day (TID) | ORAL | 0 refills | Status: DC

## 2019-03-28 NOTE — ED Notes (Signed)
Pt getting nebulizer treatment in triage. This RN will bring pt back when finished.

## 2019-03-28 NOTE — ED Provider Notes (Signed)
[facility] Emergency Department Provider Note  ____________________________________________  Time seen: Approximately 11:28 PM  I have reviewed the triage vital signs and the nursing notes.   HISTORY  Chief Complaint Asthma    HPI Shari [name] is a 24 y.o. female that presents to the emergency department for evaluation of asthma that has not been controlled with her albuterol inhalers today.  Patient states that this feels like her asthma.  Patient's blood sugars are uncontrolled and regularly in the 300s.  She has not yet taken her insulin today.  She received a breathing treatment in triage and states the SOB has improved but is still having some pain in her chest.  Patient also feels that she has an ear infection to her right ear.  She has a tube in this year that was placed 1 year ago.  She has been getting chronic ear infections.  She also has sinus congestion.  No nausea, vomiting. No fevers. She feels well overall.  Past Medical History:  Diagnosis Date  . Anxiety   . Asthma    well controlled  . Diabetes mellitus without complication ([place])    pt states she went off her meds because her numbers were fine  . Hidradenitis suppurativa 12/31 /2019  . Pneumonia 01/2017  . Refusal of blood product     Patient Active Problem List   Diagnosis Date Noted  . MDD (major depressive disorder), recurrent, in partial remission ([place]) 03/23/2019  . Hyperlipidemia 03/03/2019  . MDD (major depressive disorder), recurrent episode, moderate ([place]) 02/09/2019  . MDD (major depressive disorder), recurrent, severe, with psychosis ([place]) 09/10/2018  . GAD (generalized anxiety disorder) 09/10/2018  . 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 Fibroid uterus 06/10/2017  . Uterine mass 04/22/2017  . Pneumonia 01/28/2017  . Asthma 12/07/2015  . Diabetes mellitus without complication ([place]) [id number]  . Obesity 12/06/2015  . Chronic pain 12/06/2015  . Schizophrenia  spectrum disorder with psychotic disorder type not yet determined ([place]) 12/06/2015  . Pain in both feet 05/02/2015  . 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 Female pelvic pain 06/04/2012  . Pelvic and perineal pain 06/04/2012  . Incomplete emptying of bladder 12/13/2011  . Retention of urine 12/13/2011  . Symptoms involving urinary system 12/13/2011  . Urge incontinence 12/13/2011  . Increased frequency of urination 12/13/2011  . 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 Regular astigmatism 05/07/2010    Past Surgical History:  Procedure Laterality Date  . ADENOIDECTOMY Bilateral 02/09/2018   Procedure: REVISION ADENOIDECTOMY;  Surgeon: Carloyn Manner, MD;  Location: [facility] ORS;  Service: ENT;  Laterality: Bilateral;  . EYE SURGERY    . MYRINGOTOMY WITH TUBE PLACEMENT Right 02/09/2018   Procedure: MYRINGOTOMY WITH TUBE PLACEMENT;  Surgeon: Carloyn Manner, MD;  Location: [facility] ORS;  Service: ENT;  Laterality: Right;  . NASOPHARYNGOSCOPY EUSTATION TUBE BALLOON DILATION Bilateral 02/09/2018   Procedure: NASOPHARYNGOSCOPY EUSTATION TUBE BALLOON DILATION;  Surgeon: Carloyn Manner, MD;  Location: [facility] ORS;  Service: ENT;  Laterality: Bilateral;  . TONSILLECTOMY    . TURBINATE REDUCTION Right 02/09/2018   Procedure: OUTFRACTURE RIGHT INFERIOR TURBINATE;  Surgeon: Carloyn Manner, MD;  Location: [facility] ORS;  Service: ENT;  Laterality: Right;    Prior to Admission medications   Medication Sig Start Date End Date Taking? Authorizing Provider  amoxicillin (AMOXIL) 500 MG capsule Take 1 capsule (500 mg total) by mouth 3 (three) times daily. 03/28/19   Laban Emperor, PA-C  Benzocaine (BOIL-EASE EX) Apply 1 application topically daily as needed (boils).  [provider]  bismuth subsalicylate (PEPTO BISMOL) 262 MG/15ML suspension Take 30 mLs by mouth every 6 (six) hours as needed for indigestion or diarrhea or loose stools.    [provider]  doxycycline (VIBRA-TABS) 100 MG tablet Take 100 mg by  mouth 2 (two) times daily. for 10 days 10/23/18   [provider]  etonogestrel (NEXPLANON) 68 MG IMPL implant 68 mg by Subdermal route once.     [provider]  exenatide (BYETTA 10 MCG PEN) 10 MCG/0.04ML SOPN injection Inject 10 mcg into the skin 2 (two) times daily with a meal.    [provider]  fluconazole (DIFLUCAN) 150 MG tablet fluconazole 150 mg tablet    [provider]  fluconazole (DIFLUCAN) 200 MG tablet  12/01/18   [provider]  Fluticasone-Salmeterol (ADVAIR) 250-50 MCG/DOSE AEPB fluticasone 250 mcg-salmeterol 50 mcg/dose blistr powdr for inhalation    [provider]  hydrOXYzine (ATARAX/VISTARIL) 25 MG tablet Take 0.5-1 tablets (12.5-25 mg total) by mouth 2 (two) times daily as needed for anxiety. For anxiety and agitation 02/09/19   Ursula Alert, MD  ibuprofen (ADVIL,MOTRIN) 800 MG tablet Take 800 mg by mouth every 8 (eight) hours as needed for moderate pain.    [provider]  Insulin Degludec (TRESIBA FLEXTOUCH) 200 UNIT/ML SOPN Inject into the skin. 03/02/19   [provider]  JARDIANCE 25 MG TABS tablet  09/17/18   [provider]  ketoconazole (NIZORAL) 2 % cream Apply 1 application topically 2 (two) times daily. 12/01/18   [provider]  letrozole [facility]) 2.5 MG tablet  09/04/18   [provider]  metFORMIN (GLUCOPHAGE-XR) 500 MG 24 hr tablet TAKE 1 TABLET BY MOUTH TWICE DAILY FOR DIABETES. 05/05/18   [provider]  montelukast (SINGULAIR) 10 MG tablet TAKE 1 TABLET BY MOUTH ONCE DAILY FOR ASTHMA OR ALLERGIES. 05/05/18   [provider]  norethindrone (AYGESTIN) 5 MG tablet Take 2.5 mg by mouth 2 (two) times daily.  11/14/17   [provider]  omeprazole (PRILOSEC) 20 MG capsule TAKE 1 CAPSULE BY MOUTH ONCE DAILY FOR REFLUX 07/06/18   [provider]  PROAIR HFA 108 (90 Base) MCG/ACT inhaler Inhale 2 puffs into the lungs every 6 (six) hours as  needed for wheezing or shortness of breath.  12/09/17   [provider]  rosuvastatin (CRESTOR) 40 MG tablet TAKE 1 TABLET BY MOUTH ONCE DAILY FOR HIGH CHOLESTEROL 04/16/18   [provider]  sertraline (ZOLOFT) 100 MG tablet Take 2 tablets (200 mg total) by mouth daily. 03/23/19   Ursula Alert, MD  TRADJENTA 5 MG TABS tablet  07/14/18   [provider]  traZODone (DESYREL) 100 MG tablet Take 1-1.5 tablets (100-150 mg total) by mouth at bedtime as needed for sleep. 03/04/19   Ursula Alert, MD  TRUEPLUS PEN NEEDLES 32G X 4 MM MISC  02/08/19   [provider]    Allergies Sulfa antibiotics and Tramadol  Family History  Problem Relation Age of Onset  . Anxiety disorder Mother   . Depression Mother   . Alcohol abuse Father   . Drug abuse Father   . Schizophrenia Maternal Uncle     Social History Social History   Tobacco Use  . Smoking status: Never Smoker  . Smokeless tobacco: Never Used  Substance Use Topics  . Alcohol use: Yes    Comment: rare  . Drug use: No     Review of Systems  Constitutional: No  fever/chills Eyes: No visual changes. No discharge. ENT: Positive for congestion and rhinorrhea.  Positive for ear pain. Cardiovascular: Positive for chest tightness. Respiratory: Negative for cough. Positive SOB. Gastrointestinal: No abdominal pain.  No nausea, no vomiting.  No diarrhea.  No constipation. Musculoskeletal: Negative for musculoskeletal pain. Skin: Negative for rash, abrasions, lacerations, ecchymosis. Neurological: Negative for headaches.   ____________________________________________   PHYSICAL EXAM:  VITAL SIGNS: ED Triage Vitals  Enc Vitals Group     BP 03/28/19 2023 136/73     Pulse Rate 03/28/19 2023 99     Resp 03/28/19 2023 18     Temp 03/28/19 2023 98.5 F (36.9 C)     Temp Source 03/28/19 2023 Oral     SpO2 03/28/19 2023 98 %     Weight 03/28/19 2025 294 lb (133.4 kg)     Height 03/28/19 2025 5\' 8"   (1.727 m)     Head Circumference --      Peak Flow --      Pain Score 03/28/19 2031 0     Pain Loc --      Pain Edu? --      Excl. in [place]? --      Constitutional: Alert and oriented. Well appearing and in no acute distress. Eyes: Conjunctivae are normal. PERRL. EOMI. No discharge. Head: Atraumatic. ENT: No frontal and maxillary sinus tenderness.      Ears: Tube to right ear with yellow drainage.  Tympanic membrane is erythematous.      Nose: Mild congestion/rhinnorhea.      Mouth/Throat: Mucous membranes are moist. Oropharynx non-erythematous. Tonsils not enlarged. No exudates. Uvula midline. Neck: No stridor.   Hematological/Lymphatic/Immunilogical: No cervical lymphadenopathy. Cardiovascular: Normal rate, regular rhythm.  Good peripheral circulation. Respiratory: Normal respiratory effort without tachypnea or retractions. Lungs CTAB. Good air entry to the bases with no decreased or absent breath sounds. Gastrointestinal: Bowel sounds 4 quadrants. Soft and nontender to palpation. No guarding or rigidity. No palpable masses. No distention. Musculoskeletal: Full range of motion to all extremities. No gross deformities appreciated. Neurologic:  Normal speech and language. No gross focal neurologic deficits are appreciated.  Skin:  Skin is warm, dry and intact. No rash noted. Psychiatric: Mood and affect are normal. Speech and behavior are normal. Patient exhibits appropriate insight and judgement.   ____________________________________________   LABS (all labs ordered are listed, but only abnormal results are displayed)  Labs Reviewed  CBC - Abnormal; Notable for the following components:      Result Value   WBC 10.6 (*)    RBC 5.30 (*)    MCV 75.5 (*)    MCH 23.4 (*)    RDW 19.0 (*)    All other components within normal limits  COMPREHENSIVE METABOLIC PANEL - Abnormal; Notable for the following components:   Glucose, Bld 368 (*)    Calcium 8.8 (*)    All other components  within normal limits  LIPASE, BLOOD  FIBRIN DERIVATIVES D-DIMER ([facility] ONLY)  POC URINE PREG, ED  TROPONIN I (HIGH SENSITIVITY)  TROPONIN I (HIGH SENSITIVITY)   ____________________________________________  EKG  SR ____________________________________________  RADIOLOGY Robinette Haines, personally viewed and evaluated these images (plain radiographs) as part of my medical decision making, as well as reviewing the written report by the radiologist.  DG Chest 2 View  Result Date: 03/28/2019 CLINICAL DATA:  24 year old female with shortness of breath. EXAM: CHEST - 2 VIEW COMPARISON:  Chest radiograph dated 01/27/2017. FINDINGS: The heart size and mediastinal contours  are within normal limits. Both lungs are clear. The visualized skeletal structures are unremarkable. IMPRESSION: No active cardiopulmonary disease. Electronically Signed   By: Anner Crete M.D.   On: 03/28/2019 22:13    ____________________________________________    PROCEDURES  Procedure(s) performed:    Procedures    Medications  albuterol (PROVENTIL) (2.5 MG/3ML) 0.083% nebulizer solution 5 mg (5 mg Nebulization Given 03/28/19 2038)     ____________________________________________   INITIAL IMPRESSION / ASSESSMENT AND PLAN / ED COURSE  Pertinent labs & imaging results that were available during my care of the patient were reviewed by me and considered in my medical decision making (see chart for details).  Review of the [place] CSRS was performed in accordance of the [place] prior to dispensing any controlled drugs.  Differential includes, but is not limited to, viral syndrome, bronchitis including COPD exacerbation, pneumonia, reactive airway disease including asthma, CHF including exacerbation with or without pulmonary/interstitial edema, pneumothorax, ACS, thoracic trauma, and pulmonary embolism.   Patient presented to emergency department for evaluation of her asthma that was not controlled with her  inhaler today. Vital signs and exam are reassuring.  Chest x-ray negative for acute abnormalities.  EKG shows normal sinus rhythm.  Troponin and D-dimer are within reference range.  Patient was given an albuterol nebulizer treatment in the emergency department.  Following one albuterol treatment and waiting in her treatment room for 1 hour, patient denies any symptoms whatsoever.  She feels great and back to baseline.  Her blood sugar was elevated and patient states that her blood sugars have been this high for a a while.  No indication of DKA.  Anion gap is normal.  We discussed the importance of taking her medications as prescribed.  She has not yet taken her insulin for the day.  She lives close by and will take it as soon as she gets home.  She does not wish to stay for IV fluids and medication.  Patient also does have an ear infection to her right ear where she has a tube that was placed 1 year ago by ENT.  She will begin amoxicillin for this.  Patient appears well and is staying well hydrated. Patient feels comfortable going home.  Patient feels great at this time and is eager for discharge.  Patient will be discharged home with prescriptions for amoxicillin. Patient is to follow up with primary care, ENT as needed or otherwise directed. Patient is given ED precautions to return to the ED for any worsening or new symptoms.   Shari [name] was evaluated in Emergency Department on 03/28/2019 for the symptoms described in the history of present illness. She was evaluated in the context of the global COVID-19 pandemic, which necessitated consideration that the patient might be at risk for infection with the SARS-CoV-2 virus that causes COVID-19. Institutional protocols and algorithms that pertain to the evaluation of patients at risk for COVID-19 are in a state of rapid change based on information released by regulatory bodies including the CDC and federal and state organizations. These policies and algorithms  were followed during the patient's care in the ED.  ____________________________________________  FINAL CLINICAL IMPRESSION(S) / ED DIAGNOSES  Final diagnoses:  Uncomplicated asthma, unspecified asthma severity, unspecified whether persistent  Uncontrolled other specified diabetes mellitus with hyperglycemia ([place])  Acute otitis media, unspecified otitis media type      NEW MEDICATIONS STARTED DURING THIS VISIT:  ED Discharge Orders         Ordered  amoxicillin (AMOXIL) 500 MG capsule  3 times daily     03/28/19 2349              This chart was dictated using voice recognition software/Dragon. Despite best efforts to proofread, errors can occur which can change the meaning. Any change was purely unintentional.    Laban Emperor, PA-C 03/29/19 0047    Lavonia Drafts, MD 03/29/19 1620

## 2019-03-28 NOTE — Discharge Instructions (Addendum)
Your lab work is reassuring.  Your blood sugar was elevated.  Please take your insulin and medications when you get home.  Please continue using your albuterol inhalers.  I am giving you a prescription for amoxicillin for your ear infection.  Please follow-up with Dr. Elvina Mattes.

## 2019-03-28 NOTE — ED Notes (Signed)
Pt transported to xray 

## 2019-03-28 NOTE — ED Triage Notes (Signed)
Pt arrives ambulatory to triage with c/o asthma attack with her inhaler being ineffective at home. Pt is able to talk in complete sentences at this time and is in NAD.

## 2019-04-07 ENCOUNTER — Other Ambulatory Visit: Payer: Self-pay

## 2019-04-07 ENCOUNTER — Ambulatory Visit (INDEPENDENT_AMBULATORY_CARE_PROVIDER_SITE_OTHER): Payer: BC Managed Care – PPO | Admitting: Licensed Clinical Social Worker

## 2019-04-07 DIAGNOSIS — F411 Generalized anxiety disorder: Secondary | ICD-10-CM | POA: Diagnosis not present

## 2019-04-07 DIAGNOSIS — F3341 Major depressive disorder, recurrent, in partial remission: Secondary | ICD-10-CM | POA: Diagnosis not present

## 2019-04-08 ENCOUNTER — Encounter: Payer: Self-pay | Admitting: Licensed Clinical Social Worker

## 2019-04-08 NOTE — Progress Notes (Signed)
Virtual Visit via Video Note  I connected with Shari [name] on 04/08/19 at  3:30 PM EST by a video enabled telemedicine application and verified that I am speaking with the correct person using two identifiers.   I discussed the limitations of evaluation and management by telemedicine and the availability of in person appointments. The patient expressed understanding and agreed to proceed.    I discussed the assessment and treatment plan with the patient. The patient was provided an opportunity to ask questions and all were answered. The patient agreed with the plan and demonstrated an understanding of the instructions.   The patient was advised to call back or seek an in-person evaluation if the symptoms worsen or if the condition fails to improve as anticipated.  I provided 30 minutes of non-face-to-face time during this encounter.   Alden Hipp, LCSW    THERAPIST PROGRESS NOTE  Session Time: 1500  Participation Level: Minimal  Behavioral Response: CasualAlertAnxious  Type of Therapy: Individual Therapy  Treatment Goals addressed: Coping  Interventions: Strength-based  Summary: Shari [name] is a 24 y.o. female who presents with continued symptoms related to her diagnosis. Shari [name] reports she is currently with her grandmother, so could not talk for long. 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 She reports some anxiety regarding "not knowing how to be a lead," and not wanting to mess it up. LCSW validated Shari [name]'s feelings and encouraged her to challenge those negative thoughts as they come up. We discussed how to do this and walked through several examples. Shari [name] expressed understanding and agreement of how to best do this and how it could assist her in managing her anxiety. She reports everything else is going well, and  she is feeling better overall after getting more hours at work. LCSW validated these feelings and encouraged Shari [name] to continue applying for jobs while she has a little more security. Shari [name] expressed agreement.   Suicidal/Homicidal: No Therapist Response: Shari [name] continues to work towards her tx goals but has not yet reached them. We will continue to work on improving emotional regulation skills and distress tolerance moving forward.   Plan: Return again in 2 weeks.  Diagnosis: Axis I: Generalized Anxiety Disorder    Axis II: No diagnosis    Alden Hipp, LCSW 04/08/2019

## 2019-05-10 ENCOUNTER — Encounter: Payer: Self-pay | Admitting: Licensed Clinical Social Worker

## 2019-05-10 ENCOUNTER — Ambulatory Visit (INDEPENDENT_AMBULATORY_CARE_PROVIDER_SITE_OTHER): Payer: BC Managed Care – PPO | Admitting: Licensed Clinical Social Worker

## 2019-05-10 ENCOUNTER — Other Ambulatory Visit: Payer: Self-pay

## 2019-05-10 DIAGNOSIS — F411 Generalized anxiety disorder: Secondary | ICD-10-CM | POA: Diagnosis not present

## 2019-05-10 DIAGNOSIS — F3341 Major depressive disorder, recurrent, in partial remission: Secondary | ICD-10-CM | POA: Diagnosis not present

## 2019-05-10 NOTE — Progress Notes (Signed)
Virtual Visit via Telephone Note  I connected with [name] on 05/10/19 at 12:30 PM EDT by telephone and verified that I am speaking with the correct person using two identifiers.   I discussed the limitations, risks, security and privacy concerns of performing an evaluation and management service by telephone and the availability of in person appointments. I also discussed with the patient that there may be a patient responsible charge related to this service. The patient expressed understanding and agreed to proceed.    I discussed the assessment and treatment plan with the patient. The patient was provided an opportunity to ask questions and all were answered. The patient agreed with the plan and demonstrated an understanding of the instructions.   The patient was advised to call back or seek an in-person evaluation if the symptoms worsen or if the condition fails to improve as anticipated.  I provided 30 minutes of non-face-to-face time during this encounter.   Alden Hipp, LCSW    THERAPIST PROGRESS NOTE  Session Time: 1230  Participation Level: Active  Behavioral Response: CasualAlertDepressed  Type of Therapy: Individual Therapy  Treatment Goals addressed: Coping  Interventions: Supportive  Summary: Shari [name] is a 24 y.o. female who presents with continued symptoms related to her diagnosis. Arina reports doing "not so great," since our last conversation. She reports she has been working a lot, which has been good, but notes ongoing relationship problems. She reports her boyfriend has been "mean to her about what she is eating and how she's taking care of herself." LCSW held space for [facility] to discuss the things in her relationship that were causing her anxiety/stress. She reports her boyfriend also told her she was "lucky he was with her with all of her problems," including a problem she has around sex. LCSW encouraged Yessica to challenge that thought, and  validated her feelings of hurt and betrayal around that comment. We discussed ways to have a conversation with her boyfriend about his treatment of her recently, and what behaviors she is willing to put up with and which she is not. Satira expressed understanding and agreement with this information.   Suicidal/Homicidal: No  Therapist Response: Jeanmarie continues to work towards her tx goals but has not yet reached them. We will continue to work on improving emotional regulation skills and distress tolerance moving forward.   Plan: Return again in 2 weeks.  Diagnosis: Axis I: MDD & GAD    Axis II: No diagnosis    Alden Hipp, LCSW 05/10/2019

## 2019-05-14 ENCOUNTER — Other Ambulatory Visit: Payer: Self-pay

## 2019-05-14 ENCOUNTER — Telehealth (INDEPENDENT_AMBULATORY_CARE_PROVIDER_SITE_OTHER): Payer: BC Managed Care – PPO | Admitting: Psychiatry

## 2019-05-14 ENCOUNTER — Encounter: Payer: Self-pay | Admitting: Psychiatry

## 2019-05-14 DIAGNOSIS — F331 Major depressive disorder, recurrent, moderate: Secondary | ICD-10-CM | POA: Diagnosis not present

## 2019-05-14 DIAGNOSIS — F5105 Insomnia due to other mental disorder: Secondary | ICD-10-CM

## 2019-05-14 DIAGNOSIS — F411 Generalized anxiety disorder: Secondary | ICD-10-CM

## 2019-05-14 MED ORDER — SERTRALINE HCL 100 MG PO TABS: 200.0000 mg | ORAL_TABLET | Freq: Every day | ORAL | 1 refills | Status: DC

## 2019-05-14 MED ORDER — BUPROPION HCL 75 MG PO TABS: 75.0000 mg | ORAL_TABLET | Freq: Every morning | ORAL | 1 refills | Status: DC

## 2019-05-14 NOTE — Progress Notes (Signed)
Provider Location : [facility] Patient Location : Home  Virtual Visit via Telephone Note  I connected with [name] on 05/14/19 at 11:30 AM EDT by telephone and verified that I am speaking with the correct person using two identifiers.   I discussed the limitations, risks, security and privacy concerns of performing an evaluation and management service by telephone and the availability of in person appointments. I also discussed with the patient that there may be a patient responsible charge related to this service. The patient expressed understanding and agreed to proceed.      I discussed the assessment and treatment plan with the patient. The patient was provided an opportunity to ask questions and all were answered. The patient agreed with the plan and demonstrated an understanding of the instructions.   The patient was advised to call back or seek an in-person evaluation if the symptoms worsen or if the condition fails to improve as anticipated.   [facility] MD OP Progress Note  05/14/2019 12:34 PM Shari [name]  MRN:  [id number]  Chief Complaint:  Chief Complaint    Follow-up     HPI: Shari [name] is a 24 year old African-American female, employed, lives in [place], has a history of MDD, GAD, insomnia was evaluated by phone today.  Due to video connection problem the session today had to be completed by phone.  Patient today reports she is currently struggling with sadness, lack of motivation and so on.  She reports it is more so because of her relationship struggles with her boyfriend and work-related stressors.  She reports sleep is good.  She denies any perceptual disturbances.  She is compliant on medications as prescribed.  She is motivated to continue psychotherapy sessions.  Patient denies any suicidality or homicidality.  She denies any side effects to medications.  Visit Diagnosis:    ICD-10-CM   1. MDD (major depressive disorder), recurrent episode, moderate (HCC)   F33.1 buPROPion (WELLBUTRIN) 75 MG tablet  2. GAD (generalized anxiety disorder)  F41.1 sertraline (ZOLOFT) 100 MG tablet  3. Insomnia due to mental condition  F51.05     Past Psychiatric History: I have reviewed past psychiatric history from my progress note on 02/12/2018  Past Medical History:  Past Medical History:  Diagnosis Date  . Anxiety   . Asthma    well controlled  . Diabetes mellitus without complication ([place])    pt states she went off her meds because her numbers were fine  . Hidradenitis suppurativa 12/31 /2019  . Pneumonia 01/2017  . Refusal of blood product     Past Surgical History:  Procedure Laterality Date  . ADENOIDECTOMY Bilateral 02/09/2018   Procedure: REVISION ADENOIDECTOMY;  Surgeon: Carloyn Manner, MD;  Location: [facility] ORS;  Service: ENT;  Laterality: Bilateral;  . EYE SURGERY    . MYRINGOTOMY WITH TUBE PLACEMENT Right 02/09/2018   Procedure: MYRINGOTOMY WITH TUBE PLACEMENT;  Surgeon: Carloyn Manner, MD;  Location: [facility] ORS;  Service: ENT;  Laterality: Right;  . NASOPHARYNGOSCOPY EUSTATION TUBE BALLOON DILATION Bilateral 02/09/2018   Procedure: NASOPHARYNGOSCOPY EUSTATION TUBE BALLOON DILATION;  Surgeon: Carloyn Manner, MD;  Location: [facility] ORS;  Service: ENT;  Laterality: Bilateral;  . TONSILLECTOMY    . TURBINATE REDUCTION Right 02/09/2018   Procedure: OUTFRACTURE RIGHT INFERIOR TURBINATE;  Surgeon: Carloyn Manner, MD;  Location: [facility] ORS;  Service: ENT;  Laterality: Right;    Family Psychiatric History: I have reviewed family psychiatric history from my progress note on 02/12/2018  Family History:  Family History  Problem Relation  Age of Onset  . Anxiety disorder Mother   . Depression Mother   . Alcohol abuse Father   . Drug abuse Father   . Schizophrenia Maternal Uncle     Social History: Reviewed social history from my progress note on 02/12/2018 Social History   Socioeconomic History  . Marital status: Single    Spouse name: Not on  file  . Number of children: 0  . Years of education: Not on file  . Highest education level: Associate degree: occupational, Hotel manager, or vocational program  Occupational History  . Not on file  Tobacco Use  . Smoking status: Never Smoker  . Smokeless tobacco: Never Used  Substance and Sexual Activity  . Alcohol use: Yes    Comment: rare  . Drug use: No  . Sexual activity: Yes    Partners: Male    Birth control/protection: Implant, Condom  Other Topics Concern  . Not on file  Social History Narrative  . Not on file   Social Determinants of Health   Financial Resource Strain:   . Difficulty of Paying Living Expenses:   Food Insecurity:   . Worried About Charity fundraiser in the Last Year:   . Arboriculturist in the Last Year:   Transportation Needs:   . Film/video editor (Medical):   Marland Kitchen Lack of Transportation (Non-Medical):   Physical Activity:   . Days of Exercise per Week:   . Minutes of Exercise per Session:   Stress:   . Feeling of Stress :   Social Connections:   . Frequency of Communication with Friends and Family:   . Frequency of Social Gatherings with Friends and Family:   . Attends Religious Services:   . Active Member of Clubs or Organizations:   . Attends Archivist Meetings:   Marland Kitchen Marital Status:     Allergies:  Allergies  Allergen Reactions  . Sulfa Antibiotics Hives  . Tramadol Other (See Comments)    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    Metabolic Disorder Labs: Lab Results  Component Value Date   HGBA1C 6.0 (H) 12/07/2015   MPG 126 12/07/2015   Lab Results  Component Value Date   PROLACTIN 14.1 12/07/2015   Lab Results  Component Value Date   CHOL 321 (H) 12/07/2015   TRIG 71 12/07/2015   HDL 41 12/07/2015   CHOLHDL 7.8 12/07/2015   VLDL 14 12/07/2015   LDLCALC 266 (H) 12/07/2015   Lab Results  Component Value Date   TSH 1.265 12/07/2015    Therapeutic Level Labs: No results found for: LITHIUM No results  found for: VALPROATE No components found for:  CBMZ  Current Medications: Current Outpatient Medications  Medication Sig Dispense Refill  . Dulaglutide 0.75 MG/0.5ML SOPN Inject into the skin.    Marland Kitchen amoxicillin (AMOXIL) 500 MG capsule Take 1 capsule (500 mg total) by mouth 3 (three) times daily. 30 capsule 0  . Benzocaine (BOIL-EASE EX) Apply 1 application topically daily as needed (boils).    . bismuth subsalicylate (PEPTO BISMOL) 262 MG/15ML suspension Take 30 mLs by mouth every 6 (six) hours as needed for indigestion or diarrhea or loose stools.    Marland Kitchen buPROPion (WELLBUTRIN) 75 MG tablet Take 1 tablet (75 mg total) by mouth every morning. 30 tablet 1  . doxycycline (VIBRA-TABS) 100 MG tablet Take 100 mg by mouth 2 (two) times daily. for 10 days    . etonogestrel (NEXPLANON) 68 MG IMPL implant 68 mg  by Subdermal route once.     Marland Kitchen exenatide (BYETTA 10 MCG PEN) 10 MCG/0.04ML SOPN injection Inject 10 mcg into the skin 2 (two) times daily with a meal.    . fluconazole (DIFLUCAN) 150 MG tablet fluconazole 150 mg tablet    . fluconazole (DIFLUCAN) 200 MG tablet     . Fluticasone-Salmeterol (ADVAIR) 250-50 MCG/DOSE AEPB fluticasone 250 mcg-salmeterol 50 mcg/dose blistr powdr for inhalation    . hydrOXYzine (ATARAX/VISTARIL) 25 MG tablet Take 0.5-1 tablets (12.5-25 mg total) by mouth 2 (two) times daily as needed for anxiety. For anxiety and agitation 60 tablet 1  . ibuprofen (ADVIL,MOTRIN) 800 MG tablet Take 800 mg by mouth every 8 (eight) hours as needed for moderate pain.    . Insulin Degludec (TRESIBA FLEXTOUCH) 200 UNIT/ML SOPN Inject into the skin.    Marland Kitchen JARDIANCE 25 MG TABS tablet     . ketoconazole (NIZORAL) 2 % cream Apply 1 application topically 2 (two) times daily.    Marland Kitchen letrozole (FEMARA) 2.5 MG tablet     . metFORMIN (GLUCOPHAGE-XR) 500 MG 24 hr tablet TAKE 1 TABLET BY MOUTH TWICE DAILY FOR DIABETES.    Marland Kitchen metroNIDAZOLE (FLAGYL) 500 MG tablet Take 500 mg by mouth 2 (two) times daily.    .  montelukast (SINGULAIR) 10 MG tablet TAKE 1 TABLET BY MOUTH ONCE DAILY FOR ASTHMA OR ALLERGIES.    Marland Kitchen norethindrone (AYGESTIN) 5 MG tablet Take 2.5 mg by mouth 2 (two) times daily.     Marland Kitchen omeprazole (PRILOSEC) 20 MG capsule TAKE 1 CAPSULE BY MOUTH ONCE DAILY FOR REFLUX    . PROAIR HFA 108 (90 Base) MCG/ACT inhaler Inhale 2 puffs into the lungs every 6 (six) hours as needed for wheezing or shortness of breath.     . rosuvastatin (CRESTOR) 40 MG tablet TAKE 1 TABLET BY MOUTH ONCE DAILY FOR HIGH CHOLESTEROL    . sertraline (ZOLOFT) 100 MG tablet Take 2 tablets (200 mg total) by mouth daily. 60 tablet 1  . TRADJENTA 5 MG TABS tablet     . traZODone (DESYREL) 100 MG tablet Take 1-1.5 tablets (100-150 mg total) by mouth at bedtime as needed for sleep. 45 tablet 1  . TRUEPLUS PEN NEEDLES 32G X 4 MM MISC     . TRULICITY A999333 0000000 SOPN Inject 0.75 mg into the skin once a week.     No current facility-administered medications for this visit.     Musculoskeletal: Strength & Muscle Tone: UTA Gait & Station: UTA Patient leans: N/A  Psychiatric Specialty Exam: Review of Systems  Psychiatric/Behavioral: Positive for dysphoric mood. The patient is nervous/anxious.   All other systems reviewed and are negative.   There were no vitals taken for this visit.There is no height or weight on file to calculate BMI.  General Appearance: UTA  Eye Contact:  UTA  Speech:  Clear and Coherent  Volume:  Normal  Mood:  Anxious and Depressed  Affect:  UTA  Thought Process:  Goal Directed and Descriptions of Associations: Intact  Orientation:  Full (Time, Place, and Person)  Thought Content: Logical   Suicidal Thoughts:  No  Homicidal Thoughts:  No  Memory:  Immediate;   Fair Recent;   Fair Remote;   Good  Judgement:  Fair  Insight:  Fair  Psychomotor Activity:  UTA  Concentration:  Concentration: Fair and Attention Span: Fair  Recall:  [employer] of Knowledge: Fair  Language: Fair  Akathisia:  No   Handed:  Right  AIMS (if indicated): UTA  Assets:  Communication Skills Desire for Improvement Housing Social Support  ADL's:  Intact  Cognition: WNL  Sleep:  Fair   Screenings: AUDIT     Admission (Discharged) from 12/06/2015 in [place]  Alcohol Use Disorder Identification Test Final Score (AUDIT)  1       Assessment and Plan: Braylan is a 24 year old African-American female who is employed, lives in [place], has a history of depression, anxiety was evaluated by telemedicine today.  Patient with psychosocial stressors of the pandemic, health problems, relationship struggles and job related problems continues to struggle with depressive symptoms.  She will benefit from medication readjustment.  Plan as noted below.  Plan MDD-unstable Zoloft 200 mg p.o. daily. Add Wellbutrin 75 mg p.o. daily. Continue CBT  GAD-some progress Continue Zoloft as prescribed Hydroxyzine 25 mg p.o. twice daily as needed Continue psychotherapy sessions.  Insomnia-improving Trazodone 100 to 200 mg p.o. nightly as needed  Patient is aware that her therapist is leaving the practice and will establish care with a new therapist.  Follow-up in clinic in 3 to 4 weeks or sooner if needed.  I have spent atleast 20 minutes non face to face with patient today. More than 50 % of the time was spent for preparing to see the patient ( e.g., review of test, records ),  ordering medications and test ,psychoeducation and supportive psychotherapy and care coordination,as well as documenting clinical information in electronic health record. This note was generated in part or whole with voice recognition software. Voice recognition is usually quite accurate but there are transcription errors that can and very often do occur. I apologize for any typographical errors that were not detected and corrected.       Ursula Alert, MD 05/14/2019, 12:34 PM

## 2019-05-26 ENCOUNTER — Other Ambulatory Visit: Payer: Self-pay

## 2019-05-26 ENCOUNTER — Encounter: Payer: Self-pay | Admitting: *Deleted

## 2019-05-26 ENCOUNTER — Emergency Department
Admission: EM | Admit: 2019-05-26 | Discharge: 2019-05-26 | Disposition: A | Discharge status: Home / Self Care | Payer: BC Managed Care – PPO | Attending: Emergency Medicine | Admitting: Emergency Medicine

## 2019-05-26 DIAGNOSIS — R102 Pelvic and perineal pain: Secondary | ICD-10-CM | POA: Diagnosis present

## 2019-05-26 DIAGNOSIS — Z5321 Procedure and treatment not carried out due to patient leaving prior to being seen by health care provider: Secondary | ICD-10-CM | POA: Insufficient documentation

## 2019-05-26 DIAGNOSIS — N939 Abnormal uterine and vaginal bleeding, unspecified: Secondary | ICD-10-CM | POA: Insufficient documentation
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
## 2019-05-26 NOTE — ED Notes (Signed)
Called without response. Registration reports this patient has left ED.

## 2019-05-31 ENCOUNTER — Ambulatory Visit: Payer: BC Managed Care – PPO | Admitting: Dietician

## 2019-06-03 ENCOUNTER — Encounter: Payer: Self-pay | Admitting: Dietician

## 2019-06-03 NOTE — Progress Notes (Signed)
Have not heard back from patient to reschedule her missed appointment from 05/31/19. Sent notification to referring provider.

## 2019-06-07 DIAGNOSIS — G9601 Cranial cerebrospinal fluid leak, spontaneous: Secondary | ICD-10-CM | POA: Insufficient documentation

## 2019-06-09 ENCOUNTER — Emergency Department: Payer: BC Managed Care – PPO

## 2019-06-09 ENCOUNTER — Other Ambulatory Visit: Payer: Self-pay

## 2019-06-09 ENCOUNTER — Emergency Department
Admission: EM | Admit: 2019-06-09 | Discharge: 2019-06-09 | Disposition: A | Discharge status: Home / Self Care | Payer: BC Managed Care – PPO | Attending: Student | Admitting: Student

## 2019-06-09 DIAGNOSIS — J9801 Acute bronchospasm: Secondary | ICD-10-CM | POA: Insufficient documentation

## 2019-06-09 DIAGNOSIS — Z794 Long term (current) use of insulin: Secondary | ICD-10-CM | POA: Diagnosis not present

## 2019-06-09 DIAGNOSIS — Z79899 Other long term (current) drug therapy: Secondary | ICD-10-CM | POA: Diagnosis not present

## 2019-06-09 DIAGNOSIS — E119 Type 2 diabetes mellitus without complications: Secondary | ICD-10-CM | POA: Insufficient documentation

## 2019-06-09 DIAGNOSIS — R0789 Other chest pain: Secondary | ICD-10-CM | POA: Diagnosis present

## 2019-06-09 MED ORDER — PSEUDOEPH-BROMPHEN-DM 30-2-10 MG/5ML PO SYRP: 5.0000 mL | ORAL_SOLUTION | Freq: Four times a day (QID) | ORAL | 0 refills | Status: DC | PRN

## 2019-06-09 MED ORDER — METHYLPREDNISOLONE 4 MG PO TBPK: ORAL_TABLET | ORAL | 0 refills | Status: DC

## 2019-06-09 NOTE — ED Notes (Signed)
See triage note  Presents with some increased SOB for past few days  Hx of asthma   States she is only gets min relief with inhalers  Denies any fever  But has had occasional cough

## 2019-06-09 NOTE — ED Triage Notes (Signed)
Pt c/o flare up with her asthma for the past 3-4 days, pt is in NAD on arrival. Did not use her neb treatment today.

## 2019-06-09 NOTE — ED Notes (Addendum)
Patient was not in room or nearby restroom. Patient left before discharge instructions could be given and without discharge papers or vital signs. [place] PA-C aware.

## 2019-06-09 NOTE — Discharge Instructions (Signed)
Follow discharge care instruction take medication as directed.  Advised extra strength Tylenol for upper back pain.

## 2019-06-09 NOTE — ED Provider Notes (Signed)
[facility] Emergency Department Provider Note   ____________________________________________   First MD Initiated Contact with Patient 06/09/19 1001     (approximate)  I have reviewed the triage vital signs and the nursing notes.   HISTORY  Chief Complaint Asthma    HPI Shari [name] is a 24 y.o. female patient states flare of her asthma for the past 3 to 4 days.  Patient states tightness in her chest and upper back pain.  Patient complains refractory to inhaler and nebulized treatments.  Patient rates the pain as a 6/10.  Patient described the pain as "tightness".      Past Medical History:  Diagnosis Date  . Anxiety   . Asthma    well controlled  . Diabetes mellitus without complication ([place])    pt states she went off her meds because her numbers were fine  . Hidradenitis suppurativa 12/31 /2019  . Pneumonia 01/2017  . Refusal of blood product     Patient Active Problem List   Diagnosis Date Noted  . MDD (major depressive disorder), recurrent, in partial remission ([place]) 03/23/2019  . Hyperlipidemia 03/03/2019  . MDD (major depressive disorder), recurrent episode, moderate ([place]) 02/09/2019  . MDD (major depressive disorder), recurrent, severe, with psychosis ([place]) 09/10/2018  . GAD (generalized anxiety disorder) 09/10/2018  . 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 Fibroid uterus 06/10/2017  . Uterine mass 04/22/2017  . Pneumonia 01/28/2017  . Asthma 12/07/2015  . Diabetes mellitus without complication ([place]) [id number]  . Obesity 12/06/2015  . Chronic pain 12/06/2015  . Schizophrenia spectrum disorder with psychotic disorder type not yet determined ([place]) 12/06/2015  . Pain in both feet 05/02/2015  . 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 Female pelvic pain 06/04/2012  . Pelvic and perineal pain 06/04/2012  . Incomplete emptying of bladder 12/13/2011  . Retention of urine 12/13/2011  . Symptoms involving urinary system 12/13/2011   . Urge incontinence 12/13/2011  . Increased frequency of urination 12/13/2011  . 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 Regular astigmatism 05/07/2010    Past Surgical History:  Procedure Laterality Date  . ADENOIDECTOMY Bilateral 02/09/2018   Procedure: REVISION ADENOIDECTOMY;  Surgeon: Carloyn Manner, MD;  Location: [facility] ORS;  Service: ENT;  Laterality: Bilateral;  . EYE SURGERY    . MYRINGOTOMY WITH TUBE PLACEMENT Right 02/09/2018   Procedure: MYRINGOTOMY WITH TUBE PLACEMENT;  Surgeon: Carloyn Manner, MD;  Location: [facility] ORS;  Service: ENT;  Laterality: Right;  . NASOPHARYNGOSCOPY EUSTATION TUBE BALLOON DILATION Bilateral 02/09/2018   Procedure: NASOPHARYNGOSCOPY EUSTATION TUBE BALLOON DILATION;  Surgeon: Carloyn Manner, MD;  Location: [facility] ORS;  Service: ENT;  Laterality: Bilateral;  . TONSILLECTOMY    . TURBINATE REDUCTION Right 02/09/2018   Procedure: OUTFRACTURE RIGHT INFERIOR TURBINATE;  Surgeon: Carloyn Manner, MD;  Location: [facility] ORS;  Service: ENT;  Laterality: Right;    Prior to Admission medications   Medication Sig Start Date End Date Taking? Authorizing Provider  Benzocaine (BOIL-EASE EX) Apply 1 application topically daily as needed (boils).    [provider]  bismuth subsalicylate (PEPTO BISMOL) 262 MG/15ML suspension Take 30 mLs by mouth every 6 (six) hours as needed for indigestion or diarrhea or loose stools.    [provider]  brompheniramine-pseudoephedrine-DM 30-2-10 MG/5ML syrup Take 5 mLs by mouth 4 (four) times daily as needed. 06/09/19   Sable Feil, PA-C  buPROPion (WELLBUTRIN) 75 MG tablet Take 1 tablet (75 mg total) by mouth every morning. 05/14/19   Ursula Alert, MD  Dulaglutide  0.75 MG/0.5ML SOPN Inject into the skin. 03/29/19   [provider]  etonogestrel (NEXPLANON) 68 MG IMPL implant 68 mg by Subdermal route once.     [provider]  exenatide (BYETTA 10 MCG PEN) 10 MCG/0.04ML SOPN injection Inject 10 mcg into the  skin 2 (two) times daily with a meal.    [provider]  fluconazole (DIFLUCAN) 150 MG tablet fluconazole 150 mg tablet    [provider]  fluconazole (DIFLUCAN) 200 MG tablet  12/01/18   [provider]  Fluticasone-Salmeterol (ADVAIR) 250-50 MCG/DOSE AEPB fluticasone 250 mcg-salmeterol 50 mcg/dose blistr powdr for inhalation    [provider]  hydrOXYzine (ATARAX/VISTARIL) 25 MG tablet Take 0.5-1 tablets (12.5-25 mg total) by mouth 2 (two) times daily as needed for anxiety. For anxiety and agitation 02/09/19   Ursula Alert, MD  Insulin Degludec (TRESIBA FLEXTOUCH) 200 UNIT/ML SOPN Inject into the skin. 03/02/19   [provider]  JARDIANCE 25 MG TABS tablet  09/17/18   [provider]  ketoconazole (NIZORAL) 2 % cream Apply 1 application topically 2 (two) times daily. 12/01/18   [provider]  letrozole [facility]) 2.5 MG tablet  09/04/18   [provider]  metFORMIN (GLUCOPHAGE-XR) 500 MG 24 hr tablet TAKE 1 TABLET BY MOUTH TWICE DAILY FOR DIABETES. 05/05/18   [provider]  methylPREDNISolone (MEDROL DOSEPAK) 4 MG TBPK tablet Take Tapered dose as directed 06/09/19   Sable Feil, PA-C  montelukast (SINGULAIR) 10 MG tablet TAKE 1 TABLET BY MOUTH ONCE DAILY FOR ASTHMA OR ALLERGIES. 05/05/18   [provider]  norethindrone (AYGESTIN) 5 MG tablet Take 2.5 mg by mouth 2 (two) times daily.  11/14/17   [provider]  omeprazole (PRILOSEC) 20 MG capsule TAKE 1 CAPSULE BY MOUTH ONCE DAILY FOR REFLUX 07/06/18   [provider]  PROAIR HFA 108 (90 Base) MCG/ACT inhaler Inhale 2 puffs into the lungs every 6 (six) hours as needed for wheezing or shortness of breath.  12/09/17   [provider]  rosuvastatin (CRESTOR) 40 MG tablet TAKE 1 TABLET BY MOUTH ONCE DAILY FOR HIGH CHOLESTEROL 04/16/18   [provider]  sertraline (ZOLOFT) 100 MG tablet Take 2 tablets (200 mg total) by mouth  daily. 05/14/19   Ursula Alert, MD  TRADJENTA 5 MG TABS tablet  07/14/18   [provider]  traZODone (DESYREL) 100 MG tablet Take 1-1.5 tablets (100-150 mg total) by mouth at bedtime as needed for sleep. 03/04/19   Ursula Alert, MD  TRUEPLUS PEN NEEDLES 32G X 4 MM MISC  02/08/19   [provider]  TRULICITY A999333 0000000 SOPN Inject 0.75 mg into the skin once a week. 04/24/19   [provider]    Allergies Sulfa antibiotics and Tramadol  Family History  Problem Relation Age of Onset  . Anxiety disorder Mother   . Depression Mother   . Alcohol abuse Father   . Drug abuse Father   . Schizophrenia Maternal Uncle     Social History Social History   Tobacco Use  . Smoking status: Never Smoker  . Smokeless tobacco: Never Used  Substance Use Topics  . Alcohol use: Not Currently    Comment: rare  . Drug use: No    Review of Systems Constitutional: No fever/chills Eyes: No visual changes. ENT: No sore throat. Cardiovascular: Denies chest pain. Respiratory: Shortness of breath and cough. Gastrointestinal: No abdominal pain.  No nausea, no vomiting.  No diarrhea.  No  constipation. Genitourinary: Negative for dysuria. Musculoskeletal: Negative for back pain. Skin: Negative for rash. Neurological: Negative for headaches, focal weakness or numbness. Psychiatric:  Anxiety Endocrine:  Diabetes Allergic/Immunilogical: Sulfa and tramadol ____________________________________________   PHYSICAL EXAM:  VITAL SIGNS: ED Triage Vitals  Enc Vitals Group     BP 06/09/19 0911 (!) 150/74     Pulse Rate 06/09/19 0911 89     Resp 06/09/19 0911 18     Temp 06/09/19 0911 98.3 F (36.8 C)     Temp Source 06/09/19 0911 Oral     SpO2 06/09/19 0911 99 %     Weight 06/09/19 0912 286 lb (129.7 kg)     Height 06/09/19 0912 5\' 8"  (1.727 m)     Head Circumference --      Peak Flow --      Pain Score 06/09/19 0912 6     Pain Loc --      Pain Edu? --      Excl. in  [place]? --    Constitutional: Alert and oriented. Well appearing and in no acute distress.  Morbid obesity. Neck: No stridor.  Cardiovascular: Normal rate, regular rhythm. Grossly normal heart sounds.  Good peripheral circulation. Respiratory: Normal respiratory effort.  No retractions. Lungs CTAB.  Neurologic:  Normal speech and language. No gross focal neurologic deficits are appreciated. No gait instability. Skin:  Skin is warm, dry and intact. No rash noted. Psychiatric: Mood and affect are normal. Speech and behavior are normal.  ____________________________________________   LABS (all labs ordered are listed, but only abnormal results are displayed)  Labs Reviewed - No data to display ____________________________________________  EKG   ____________________________________________  RADIOLOGY  ED MD interpretation:    Official radiology report(s): DG Chest 2 View  Result Date: 06/09/2019 CLINICAL DATA:  Dyspnea EXAM: CHEST - 2 VIEW COMPARISON:  03/28/2019 FINDINGS: Cardiomediastinal contours and hilar structures are normal. Lungs are clear. No evidence of pleural effusion. Visualized skeletal structures are unremarkable. IMPRESSION: No acute cardiopulmonary disease. Electronically Signed   By: Zetta Bills M.D.   On: 06/09/2019 10:51    ____________________________________________   PROCEDURES  Procedure(s) performed (including Critical Care):  Procedures   ____________________________________________   INITIAL IMPRESSION / ASSESSMENT AND PLAN / ED COURSE  As part of my medical decision making, I reviewed the following data within the [place]      Patient presents 3 to 4 days of cough and shortness of breath.  Patient due her asthma has flared up.  Patient states no relief with nebulized treatments and inhalers.  Patient lungs are clear to auscultation.  Discussed no acute findings with chest x-ray.  Patient physical exam consistent with cough  due to bronchospasm.  Patient given discharge care instruction advised take medication as directed.  Patient vies follow-up PCP.        ____________________________________________   FINAL CLINICAL IMPRESSION(S) / ED DIAGNOSES  Final diagnoses:  Cough due to bronchospasm     ED Discharge Orders         Ordered    methylPREDNISolone (MEDROL DOSEPAK) 4 MG TBPK tablet     06/09/19 1108    brompheniramine-pseudoephedrine-DM 30-2-10 MG/5ML syrup  4 times daily PRN     06/09/19 1108           Note:  This document was prepared using Dragon voice recognition software and may include unintentional dictation errors.    Sable Feil, PA-C 06/09/19 1112    Lilia Pro., MD  06/09/19 1521  

## 2019-06-14 ENCOUNTER — Encounter: Payer: Self-pay | Admitting: Psychiatry

## 2019-06-14 ENCOUNTER — Other Ambulatory Visit: Payer: Self-pay

## 2019-06-14 ENCOUNTER — Telehealth (INDEPENDENT_AMBULATORY_CARE_PROVIDER_SITE_OTHER): Payer: BC Managed Care – PPO | Admitting: Psychiatry

## 2019-06-14 DIAGNOSIS — F331 Major depressive disorder, recurrent, moderate: Secondary | ICD-10-CM

## 2019-06-14 DIAGNOSIS — F5105 Insomnia due to other mental disorder: Secondary | ICD-10-CM

## 2019-06-14 DIAGNOSIS — F411 Generalized anxiety disorder: Secondary | ICD-10-CM

## 2019-06-14 MED ORDER — TRAZODONE HCL 100 MG PO TABS: 100.0000 mg | ORAL_TABLET | Freq: Every evening | ORAL | 1 refills | Status: DC | PRN

## 2019-06-14 MED ORDER — BUSPIRONE HCL 10 MG PO TABS: 10.0000 mg | ORAL_TABLET | Freq: Two times a day (BID) | ORAL | 1 refills | Status: DC

## 2019-06-14 MED ORDER — SERTRALINE HCL 100 MG PO TABS: 200.0000 mg | ORAL_TABLET | Freq: Every day | ORAL | 1 refills | Status: DC

## 2019-06-14 NOTE — Progress Notes (Signed)
Provider Location : [facility] Patient Location : [facility] Visit via Video Note  I connected with Shari [name] on 06/14/19 at  2:40 PM EDT by a video enabled telemedicine application and verified that I am speaking with the correct person using two identifiers.   I discussed the limitations of evaluation and management by telemedicine and the availability of in person appointments. The patient expressed understanding and agreed to proceed.    I discussed the assessment and treatment plan with the patient. The patient was provided an opportunity to ask questions and all were answered. The patient agreed with the plan and demonstrated an understanding of the instructions.   The patient was advised to call back or seek an in-person evaluation if the symptoms worsen or if the condition fails to improve as anticipated.  Shari [name] OP Progress Note  06/14/2019 5:54 PM Shari [name]  MRN:  [id number]  Chief Complaint:  Chief Complaint    Follow-up     HPI: Shari [name] is a 24 year old African-American female, employed, lives in [place], has a history of MDD, GAD, insomnia was evaluated by telemedicine today.  Patient today reports she is currently struggling with sadness, lack of motivation.  She reports she stopped taking the Wellbutrin which was recently started.  She reports she acted out in her sleep after starting that medication.  She reports she had stopped taking it and she does not have any more problems.  She reports sleep is okay.  She denies any significant nightmares.  She does have visual hallucinations of seeing spiders on and off however that does not bother her much.  It only happens when she first wake up in the morning.  Patient denies any suicidality, homicidality or perceptual disturbances.  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  This is on July 29 th.  She reports she is not very anxious about that.  She reports she is currently  looking for a new job since she has this current job only till may 29 th.  She reports she wants to continue psychotherapy sessions.  Patient denies any other concerns today.  Visit Diagnosis:    ICD-10-CM   1. MDD (major depressive disorder), recurrent episode, moderate (HCC)  F33.1 busPIRone (BUSPAR) 10 MG tablet  2. GAD (generalized anxiety disorder)  F41.1 busPIRone (BUSPAR) 10 MG tablet    sertraline (ZOLOFT) 100 MG tablet  3. Insomnia due to mental condition  F51.05 traZODone (DESYREL) 100 MG tablet    Past Psychiatric History: I have reviewed past psychiatric history from my progress note on 02/12/2018  Past Medical History:  Past Medical History:  Diagnosis Date  . Anxiety   . Asthma    well controlled  . Diabetes mellitus without complication ([place])    pt states she went off her meds because her numbers were fine  . Hidradenitis suppurativa 12/31 /2019  . Pneumonia 01/2017  . Refusal of blood product     Past Surgical History:  Procedure Laterality Date  . ADENOIDECTOMY Bilateral 02/09/2018   Procedure: REVISION ADENOIDECTOMY;  Surgeon: Carloyn Manner, [name];  Location: [facility];  Service: ENT;  Laterality: Bilateral;  . EYE SURGERY    . MYRINGOTOMY WITH TUBE PLACEMENT Right 02/09/2018   Procedure: MYRINGOTOMY WITH TUBE PLACEMENT;  Surgeon: Carloyn Manner, [name];  Location: [facility];  Service: ENT;  Laterality: Right;  . NASOPHARYNGOSCOPY EUSTATION TUBE BALLOON DILATION Bilateral 02/09/2018   Procedure: NASOPHARYNGOSCOPY EUSTATION TUBE BALLOON DILATION;  Surgeon: Carloyn Manner, [name];  Location: [facility];  Service: ENT;  Laterality: Bilateral;  . TONSILLECTOMY    . TURBINATE REDUCTION Right 02/09/2018   Procedure: OUTFRACTURE RIGHT INFERIOR TURBINATE;  Surgeon: Carloyn Manner, [name];  Location: [facility] OR[facility];  Service: ENT;  Laterality: Right;    Family Psychiatric History: I have reviewed family psychiatric history from my progress note on 02/12/2018  Family History:   Family History  Problem Relation Age of Onset  . Anxiety disorder Mother   . Depression Mother   . Alcohol abuse Father   . Drug abuse Father   . Schizophrenia Maternal Uncle     Social History: Reviewed social history from my progress note on 02/12/2018 Social History   Socioeconomic History  . Marital status: Single    Spouse name: Not on file  . Number of children: 0  . Years of education: Not on file  . Highest education level: Associate degree: occupational, Hotel manager, or vocational program  Occupational History  . Not on file  Tobacco Use  . Smoking status: Never Smoker  . Smokeless tobacco: Never Used  Substance and Sexual Activity  . Alcohol use: Not Currently    Comment: rare  . Drug use: No  . Sexual activity: Yes    Partners: Male    Birth control/protection: Implant, Condom  Other Topics Concern  . Not on file  Social History Narrative  . Not on file   Social Determinants of Health   Financial Resource Strain:   . Difficulty of Paying Living Expenses:   Food Insecurity:   . Worried About Charity fundraiser in the Last Year:   . Arboriculturist in the Last Year:   Transportation Needs:   . Film/video editor (Medical):   Marland Kitchen Lack of Transportation (Non-Medical):   Physical Activity:   . Days of Exercise per Week:   . Minutes of Exercise per Session:   Stress:   . Feeling of Stress :   Social Connections:   . Frequency of Communication with Friends and Family:   . Frequency of Social Gatherings with Friends and Family:   . Attends Religious Services:   . Active Member of Clubs or Organizations:   . Attends Archivist Meetings:   Marland Kitchen Marital Status:     Allergies:  Allergies  Allergen Reactions  . Sulfa Antibiotics Hives  . Tramadol Other (See Comments)    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    Metabolic Disorder Labs: Lab Results  Component Value Date   HGBA1C 6.0 (H) 12/07/2015   MPG 126 12/07/2015   Lab Results   Component Value Date   PROLACTIN 14.1 12/07/2015   Lab Results  Component Value Date   CHOL 321 (H) 12/07/2015   TRIG 71 12/07/2015   HDL 41 12/07/2015   CHOLHDL 7.8 12/07/2015   VLDL 14 12/07/2015   LDLCALC 266 (H) 12/07/2015   Lab Results  Component Value Date   TSH 1.265 12/07/2015    Therapeutic Level Labs: No results found for: LITHIUM No results found for: VALPROATE No components found for:  CBMZ  Current Medications: Current Outpatient Medications  Medication Sig Dispense Refill  . Benzocaine (BOIL-EASE EX) Apply 1 application topically daily as needed (boils).    . bismuth subsalicylate (PEPTO BISMOL) 262 MG/15ML suspension Take 30 mLs by mouth every 6 (six) hours as needed for indigestion or diarrhea or loose stools.    . brompheniramine-pseudoephedrine-DM 30-2-10 MG/5ML syrup Take 5 mLs by  mouth 4 (four) times daily as needed. 120 mL 0  . busPIRone (BUSPAR) 10 MG tablet Take 1 tablet (10 mg total) by mouth 2 (two) times daily. 60 tablet 1  . ciprofloxacin (CIPRO) 500 MG tablet TAKE 1 TABLET BY MOUTH TWICE DAILY FOR 14 DAYS    . ciprofloxacin-dexamethasone (CIPRODEX) OTIC suspension INSTILL 4 DROPS INTO RIGHT EAR TWICE DAILY FOR 14 DAYS    . Dulaglutide 0.75 MG/0.5ML SOPN Inject into the skin.    Marland Kitchen etonogestrel (NEXPLANON) 68 MG IMPL implant 68 mg by Subdermal route once.     Marland Kitchen exenatide (BYETTA 10 MCG PEN) 10 MCG/0.04ML SOPN injection Inject 10 mcg into the skin 2 (two) times daily with a meal.    . fluconazole (DIFLUCAN) 150 MG tablet fluconazole 150 mg tablet    . fluconazole (DIFLUCAN) 200 MG tablet     . Fluticasone-Salmeterol (ADVAIR) 250-50 MCG/DOSE AEPB fluticasone 250 mcg-salmeterol 50 mcg/dose blistr powdr for inhalation    . hydrOXYzine (ATARAX/VISTARIL) 25 MG tablet Take 0.5-1 tablets (12.5-25 mg total) by mouth 2 (two) times daily as needed for anxiety. For anxiety and agitation 60 tablet 1  . Insulin Degludec (TRESIBA FLEXTOUCH) 200 UNIT/ML SOPN Inject  into the skin.    Marland Kitchen JARDIANCE 25 MG TABS tablet     . ketoconazole (NIZORAL) 2 % cream Apply 1 application topically 2 (two) times daily.    Marland Kitchen letrozole (FEMARA) 2.5 MG tablet     . metFORMIN (GLUCOPHAGE-XR) 500 MG 24 hr tablet TAKE 1 TABLET BY MOUTH TWICE DAILY FOR DIABETES.    . methylPREDNISolone (MEDROL DOSEPAK) 4 MG TBPK tablet Take Tapered dose as directed 21 tablet 0  . montelukast (SINGULAIR) 10 MG tablet TAKE 1 TABLET BY MOUTH ONCE DAILY FOR ASTHMA OR ALLERGIES.    Marland Kitchen norethindrone (AYGESTIN) 5 MG tablet Take 2.5 mg by mouth 2 (two) times daily.     Marland Kitchen omeprazole (PRILOSEC) 20 MG capsule TAKE 1 CAPSULE BY MOUTH ONCE DAILY FOR REFLUX    . PROAIR HFA 108 (90 Base) MCG/ACT inhaler Inhale 2 puffs into the lungs every 6 (six) hours as needed for wheezing or shortness of breath.     . rosuvastatin (CRESTOR) 40 MG tablet TAKE 1 TABLET BY MOUTH ONCE DAILY FOR HIGH CHOLESTEROL    . sertraline (ZOLOFT) 100 MG tablet Take 2 tablets (200 mg total) by mouth daily. 60 tablet 1  . TRADJENTA 5 MG TABS tablet     . traZODone (DESYREL) 100 MG tablet Take 1-1.5 tablets (100-150 mg total) by mouth at bedtime as needed for sleep. 45 tablet 1  . TRUEPLUS PEN NEEDLES 32G X 4 MM MISC     . TRULICITY [id number] SOPN Inject 0.75 mg into the skin once a week.     No current facility-administered medications for this visit.     Musculoskeletal: Strength & Muscle Tone: UTA Gait & Station: normal Patient leans: N/A  Psychiatric Specialty Exam: Review of Systems  Psychiatric/Behavioral: Positive for dysphoric mood.  All other systems reviewed and are negative.   There were no vitals taken for this visit.There is no height or weight on file to calculate BMI.  General Appearance: Casual  Eye Contact:  Fair  Speech:  Normal Rate  Volume:  Normal  Mood:  Depressed  Affect:  Congruent  Thought Process:  Goal Directed and Descriptions of Associations: Intact  Orientation:  Full (Time, Place, and Person)   Thought Content: Hallucinations: Visual spiders - when she first wakes up  from her sleep- does not bother her  Suicidal Thoughts:  No  Homicidal Thoughts:  No  Memory:  Immediate;   Fair Recent;   Fair Remote;   Fair  Judgement:  Fair  Insight:  Fair  Psychomotor Activity:  Normal  Concentration:  Concentration: Fair and Attention Span: Fair  Recall:  [employer] of Knowledge: Fair  Language: Fair  Akathisia:  No  Handed:  Right  AIMS (if indicated): UTA  Assets:  Communication Skills Desire for Improvement Housing Social Support  ADL's:  Intact  Cognition: WNL  Sleep:  Fair   Screenings: AUDIT     Admission (Discharged) from 12/06/2015 in [place]  Alcohol Use Disorder Identification Test Final Score (AUDIT)  1       Assessment and Plan: Tonee Oconner is a 24 year old African-American female who is employed, lives in [place], has a history of depression, anxiety was evaluated by telemedicine today.  Patient with psychosocial stressors of the pandemic, health problems, relationship struggles job-related stressors continues to struggle with depression.  She will benefit from the following medication readjustment.  Plan MDD-unstable Zoloft 200 mg p.o. daily Start BuSpar 10 mg p.o. twice daily Discontinue Wellbutrin for side effects/noncompliance Encouraged to continue CBT.  GAD-unstable Zoloft as prescribed Start BuSpar 10 mg p.o. twice daily Continue CBT  Insomnia-stable Trazodone 100 to 200 mg p.o. nightly as needed  Follow-up in clinic in 3 to 4 weeks or sooner if needed.  I have spent atleast 20 minutes non face to face with patient today. More than 50 % of the time was spent for preparing to see the patient ( e.g., review of test, records ), ordering medications and test ,psychoeducation and supportive psychotherapy and care coordination,as well as documenting clinical information in electronic health record. This note was generated  in part or whole with voice recognition software. Voice recognition is usually quite accurate but there are transcription errors that can and very often do occur. I apologize for any typographical errors that were not detected and corrected.         Ursula Alert, [name] 06/14/2019, 5:54 PM

## 2019-07-23 ENCOUNTER — Telehealth (INDEPENDENT_AMBULATORY_CARE_PROVIDER_SITE_OTHER): Payer: BC Managed Care – PPO | Admitting: Psychiatry

## 2019-07-23 ENCOUNTER — Other Ambulatory Visit: Payer: Self-pay

## 2019-07-23 DIAGNOSIS — Z5329 Procedure and treatment not carried out because of patient's decision for other reasons: Secondary | ICD-10-CM

## 2019-07-23 NOTE — Progress Notes (Signed)
No response to call or text or video invite  

## 2019-09-27 ENCOUNTER — Telehealth (INDEPENDENT_AMBULATORY_CARE_PROVIDER_SITE_OTHER): Payer: BC Managed Care – PPO | Admitting: Psychiatry

## 2019-09-27 ENCOUNTER — Encounter: Payer: Self-pay | Admitting: Psychiatry

## 2019-09-27 ENCOUNTER — Other Ambulatory Visit: Payer: Self-pay

## 2019-09-27 DIAGNOSIS — F3342 Major depressive disorder, recurrent, in full remission: Secondary | ICD-10-CM | POA: Diagnosis not present

## 2019-09-27 DIAGNOSIS — F5105 Insomnia due to other mental disorder: Secondary | ICD-10-CM

## 2019-09-27 DIAGNOSIS — F411 Generalized anxiety disorder: Secondary | ICD-10-CM

## 2019-09-27 MED ORDER — SERTRALINE HCL 100 MG PO TABS: 200.0000 mg | ORAL_TABLET | Freq: Every day | ORAL | 1 refills | Status: DC

## 2019-09-27 MED ORDER — HYDROXYZINE HCL 25 MG PO TABS: 12.5000 mg | ORAL_TABLET | Freq: Two times a day (BID) | ORAL | 1 refills | Status: DC | PRN

## 2019-09-27 MED ORDER — TRAZODONE HCL 100 MG PO TABS: 100.0000 mg | ORAL_TABLET | Freq: Every evening | ORAL | 1 refills | Status: DC | PRN

## 2019-09-27 NOTE — Progress Notes (Addendum)
Provider Location : [facility] Patient Location : Home Participants: Patient , Provider  Virtual Visit via Video Note  I connected with Shari [name] on 09/27/19 at  8:30 AM EDT by a video enabled telemedicine application and verified that I am speaking with the correct person using two identifiers.   I discussed the limitations of evaluation and management by telemedicine and the availability of in person appointments. The patient expressed understanding and agreed to proceed.     I discussed the assessment and treatment plan with the patient. The patient was provided an opportunity to ask questions and all were answered. The patient agreed with the plan and demonstrated an understanding of the instructions.   The patient was advised to call back or seek an in-person evaluation if the symptoms worsen or if the condition fails to improve as anticipated.   [facility] MD OP Progress Note   09/27/2019 8:55 AM Shari [name]  MRN:  [id number]  Chief Complaint:  Chief Complaint    Follow-up     HPI: Shari [name] is a 24 year old African-American female, employed, lives in [place], has a history of MDD, GAD, insomnia was evaluated by telemedicine today.  Patient today reports she is currently recovering from her surgery.  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  She is currently recovering from the same.  She continues to take doxycycline.  She had her postoperative follow-ups and reports she is recovering well.  Patient reports mood wise she is doing okay.  She denies any significant sadness or crying spells.  She reports sleep is okay on the trazodone.  Patient denies any visual hallucinations at this time and reports she has not had that for a while.  She reports she is currently taking only the hydroxyzine, Zoloft of the trazodone and did not start the BuSpar as recommended at last visit.  She reports she was worried about starting a new medication.  At this time she does  not feel like she needs any new medications.  She reports her health insurance plan had offered her psychotherapy sessions and someone was supposed to call her.  She is waiting for the same.  Patient denies any suicidality, homicidality.  Patient denies any other concerns today.  Visit Diagnosis:    ICD-10-CM   1. MDD (major depressive disorder), recurrent, in full remission ([place])  F33.42 hydrOXYzine (ATARAX/VISTARIL) 25 MG tablet  2. GAD (generalized anxiety disorder)  F41.1 sertraline (ZOLOFT) 100 MG tablet    hydrOXYzine (ATARAX/VISTARIL) 25 MG tablet  3. Insomnia due to mental condition  F51.05 traZODone (DESYREL) 100 MG tablet    Past Psychiatric History: I have reviewed past psychiatric history from my progress note on 2018-02-12.  Past trials of Wellbutrin-side effects, BuSpar-noncompliance.  Past Medical History:  Past Medical History:  Diagnosis Date  . Anxiety   . Asthma    well controlled  . Diabetes mellitus without complication ([place])    pt states she went off her meds because her numbers were fine  . Hidradenitis suppurativa 12/31 /2019  . Pneumonia 01/2017  . Refusal of blood product     Past Surgical History:  Procedure Laterality Date  . ADENOIDECTOMY Bilateral 02/09/2018   Procedure: REVISION ADENOIDECTOMY;  Surgeon: Carloyn Manner, MD;  Location: [facility] OR[facility];  Service: ENT;  Laterality: Bilateral;  . EYE SURGERY    . MYRINGOTOMY WITH TUBE PLACEMENT Right 02/09/2018   Procedure: MYRINGOTOMY WITH TUBE PLACEMENT;  Surgeon: Carloyn Manner, MD;  Location: [facility];  Service: ENT;  Laterality: Right;  . NASOPHARYNGOSCOPY EUSTATION TUBE BALLOON DILATION Bilateral 02/09/2018   Procedure: NASOPHARYNGOSCOPY EUSTATION TUBE BALLOON DILATION;  Surgeon: Carloyn Manner, MD;  Location: [facility] OR[facility];  Service: ENT;  Laterality: Bilateral;  . TONSILLECTOMY    . TURBINATE REDUCTION Right 02/09/2018   Procedure: OUTFRACTURE RIGHT INFERIOR TURBINATE;  Surgeon: Carloyn Manner,  MD;  Location: [facility] OR[facility];  Service: ENT;  Laterality: Right;    Family Psychiatric History: I have reviewed family psychiatric history from my progress note on 02/12/2018  Family History:  Family History  Problem Relation Age of Onset  . Anxiety disorder Mother   . Depression Mother   . Alcohol abuse Father   . Drug abuse Father   . Schizophrenia Maternal Uncle     Social History: I have reviewed social history from my progress note on 02/12/2018 Social History   Socioeconomic History  . Marital status: Single    Spouse name: Not on file  . Number of children: 0  . Years of education: Not on file  . Highest education level: Associate degree: occupational, Hotel manager, or vocational program  Occupational History  . Not on file  Tobacco Use  . Smoking status: Never Smoker  . Smokeless tobacco: Never Used  Vaping Use  . Vaping Use: Never used  Substance and Sexual Activity  . Alcohol use: Not Currently    Comment: rare  . Drug use: No  . Sexual activity: Yes    Partners: Male    Birth control/protection: Implant, Condom  Other Topics Concern  . Not on file  Social History Narrative  . Not on file   Social Determinants of Health   Financial Resource Strain:   . Difficulty of Paying Living Expenses: Not on file  Food Insecurity:   . Worried About Charity fundraiser in the Last Year: Not on file  . Ran Out of Food in the Last Year: Not on file  Transportation Needs:   . Lack of Transportation (Medical): Not on file  . Lack of Transportation (Non-Medical): Not on file  Physical Activity:   . Days of Exercise per Week: Not on file  . Minutes of Exercise per Session: Not on file  Stress:   . Feeling of Stress : Not on file  Social Connections:   . Frequency of Communication with Friends and Family: Not on file  . Frequency of Social Gatherings with Friends and Family: Not on file  . Attends Religious Services: Not on file  . Active Member of Clubs or Organizations:  Not on file  . Attends Archivist Meetings: Not on file  . Marital Status: Not on file    Allergies:  Allergies  Allergen Reactions  . Sulfa Antibiotics Hives  . Tramadol Other (See Comments)    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    Metabolic Disorder Labs: Lab Results  Component Value Date   HGBA1C 6.0 (H) 12/07/2015   MPG 126 12/07/2015   Lab Results  Component Value Date   PROLACTIN 14.1 12/07/2015   Lab Results  Component Value Date   CHOL 321 (H) 12/07/2015   TRIG 71 12/07/2015   HDL 41 12/07/2015   CHOLHDL 7.8 12/07/2015   VLDL 14 12/07/2015   LDLCALC 266 (H) 12/07/2015   Lab Results  Component Value Date   TSH 1.265 12/07/2015    Therapeutic Level Labs: No results found for: LITHIUM No results found for: VALPROATE No components found for:  CBMZ  Current Medications: Current Outpatient Medications  Medication Sig Dispense Refill  . Benzocaine (BOIL-EASE EX) Apply 1 application topically daily as needed (boils).    . bismuth subsalicylate (PEPTO BISMOL) 262 MG/15ML suspension Take 30 mLs by mouth every 6 (six) hours as needed for indigestion or diarrhea or loose stools.    . brompheniramine-pseudoephedrine-DM 30-2-10 MG/5ML syrup Take 5 mLs by mouth 4 (four) times daily as needed. 120 mL 0  . doxycycline (VIBRAMYCIN) 100 MG capsule Take 100 mg by mouth 2 (two) times daily.    . Dulaglutide 0.75 MG/0.5ML SOPN Inject into the skin.    Marland Kitchen etonogestrel (NEXPLANON) 68 MG IMPL implant 68 mg by Subdermal route once.     Marland Kitchen exenatide (BYETTA 10 MCG PEN) 10 MCG/0.04ML SOPN injection Inject 10 mcg into the skin 2 (two) times daily with a meal.    . Fluticasone-Salmeterol (ADVAIR) 250-50 MCG/DOSE AEPB fluticasone 250 mcg-salmeterol 50 mcg/dose blistr powdr for inhalation    . hydrOXYzine (ATARAX/VISTARIL) 25 MG tablet Take 0.5-1 tablets (12.5-25 mg total) by mouth 2 (two) times daily as needed for anxiety. For anxiety and agitation 60 tablet 1  . Insulin  Degludec (TRESIBA FLEXTOUCH) 200 UNIT/ML SOPN Inject into the skin.    Marland Kitchen JARDIANCE 25 MG TABS tablet     . ketoconazole (NIZORAL) 2 % cream Apply 1 application topically 2 (two) times daily.    Marland Kitchen letrozole (FEMARA) 2.5 MG tablet     . metFORMIN (GLUCOPHAGE-XR) 500 MG 24 hr tablet TAKE 1 TABLET BY MOUTH TWICE DAILY FOR DIABETES.    . methylPREDNISolone (MEDROL DOSEPAK) 4 MG TBPK tablet Take Tapered dose as directed 21 tablet 0  . montelukast (SINGULAIR) 10 MG tablet TAKE 1 TABLET BY MOUTH ONCE DAILY FOR ASTHMA OR ALLERGIES.    Marland Kitchen norethindrone (AYGESTIN) 5 MG tablet Take 2.5 mg by mouth 2 (two) times daily.     Marland Kitchen omeprazole (PRILOSEC) 20 MG capsule TAKE 1 CAPSULE BY MOUTH ONCE DAILY FOR REFLUX    . PROAIR HFA 108 (90 Base) MCG/ACT inhaler Inhale 2 puffs into the lungs every 6 (six) hours as needed for wheezing or shortness of breath.     . rosuvastatin (CRESTOR) 40 MG tablet TAKE 1 TABLET BY MOUTH ONCE DAILY FOR HIGH CHOLESTEROL    . sertraline (ZOLOFT) 100 MG tablet Take 2 tablets (200 mg total) by mouth daily. 60 tablet 1  . TRADJENTA 5 MG TABS tablet     . traZODone (DESYREL) 100 MG tablet Take 1-1.5 tablets (100-150 mg total) by mouth at bedtime as needed for sleep. 45 tablet 1  . TRUEPLUS PEN NEEDLES 32G X 4 MM MISC      No current facility-administered medications for this visit.     Musculoskeletal: Strength & Muscle Tone: UTA Gait & Station: normal Patient leans: N/A  Psychiatric Specialty Exam: Review of Systems  Psychiatric/Behavioral: Negative for agitation, behavioral problems, confusion, decreased concentration, dysphoric mood, hallucinations, self-injury, sleep disturbance and suicidal ideas. The patient is not nervous/anxious and is not hyperactive.   All other systems reviewed and are negative.   There were no vitals taken for this visit.There is no height or weight on file to calculate BMI.  General Appearance: Casual  Eye Contact:  Fair  Speech:  Clear and Coherent   Volume:  Normal  Mood:  Euthymic  Affect:  Congruent  Thought Process:  Goal Directed and Descriptions of Associations: Intact  Orientation:  Full (Time, Place, and Person)  Thought Content: Logical   Suicidal Thoughts:  No  Homicidal  Thoughts:  No  Memory:  Immediate;   Fair Recent;   Fair Remote;   Fair  Judgement:  Fair  Insight:  Fair  Psychomotor Activity:  Normal  Concentration:  Concentration: Fair and Attention Span: Good  Recall:  [employer] of Knowledge: Fair  Language: Fair  Akathisia:  No  Handed:  Right  AIMS (if indicated): UTA  Assets:  Communication Skills Desire for Improvement Housing Social Support  ADL's:  Intact  Cognition: WNL  Sleep:  Fair   Screenings: AUDIT     Admission (Discharged) from 12/06/2015 in [facility]  Alcohol Use Disorder Identification Test Final Score (AUDIT) 1       Assessment and Plan: Shari [name] is a 24 year old African-American female who is employed, lives in [place], has a history of depression, anxiety was evaluated by telemedicine today.  Patient with psychosocial stressors of the pandemic, health problems, relationship struggles, job related stressors is currently making progress.  Plan as noted below.  Plan MDD in remission Zoloft 200 mg p.o. daily Discontinue BuSpar for noncompliance Continue CBT as needed.  Patient encouraged to contact her health insurance plan since she reports she is awaiting a counselor per them.  GAD-improving Zoloft as prescribed Continue hydroxyzine 12.5 to 25 mg as needed for severe anxiety symptoms Continue CBT  Insomnia-stable Trazodone 100 to 200 mg p.o. nightly as needed  I have reviewed medical records in [facility] from recent postoperative visit-otolaryngologist-Dr. 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  I have spent atleast 20 minutes- face to face with patient today. More than 50 % of the time was spent for preparing to see the patient ( e.g., review of test, records ), obtaining and to review and separately obtained history , ordering medications and test ,psychoeducation and supportive psychotherapy and care coordination,as well as documenting clinical information in electronic health record. This note was generated in part or whole with voice recognition software. Voice recognition is usually quite accurate but there are transcription errors that can and very often do occur. I apologize for any typographical errors that were not detected and corrected.      Ursula Alert, MD 09/28/2019, 7:57 AM

## 2019-11-22 ENCOUNTER — Other Ambulatory Visit: Payer: Self-pay

## 2019-11-22 ENCOUNTER — Telehealth (INDEPENDENT_AMBULATORY_CARE_PROVIDER_SITE_OTHER): Payer: BC Managed Care – PPO | Admitting: Psychiatry

## 2019-11-22 ENCOUNTER — Encounter: Payer: Self-pay | Admitting: Psychiatry

## 2019-11-22 DIAGNOSIS — F3342 Major depressive disorder, recurrent, in full remission: Secondary | ICD-10-CM | POA: Insufficient documentation

## 2019-11-22 DIAGNOSIS — F5105 Insomnia due to other mental disorder: Secondary | ICD-10-CM

## 2019-11-22 DIAGNOSIS — F411 Generalized anxiety disorder: Secondary | ICD-10-CM | POA: Diagnosis not present

## 2019-11-22 MED ORDER — SERTRALINE HCL 100 MG PO TABS: 200.0000 mg | ORAL_TABLET | Freq: Every day | ORAL | 1 refills | Status: DC

## 2019-11-22 MED ORDER — HYDROXYZINE HCL 25 MG PO TABS: 12.5000 mg | ORAL_TABLET | Freq: Two times a day (BID) | ORAL | 1 refills | Status: DC | PRN

## 2019-11-22 MED ORDER — TRAZODONE HCL 100 MG PO TABS: 100.0000 mg | ORAL_TABLET | Freq: Every evening | ORAL | 1 refills | Status: DC | PRN

## 2019-11-22 NOTE — Progress Notes (Signed)
Virtual Visit via Telephone Note  I connected with Shari [name] on 11/22/19 at  2:30 PM EDT by telephone and verified that I am speaking with the correct person using two identifiers.  Location Provider Location : [facility] Patient Location : Work  Participants: Patient , Provider   I discussed the limitations, risks, security and privacy concerns of performing an evaluation and management service by telephone and the availability of in person appointments. I also discussed with the patient that there may be a patient responsible charge related to this service. The patient expressed understanding and agreed to proceed.     I discussed the assessment and treatment plan with the patient. The patient was provided an opportunity to ask questions and all were answered. The patient agreed with the plan and demonstrated an understanding of the instructions.   The patient was advised to call back or seek an in-person evaluation if the symptoms worsen or if the condition fails to improve as anticipated.   [place] MD OP Progress Note  11/22/2019 2:57 PM Shari [name]  MRN:  [id number]  Chief Complaint:  Chief Complaint    Follow-up     HPI: Shari [name] is a 24 year old African-American female, employed, lives in [place], has a history of MDD, GAD, insomnia was evaluated by phone today.  Patient preferred to do a phone call.  She reports she is currently doing well with regards to her mood.  She does continue to have visual hallucinations of seeing spots when she wakes up initially from her sleep.  She reports the last time she may have had that was few days ago.  She reports it usually does not bother her much.  She denies any auditory hallucinations.  Patient is compliant on medications.  She denies side effects.  She reports she is doing well with regards to her work.  She is enjoying it.  She also has taken up a second job at [employer].  Hence work is busy.  She denies any suicidality or  homicidality.  She denies any other concerns today.  Visit Diagnosis:    ICD-10-CM   1. MDD (major depressive disorder), recurrent, in full remission ([place])  F33.42 hydrOXYzine (ATARAX/VISTARIL) 25 MG tablet  2. GAD (generalized anxiety disorder)  F41.1 hydrOXYzine (ATARAX/VISTARIL) 25 MG tablet    sertraline (ZOLOFT) 100 MG tablet  3. Insomnia due to mental condition  F51.05 traZODone (DESYREL) 100 MG tablet    Past Psychiatric History: I have reviewed past psychiatric history from my progress note on 02/12/2018.  Past trials of Wellbutrin, BuSpar  Past Medical History:  Past Medical History:  Diagnosis Date  . Anxiety   . Asthma    well controlled  . Diabetes mellitus without complication ([place])    pt states she went off her meds because her numbers were fine  . Hidradenitis suppurativa 12/31 /2019  . Pneumonia 01/2017  . Refusal of blood product     Past Surgical History:  Procedure Laterality Date  . ADENOIDECTOMY Bilateral 02/09/2018   Procedure: REVISION ADENOIDECTOMY;  Surgeon: Carloyn Manner, MD;  Location: [facility];  Service: ENT;  Laterality: Bilateral;  . EYE SURGERY    . MYRINGOTOMY WITH TUBE PLACEMENT Right 02/09/2018   Procedure: MYRINGOTOMY WITH TUBE PLACEMENT;  Surgeon: Carloyn Manner, MD;  Location: [facility];  Service: ENT;  Laterality: Right;  . NASOPHARYNGOSCOPY EUSTATION TUBE BALLOON DILATION Bilateral 02/09/2018   Procedure: NASOPHARYNGOSCOPY EUSTATION TUBE BALLOON DILATION;  Surgeon: Carloyn Manner, MD;  Location: [facility] OR[facility];  Service: ENT;  Laterality: Bilateral;  . TONSILLECTOMY    . TURBINATE REDUCTION Right 02/09/2018   Procedure: OUTFRACTURE RIGHT INFERIOR TURBINATE;  Surgeon: Carloyn Manner, MD;  Location: [facility] OR[facility];  Service: ENT;  Laterality: Right;    Family Psychiatric History: Reviewed family psychiatric history from my progress note on 02/12/2018  Family History:  Family History  Problem Relation Age of Onset  . Anxiety disorder Mother    . Depression Mother   . Alcohol abuse Father   . Drug abuse Father   . Schizophrenia Maternal Uncle     Social History: Reviewed social history from my progress note on 02/12/2018 Social History   Socioeconomic History  . Marital status: Single    Spouse name: Not on file  . Number of children: 0  . Years of education: Not on file  . Highest education level: Associate degree: occupational, Hotel manager, or vocational program  Occupational History  . Not on file  Tobacco Use  . Smoking status: Never Smoker  . Smokeless tobacco: Never Used  Vaping Use  . Vaping Use: Never used  Substance and Sexual Activity  . Alcohol use: Not Currently    Comment: rare  . Drug use: No  . Sexual activity: Yes    Partners: Male    Birth control/protection: Implant, Condom  Other Topics Concern  . Not on file  Social History Narrative  . Not on file   Social Determinants of Health   Financial Resource Strain:   . Difficulty of Paying Living Expenses: Not on file  Food Insecurity:   . Worried About Charity fundraiser in the Last Year: Not on file  . Ran Out of Food in the Last Year: Not on file  Transportation Needs:   . Lack of Transportation (Medical): Not on file  . Lack of Transportation (Non-Medical): Not on file  Physical Activity:   . Days of Exercise per Week: Not on file  . Minutes of Exercise per Session: Not on file  Stress:   . Feeling of Stress : Not on file  Social Connections:   . Frequency of Communication with Friends and Family: Not on file  . Frequency of Social Gatherings with Friends and Family: Not on file  . Attends Religious Services: Not on file  . Active Member of Clubs or Organizations: Not on file  . Attends Archivist Meetings: Not on file  . Marital Status: Not on file    Allergies:  Allergies  Allergen Reactions  . Sulfa Antibiotics Hives  . Tramadol Other (See Comments)    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    Metabolic Disorder  Labs: Lab Results  Component Value Date   HGBA1C 6.0 (H) 12/07/2015   MPG 126 12/07/2015   Lab Results  Component Value Date   PROLACTIN 14.1 12/07/2015   Lab Results  Component Value Date   CHOL 321 (H) 12/07/2015   TRIG 71 12/07/2015   HDL 41 12/07/2015   CHOLHDL 7.8 12/07/2015   VLDL 14 12/07/2015   LDLCALC 266 (H) 12/07/2015   Lab Results  Component Value Date   TSH 1.265 12/07/2015    Therapeutic Level Labs: No results found for: LITHIUM No results found for: VALPROATE No components found for:  CBMZ  Current Medications: Current Outpatient Medications  Medication Sig Dispense Refill  . Benzocaine (BOIL-EASE EX) Apply 1 application topically daily as needed (boils).    . bismuth subsalicylate (PEPTO BISMOL) 262 MG/15ML suspension Take 30 mLs by mouth every 6 (  six) hours as needed for indigestion or diarrhea or loose stools.    . brompheniramine-pseudoephedrine-DM 30-2-10 MG/5ML syrup Take 5 mLs by mouth 4 (four) times daily as needed. 120 mL 0  . doxycycline (VIBRAMYCIN) 100 MG capsule Take 100 mg by mouth 2 (two) times daily.    . Dulaglutide 0.75 MG/0.5ML SOPN Inject into the skin.    Marland Kitchen etonogestrel (NEXPLANON) 68 MG IMPL implant 68 mg by Subdermal route once.     Marland Kitchen exenatide (BYETTA 10 MCG PEN) 10 MCG/0.04ML SOPN injection Inject 10 mcg into the skin 2 (two) times daily with a meal.    . Fluticasone-Salmeterol (ADVAIR) 250-50 MCG/DOSE AEPB fluticasone 250 mcg-salmeterol 50 mcg/dose blistr powdr for inhalation    . hydrOXYzine (ATARAX/VISTARIL) 25 MG tablet Take 0.5-1 tablets (12.5-25 mg total) by mouth 2 (two) times daily as needed for anxiety. For anxiety and agitation 60 tablet 1  . ibuprofen (ADVIL) 800 MG tablet Take 800 mg by mouth 3 (three) times daily.    . Insulin Degludec (TRESIBA FLEXTOUCH) 200 UNIT/ML SOPN Inject into the skin.    Marland Kitchen JARDIANCE 25 MG TABS tablet     . ketoconazole (NIZORAL) 2 % cream Apply 1 application topically 2 (two) times daily.    Marland Kitchen  letrozole (FEMARA) 2.5 MG tablet     . metFORMIN (GLUCOPHAGE-XR) 500 MG 24 hr tablet TAKE 1 TABLET BY MOUTH TWICE DAILY FOR DIABETES.    . methylPREDNISolone (MEDROL DOSEPAK) 4 MG TBPK tablet Take Tapered dose as directed 21 tablet 0  . montelukast (SINGULAIR) 10 MG tablet TAKE 1 TABLET BY MOUTH ONCE DAILY FOR ASTHMA OR ALLERGIES.    Marland Kitchen norethindrone (AYGESTIN) 5 MG tablet Take 2.5 mg by mouth 2 (two) times daily.     Marland Kitchen omeprazole (PRILOSEC) 20 MG capsule TAKE 1 CAPSULE BY MOUTH ONCE DAILY FOR REFLUX    . PROAIR HFA 108 (90 Base) MCG/ACT inhaler Inhale 2 puffs into the lungs every 6 (six) hours as needed for wheezing or shortness of breath.     . rosuvastatin (CRESTOR) 40 MG tablet TAKE 1 TABLET BY MOUTH ONCE DAILY FOR HIGH CHOLESTEROL    . sertraline (ZOLOFT) 100 MG tablet Take 2 tablets (200 mg total) by mouth daily. 60 tablet 1  . TRADJENTA 5 MG TABS tablet     . traZODone (DESYREL) 100 MG tablet Take 1-1.5 tablets (100-150 mg total) by mouth at bedtime as needed for sleep. 45 tablet 1  . TRUEPLUS PEN NEEDLES 32G X 4 MM MISC      No current facility-administered medications for this visit.     Musculoskeletal: Strength & Muscle Tone: UTA Gait & Station: UTA Patient leans: N/A  Psychiatric Specialty Exam: Review of Systems  Psychiatric/Behavioral: Positive for hallucinations.  All other systems reviewed and are negative.   There were no vitals taken for this visit.There is no height or weight on file to calculate BMI.  General Appearance: UTA  Eye Contact:  UTA  Speech:  Clear and Coherent  Volume:  Normal  Mood:  Euthymic  Affect:  UTA  Thought Process:  Goal Directed and Descriptions of Associations: Intact  Orientation:  Full (Time, Place, and Person)  Thought Content: Hallucinations: Visual on and off - few days ago - sees spots  Suicidal Thoughts:  No  Homicidal Thoughts:  No  Memory:  Immediate;   Fair Recent;   Fair Remote;   Fair  Judgement:  Fair  Insight:  Fair   Psychomotor Activity:  UTA  Concentration:  Concentration: Fair and Attention Span: Fair  Recall:  [employer] of Knowledge: Fair  Language: Fair  Akathisia:  No  Handed:  Right  AIMS (if indicated): UTA  Assets:  Communication Skills Desire for Improvement Housing Social Support  ADL's:  Intact  Cognition: WNL  Sleep:  Fair   Screenings: AUDIT     Admission (Discharged) from 12/06/2015 in [facility]  Alcohol Use Disorder Identification Test Final Score (AUDIT) 1       Assessment and Plan: Shari [name] is a 24 year old African-American female who is unemployed, lives in [place], has a history of depression, GAD was evaluated by phone today.  Patient is currently doing well with regards to her mood.  Plan as noted below.  Plan MDD in remission Zoloft 200 mg p.o. daily Continue CBT as needed  GAD-stable Zoloft as prescribed Hydroxyzine 12.5 to 25 mg p.o. daily as needed for severe anxiety attacks.  Insomnia-stable Trazodone 100 to 200 mg p.o. nightly as needed  Follow-up in clinic in 2 months or sooner if needed.  I have spent atleast 18 minutes non face to face with patient today. More than 50 % of the time was spent for preparing to see the patient ( e.g., review of test, records ), ordering medications and test ,psychoeducation and supportive psychotherapy and care coordination,as well as documenting clinical information in electronic health record. This note was generated in part or whole with voice recognition software. Voice recognition is usually quite accurate but there are transcription errors that can and very often do occur. I apologize for any typographical errors that were not detected and corrected.       Ursula Alert, MD 11/22/2019, 2:57 PM

## 2020-01-06 ENCOUNTER — Telehealth (INDEPENDENT_AMBULATORY_CARE_PROVIDER_SITE_OTHER): Payer: BC Managed Care – PPO | Admitting: Psychiatry

## 2020-01-06 ENCOUNTER — Encounter: Payer: Self-pay | Admitting: Psychiatry

## 2020-01-06 ENCOUNTER — Other Ambulatory Visit: Payer: Self-pay

## 2020-01-06 DIAGNOSIS — F3342 Major depressive disorder, recurrent, in full remission: Secondary | ICD-10-CM | POA: Diagnosis not present

## 2020-01-06 DIAGNOSIS — F411 Generalized anxiety disorder: Secondary | ICD-10-CM | POA: Diagnosis not present

## 2020-01-06 DIAGNOSIS — F5105 Insomnia due to other mental disorder: Secondary | ICD-10-CM | POA: Diagnosis not present

## 2020-01-06 MED ORDER — TRAZODONE HCL 100 MG PO TABS: 100.0000 mg | ORAL_TABLET | Freq: Every evening | ORAL | 1 refills | Status: DC | PRN

## 2020-01-06 MED ORDER — SERTRALINE HCL 100 MG PO TABS: 200.0000 mg | ORAL_TABLET | Freq: Every day | ORAL | 1 refills | Status: DC

## 2020-01-06 MED ORDER — HYDROXYZINE HCL 25 MG PO TABS: 12.5000 mg | ORAL_TABLET | Freq: Two times a day (BID) | ORAL | 1 refills | Status: DC | PRN

## 2020-01-06 NOTE — Progress Notes (Signed)
Virtual Visit via Telephone Note  I connected with Shari [name] on 01/06/20 at  1:30 PM EST by telephone and verified that I am speaking with the correct person using two identifiers.  Location Provider Location : [facility] Patient Location : [place]  Participants: Patient , Provider   I discussed the limitations, risks, security and privacy concerns of performing an evaluation and management service by telephone and the availability of in person appointments. I also discussed with the patient that there may be a patient responsible charge related to this service. The patient expressed understanding and agreed to proceed.    I discussed the assessment and treatment plan with the patient. The patient was provided an opportunity to ask questions and all were answered. The patient agreed with the plan and demonstrated an understanding of the instructions.   The patient was advised to call back or seek an in-person evaluation if the symptoms worsen or if the condition fails to improve as anticipated.   [facility] MD OP Progress Note  01/06/2020 1:57 PM Reannon Candella  MRN:  [id number]  Chief Complaint:  Chief Complaint    Follow-up     HPI: Renita Brocks is a 24 year old African-American female, employed, lives in [place], has a history of MDD, GAD, insomnia was evaluated by phone today.  Patient today reports she is currently doing well.  Denies any significant mood symptoms.  Does report mild anxiety however reports she is able to cope with it.  Work continues to be busy.  She currently works 2 part-time jobs.  She is currently looking for a full-time job and has applied for a position with [employer].  She reports she continues to sleep well.  She however reports she does have visual hallucinations of seeing spots when she wakes up initially in the morning on and off.  It does not bother her much.  Patient denies any suicidality, homicidality or perceptual disturbances.  Patient denies any other  concerns today.  Visit Diagnosis:    ICD-10-CM   1. MDD (major depressive disorder), recurrent, in full remission ([place])  F33.42 hydrOXYzine (ATARAX/VISTARIL) 25 MG tablet  2. GAD (generalized anxiety disorder)  F41.1 sertraline (ZOLOFT) 100 MG tablet    hydrOXYzine (ATARAX/VISTARIL) 25 MG tablet  3. Insomnia due to mental condition  F51.05 traZODone (DESYREL) 100 MG tablet    Past Psychiatric History: I have reviewed past psychiatric history from my progress note on 02/12/2018.  Past trials of Wellbutrin, BuSpar  Past Medical History:  Past Medical History:  Diagnosis Date  . Anxiety   . Asthma    well controlled  . Diabetes mellitus without complication ([place])    pt states she went off her meds because her numbers were fine  . Hidradenitis suppurativa 12/31 /2019  . Pneumonia 01/2017  . Refusal of blood product     Past Surgical History:  Procedure Laterality Date  . ADENOIDECTOMY Bilateral 02/09/2018   Procedure: REVISION ADENOIDECTOMY;  Surgeon: Carloyn Manner, MD;  Location: [facility] ORS;  Service: ENT;  Laterality: Bilateral;  . EYE SURGERY    . MYRINGOTOMY WITH TUBE PLACEMENT Right 02/09/2018   Procedure: MYRINGOTOMY WITH TUBE PLACEMENT;  Surgeon: Carloyn Manner, MD;  Location: [facility];  Service: ENT;  Laterality: Right;  . NASOPHARYNGOSCOPY EUSTATION TUBE BALLOON DILATION Bilateral 02/09/2018   Procedure: NASOPHARYNGOSCOPY EUSTATION TUBE BALLOON DILATION;  Surgeon: Carloyn Manner, MD;  Location: [facility] OR[facility];  Service: ENT;  Laterality: Bilateral;  . TONSILLECTOMY    . TURBINATE REDUCTION Right 02/09/2018   Procedure:  OUTFRACTURE RIGHT INFERIOR TURBINATE;  Surgeon: Carloyn Manner, MD;  Location: [facility] OR[facility];  Service: ENT;  Laterality: Right;    Family Psychiatric History: I have reviewed family psychiatric history from my progress note on 02/12/2018  Family History:  Family History  Problem Relation Age of Onset  . Anxiety disorder Mother   . Depression Mother   .  Alcohol abuse Father   . Drug abuse Father   . Schizophrenia Maternal Uncle     Social History: Reviewed social history from my progress note on 02/12/2018 Social History   Socioeconomic History  . Marital status: Single    Spouse name: Not on file  . Number of children: 0  . Years of education: Not on file  . Highest education level: Associate degree: occupational, Hotel manager, or vocational program  Occupational History  . Not on file  Tobacco Use  . Smoking status: Never Smoker  . Smokeless tobacco: Never Used  Vaping Use  . Vaping Use: Never used  Substance and Sexual Activity  . Alcohol use: Not Currently    Comment: rare  . Drug use: No  . Sexual activity: Yes    Partners: Male    Birth control/protection: Implant, Condom  Other Topics Concern  . Not on file  Social History Narrative  . Not on file   Social Determinants of Health   Financial Resource Strain: Not on file  Food Insecurity: Not on file  Transportation Needs: Not on file  Physical Activity: Not on file  Stress: Not on file  Social Connections: Not on file    Allergies:  Allergies  Allergen Reactions  . Sulfa Antibiotics Hives  . Tramadol Other (See Comments)    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    Metabolic Disorder Labs: Lab Results  Component Value Date   HGBA1C 6.0 (H) 12/07/2015   MPG 126 12/07/2015   Lab Results  Component Value Date   PROLACTIN 14.1 12/07/2015   Lab Results  Component Value Date   CHOL 321 (H) 12/07/2015   TRIG 71 12/07/2015   HDL 41 12/07/2015   CHOLHDL 7.8 12/07/2015   VLDL 14 12/07/2015   LDLCALC 266 (H) 12/07/2015   Lab Results  Component Value Date   TSH 1.265 12/07/2015    Therapeutic Level Labs: No results found for: LITHIUM No results found for: VALPROATE No components found for:  CBMZ  Current Medications: Current Outpatient Medications  Medication Sig Dispense Refill  . fluticasone (FLONASE) 50 MCG/ACT nasal spray Place into the nose.     . gabapentin (NEURONTIN) 300 MG capsule Take by mouth.    Marland Kitchen amoxicillin-clavulanate (AUGMENTIN) 875-125 MG tablet Take 1 tablet by mouth 2 (two) times daily.    Marland Kitchen azithromycin (ZITHROMAX) 250 MG tablet Take 250 mg by mouth as directed.    . Benzocaine (BOIL-EASE EX) Apply 1 application topically daily as needed (boils).    . benzonatate (TESSALON) 200 MG capsule Take by mouth.    . bismuth subsalicylate (PEPTO BISMOL) 262 MG/15ML suspension Take 30 mLs by mouth every 6 (six) hours as needed for indigestion or diarrhea or loose stools.    . brompheniramine-pseudoephedrine-DM 30-2-10 MG/5ML syrup Take 5 mLs by mouth 4 (four) times daily as needed. 120 mL 0  . doxycycline (VIBRAMYCIN) 100 MG capsule Take 100 mg by mouth 2 (two) times daily.    . Dulaglutide 0.75 MG/0.5ML SOPN Inject into the skin.    Marland Kitchen etonogestrel (NEXPLANON) 68 MG IMPL implant 68 mg by Subdermal route  once.     . exenatide (BYETTA 10 MCG PEN) 10 MCG/0.04ML SOPN injection Inject 10 mcg into the skin 2 (two) times daily with a meal.    . fluticasone (FLONASE) 50 MCG/ACT nasal spray Place into both nostrils.    . Fluticasone-Salmeterol (ADVAIR) 250-50 MCG/DOSE AEPB fluticasone 250 mcg-salmeterol 50 mcg/dose blistr powdr for inhalation    . hydrOXYzine (ATARAX/VISTARIL) 25 MG tablet Take 0.5-1 tablets (12.5-25 mg total) by mouth 2 (two) times daily as needed for anxiety. For anxiety and agitation 60 tablet 1  . ibuprofen (ADVIL) 800 MG tablet Take 800 mg by mouth 3 (three) times daily.    . Insulin Degludec (TRESIBA FLEXTOUCH) 200 UNIT/ML SOPN Inject into the skin.    Marland Kitchen JARDIANCE 25 MG TABS tablet     . ketoconazole (NIZORAL) 2 % cream Apply 1 application topically 2 (two) times daily.    Marland Kitchen letrozole (FEMARA) 2.5 MG tablet     . metFORMIN (GLUCOPHAGE-XR) 500 MG 24 hr tablet TAKE 1 TABLET BY MOUTH TWICE DAILY FOR DIABETES.    . methylPREDNISolone (MEDROL DOSEPAK) 4 MG TBPK tablet Take Tapered dose as directed 21 tablet 0  .  montelukast (SINGULAIR) 10 MG tablet TAKE 1 TABLET BY MOUTH ONCE DAILY FOR ASTHMA OR ALLERGIES.    Marland Kitchen norethindrone (AYGESTIN) 5 MG tablet Take 2.5 mg by mouth 2 (two) times daily.     Marland Kitchen omeprazole (PRILOSEC) 20 MG capsule TAKE 1 CAPSULE BY MOUTH ONCE DAILY FOR REFLUX    . PROAIR HFA 108 (90 Base) MCG/ACT inhaler Inhale 2 puffs into the lungs every 6 (six) hours as needed for wheezing or shortness of breath.     . rosuvastatin (CRESTOR) 40 MG tablet TAKE 1 TABLET BY MOUTH ONCE DAILY FOR HIGH CHOLESTEROL    . sertraline (ZOLOFT) 100 MG tablet Take 2 tablets (200 mg total) by mouth daily. 60 tablet 1  . TRADJENTA 5 MG TABS tablet     . traZODone (DESYREL) 100 MG tablet Take 1-1.5 tablets (100-150 mg total) by mouth at bedtime as needed for sleep. 45 tablet 1  . TRUEPLUS PEN NEEDLES 32G X 4 MM MISC      No current facility-administered medications for this visit.     Musculoskeletal: Strength & Muscle Tone: UTA Gait & Station: UTA Patient leans: N/A  Psychiatric Specialty Exam: Review of Systems  Psychiatric/Behavioral: Positive for hallucinations (Chronic - does not bother her ). Negative for agitation, behavioral problems, confusion, decreased concentration, dysphoric mood, self-injury, sleep disturbance and suicidal ideas. The patient is not nervous/anxious and is not hyperactive.   All other systems reviewed and are negative.   There were no vitals taken for this visit.There is no height or weight on file to calculate BMI.  General Appearance: UTA  Eye Contact:  UTA  Speech:  Clear and Coherent  Volume:  Normal  Mood:  Euthymic  Affect:  UTA  Thought Process:  Goal Directed and Descriptions of Associations: Intact  Orientation:  Full (Time, Place, and Person)  Thought Content: Logical   Suicidal Thoughts:  No  Homicidal Thoughts:  No  Memory:  Immediate;   Fair Recent;   Fair Remote;   Fair  Judgement:  Fair  Insight:  Fair  Psychomotor Activity:  UTA  Concentration:   Concentration: Fair and Attention Span: Fair  Recall:  [employer] of Knowledge: Fair  Language: Fair  Akathisia:  No  Handed:  Right  AIMS (if indicated): UTA  Assets:  Armed forces logistics/support/administrative officer  Desire for Improvement Housing Social Support  ADL's:  Intact  Cognition: WNL  Sleep:  Fair   Screenings: AUDIT   Flowsheet Row Admission (Discharged) from 12/06/2015 in [facility]  Alcohol Use Disorder Identification Test Final Score (AUDIT) 1       Assessment and Plan: Melissaann Dizdarevic is a 24 year old African-American female who is employed, lives in [place], has a history of depression, GAD was evaluated by phone today.  Patient is currently stable on current medication regimen.  Plan as noted below.  Plan MDD in remission Zoloft 200 mg p.o. daily Continue CBT as needed  GAD-stable Zoloft as prescribed Hydroxyzine 12.5 to 25 mg p.o. daily as needed for severe anxiety attacks  Insomnia-stable Trazodone 100 to 200 mg p.o. nightly as needed  Follow-up in clinic in 1 to 2 months or sooner if needed.  I have spent atleast 20 minutes non face to face  with patient today. More than 50 % of the time was spent for preparing to see the patient ( e.g., review of test, records ),  ordering medications and test ,psychoeducation and supportive psychotherapy and care coordination,as well as documenting clinical information in electronic health record. This note was generated in part or whole with voice recognition software. Voice recognition is usually quite accurate but there are transcription errors that can and very often do occur. I apologize for any typographical errors that were not detected and corrected.        Ursula Alert, MD 01/06/2020, 1:57 PM

## 2020-01-11 IMAGING — CT CT RENAL STONE PROTOCOL
2 of 4 series · 16 of 46 positions shown, 18 images · non-contrast
Comparison: Prior MRI from 05/07/2017.

CLINICAL DATA: Initial evaluation for acute left flank pain.

EXAM:
CT ABDOMEN AND PELVIS WITHOUT CONTRAST
TECHNIQUE: Multidetector CT imaging of the abdomen and pelvis was performed
following the standard protocol without IV contrast.

[Series 2: stone full standard · axial · 0.79mm/px · z∈[-835,-400]mm · 13 of 95 slices shown, 15 images]
[im 4/95  soft-tissue]
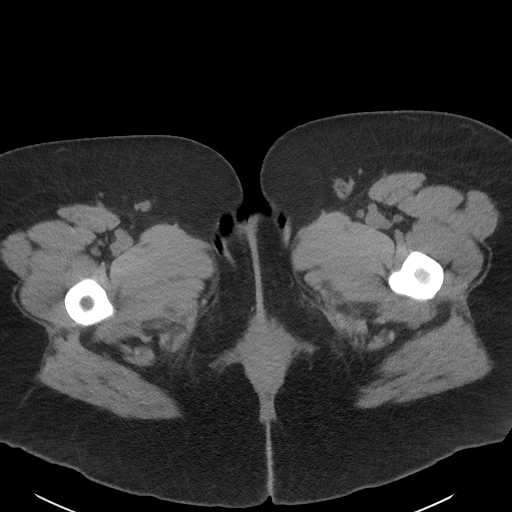
[im 4/95  bone]
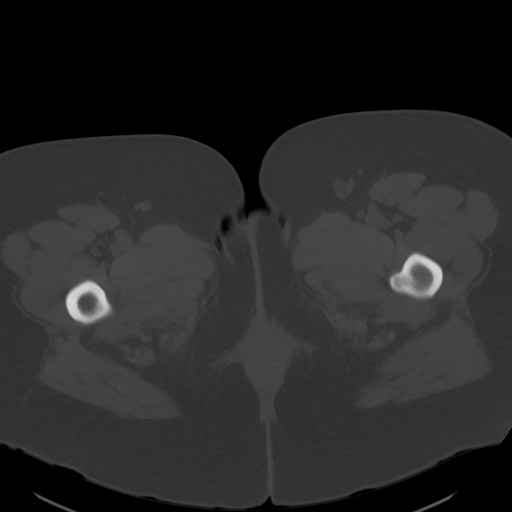
[im 12/95  soft-tissue]
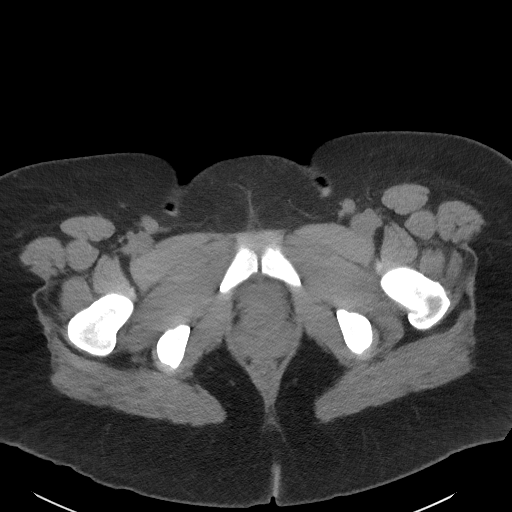
[im 20/95  soft-tissue]
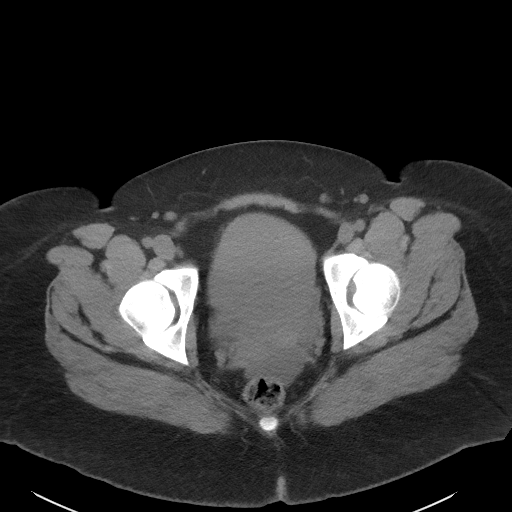
[im 28/95  soft-tissue]
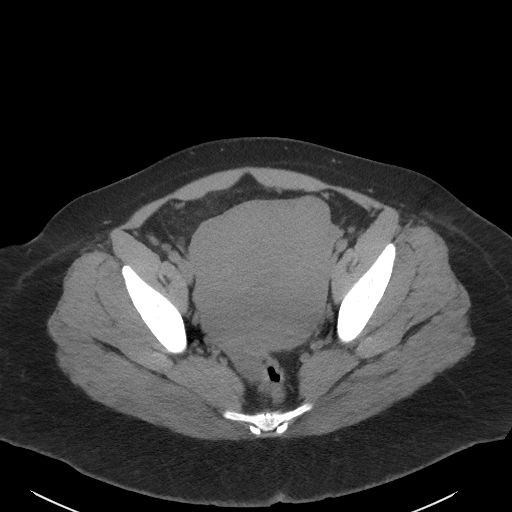
[im 32/95  soft-tissue]
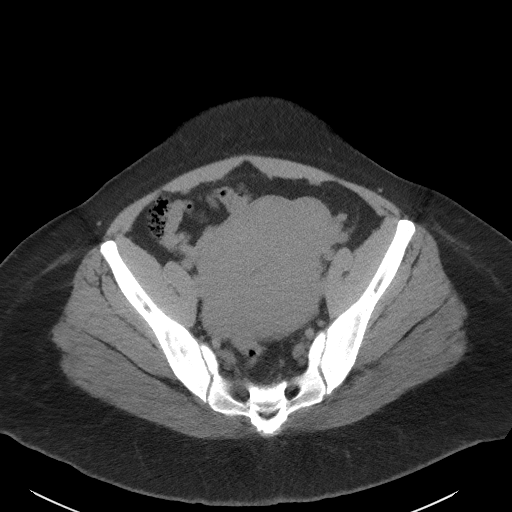
[im 40/95  soft-tissue]
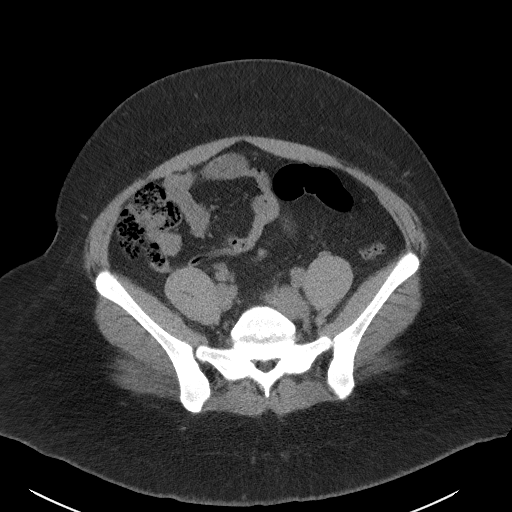
[im 48/95  soft-tissue]
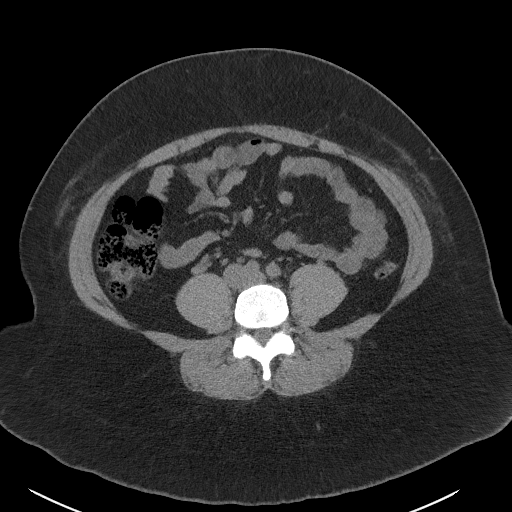
[im 55/95  soft-tissue]
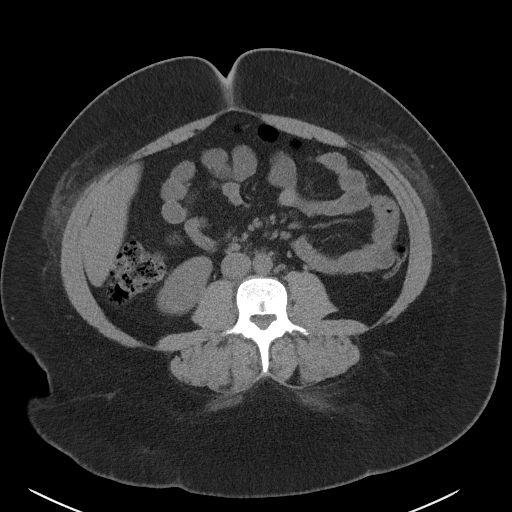
[im 63/95  soft-tissue]
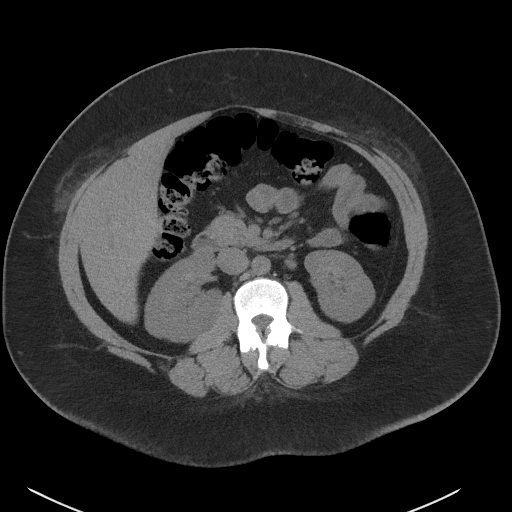
[im 63/95  bone]
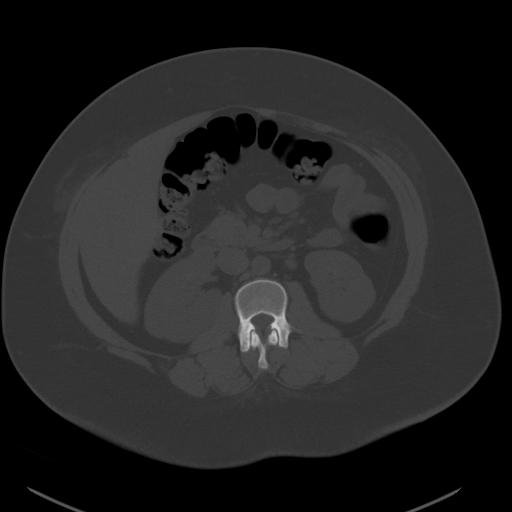
[im 67/95  soft-tissue]
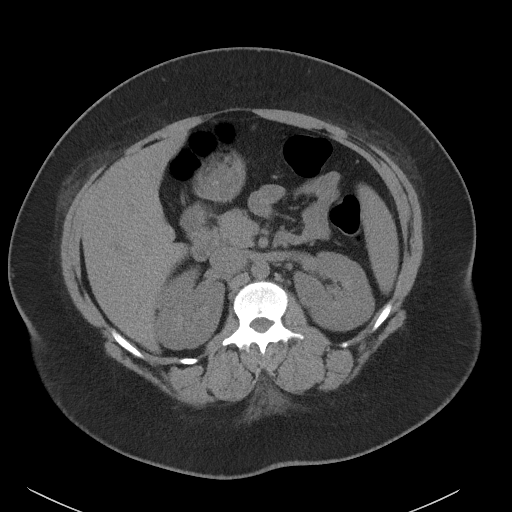
[im 75/95  soft-tissue]
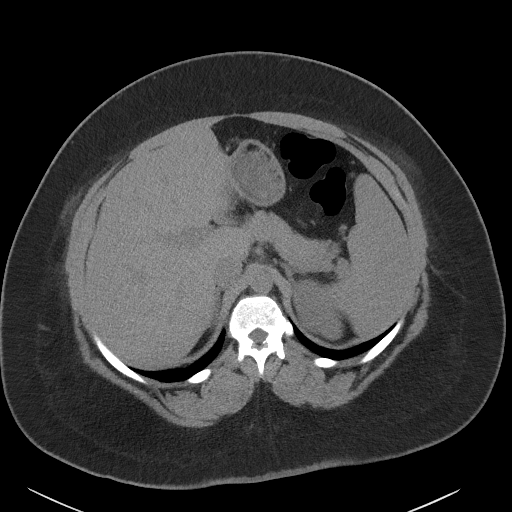
[im 83/95  soft-tissue]
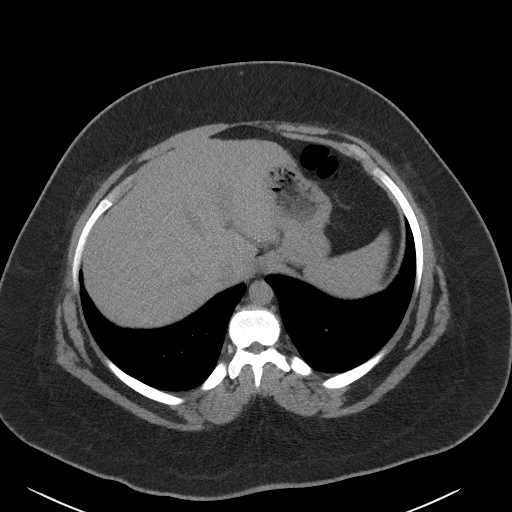
[im 91/95  soft-tissue]
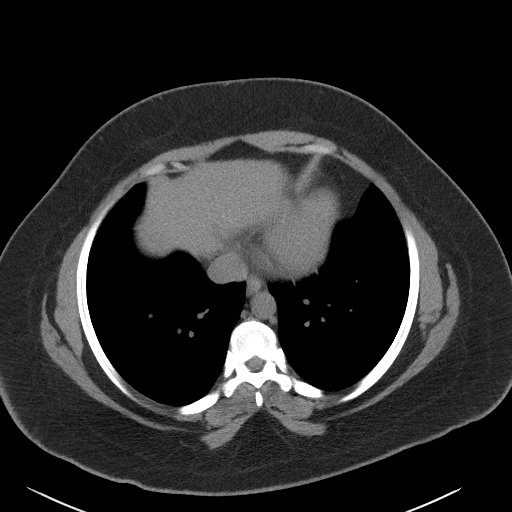

[Series 5: coronal · coronal · 0.77mm/px · 3 of 166 slices shown]
[im 56/166  soft-tissue]
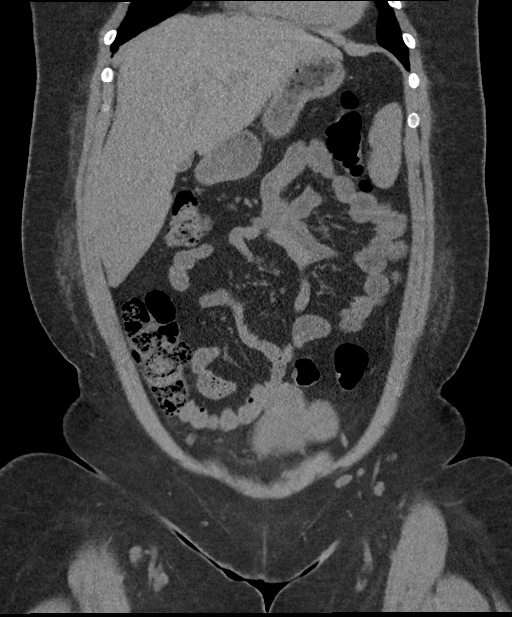
[im 74/166  soft-tissue]
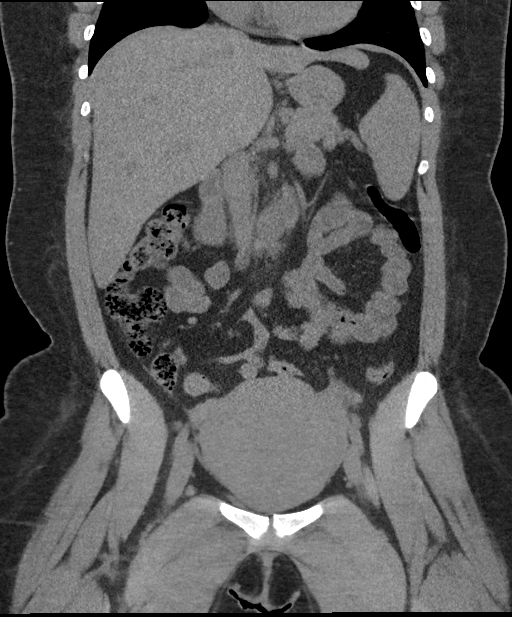
[im 92/166  soft-tissue]
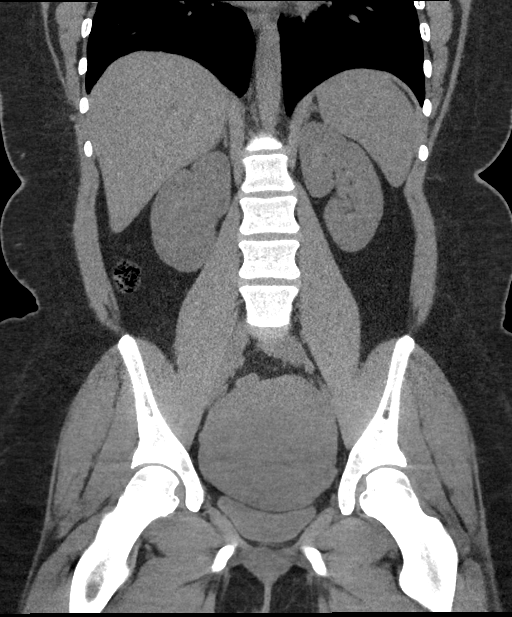

[16 of 46 positions shown; findings below may reference images not displayed]

FINDINGS: Lower chest: Visualized lung bases are clear.

Hepatobiliary: Liver demonstrates a normal unenhanced appearance.
Gallbladder contracted without acute abnormality. No biliary
dilatation.

Pancreas: Pancreas within normal limits.

Spleen: Unremarkable.

Adrenals/Urinary Tract: Adrenal glands are normal. Kidneys equal in
size without evidence for nephrolithiasis or hydronephrosis. No
radiopaque calculi seen along the course of either renal collecting
system. No hydroureter. Bladder largely decompressed without acute
abnormality. No layering stones within the bladder lumen.

Stomach/Bowel: Stomach within normal limits. No evidence for bowel
obstruction. Normal appendix. No acute inflammatory changes seen
about the bowels.

Vascular/Lymphatic: Intra-abdominal aorta of normal caliber. No
adenopathy.

Reproductive: Enlarged uterus with innumerable fibroids, not well
delineated on this noncontrast examination. No appreciable adnexal
mass.

Other: Small volume free fluid within the pelvic cul-de-sac, likely
physiologic. No free intraperitoneal air.

Musculoskeletal: No acute osseus abnormality. No worrisome lytic or
blastic osseous lesions.
IMPRESSION: 1. No CT evidence for nephrolithiasis or obstructive uropathy.
2. Small volume free fluid within the pelvis, presumably
physiologic.
3. Enlarged fibroid uterus.
4. No other acute abnormality within the abdomen and pelvis.

## 2020-01-12 DIAGNOSIS — IMO0002 Reserved for concepts with insufficient information to code with codable children: Secondary | ICD-10-CM | POA: Insufficient documentation

## 2020-01-12 DIAGNOSIS — E1169 Type 2 diabetes mellitus with other specified complication: Secondary | ICD-10-CM | POA: Insufficient documentation

## 2020-01-12 DIAGNOSIS — Z91148 Patient's other noncompliance with medication regimen for other reason: Secondary | ICD-10-CM | POA: Insufficient documentation

## 2020-01-12 DIAGNOSIS — E1165 Type 2 diabetes mellitus with hyperglycemia: Secondary | ICD-10-CM | POA: Insufficient documentation

## 2020-01-12 DIAGNOSIS — Z9114 Patient's other noncompliance with medication regimen: Secondary | ICD-10-CM | POA: Insufficient documentation

## 2020-01-13 DIAGNOSIS — D75839 Thrombocytosis, unspecified: Secondary | ICD-10-CM | POA: Insufficient documentation

## 2020-02-24 ENCOUNTER — Other Ambulatory Visit: Payer: Self-pay

## 2020-02-24 ENCOUNTER — Encounter: Payer: Self-pay | Admitting: Psychiatry

## 2020-02-24 ENCOUNTER — Telehealth (INDEPENDENT_AMBULATORY_CARE_PROVIDER_SITE_OTHER): Payer: BC Managed Care – PPO | Admitting: Psychiatry

## 2020-02-24 DIAGNOSIS — F411 Generalized anxiety disorder: Secondary | ICD-10-CM

## 2020-02-24 DIAGNOSIS — F5105 Insomnia due to other mental disorder: Secondary | ICD-10-CM

## 2020-02-24 DIAGNOSIS — F3342 Major depressive disorder, recurrent, in full remission: Secondary | ICD-10-CM

## 2020-02-24 MED ORDER — SERTRALINE HCL 100 MG PO TABS
200.0000 mg | ORAL_TABLET | Freq: Every day | ORAL | 1 refills | Status: DC
Start: 2020-02-24 — End: 2020-05-01

## 2020-02-24 MED ORDER — TRAZODONE HCL 100 MG PO TABS: 100.0000 mg | ORAL_TABLET | Freq: Every evening | ORAL | 1 refills | Status: DC | PRN

## 2020-02-24 NOTE — Progress Notes (Signed)
Virtual Visit via Telephone Note  I connected with Shari [name] on 02/24/20 at  3:20 PM EST by telephone and verified that I am speaking with the correct person using two identifiers.  Location Provider Location : [facility] Patient Location : Home  Participants: Patient , Provider    I discussed the limitations, risks, security and privacy concerns of performing an evaluation and management service by telephone and the availability of in person appointments. I also discussed with the patient that there may be a patient responsible charge related to this service. The patient expressed understanding and agreed to proceed.   I discussed the assessment and treatment plan with the patient. The patient was provided an opportunity to ask questions and all were answered. The patient agreed with the plan and demonstrated an understanding of the instructions.   The patient was advised to call back or seek an in-person evaluation if the symptoms worsen or if the condition fails to improve as anticipated.   [facility] MD OP Progress Note  02/24/2020 5:50 PM Shari [name]  MRN:  [id number]  Chief Complaint:  Chief Complaint    Follow-up     HPI: Shari [name] is a 25 year old African-American female, employed, lives in [place], has a history of MDD, GAD, insomnia was evaluated by telemedicine today.  Patient today reports she is currently doing well.  Denies any mood symptoms.  Patient denies any suicidality, homicidality or perceptual disturbances.  She reports work is going well.  Patient reports she is currently compliant on medications.  Denies any side effects.  Patient denies any other concerns today.  Visit Diagnosis:    ICD-10-CM   1. MDD (major depressive disorder), recurrent, in full remission ([place])  F33.42   2. GAD (generalized anxiety disorder)  F41.1 sertraline (ZOLOFT) 100 MG tablet  3. Insomnia due to mental condition  F51.05 traZODone (DESYREL) 100 MG tablet    Past  Psychiatric History: I have reviewed past psychiatric history from my progress note on 02/12/2018.  Past trials of Wellbutrin, BuSpar  Past Medical History:  Past Medical History:  Diagnosis Date  . Anxiety   . Asthma    well controlled  . Diabetes mellitus without complication ([place])    pt states she went off her meds because her numbers were fine  . Hidradenitis suppurativa 12/31 /2019  . Pneumonia 01/2017  . Refusal of blood product     Past Surgical History:  Procedure Laterality Date  . ADENOIDECTOMY Bilateral 02/09/2018   Procedure: REVISION ADENOIDECTOMY;  Surgeon: Carloyn Manner, MD;  Location: [facility] ORS;  Service: ENT;  Laterality: Bilateral;  . EYE SURGERY    . MYRINGOTOMY WITH TUBE PLACEMENT Right 02/09/2018   Procedure: MYRINGOTOMY WITH TUBE PLACEMENT;  Surgeon: Carloyn Manner, MD;  Location: [facility] ORS;  Service: ENT;  Laterality: Right;  . NASOPHARYNGOSCOPY EUSTATION TUBE BALLOON DILATION Bilateral 02/09/2018   Procedure: NASOPHARYNGOSCOPY EUSTATION TUBE BALLOON DILATION;  Surgeon: Carloyn Manner, MD;  Location: [facility] ORS;  Service: ENT;  Laterality: Bilateral;  . TONSILLECTOMY    . TURBINATE REDUCTION Right 02/09/2018   Procedure: OUTFRACTURE RIGHT INFERIOR TURBINATE;  Surgeon: Carloyn Manner, MD;  Location: [facility] ORS;  Service: ENT;  Laterality: Right;    Family Psychiatric History: I have reviewed family psychiatric history from my progress note on 02/12/2018  Family History:  Family History  Problem Relation Age of Onset  . Anxiety disorder Mother   . Depression Mother   . Alcohol abuse Father   . Drug abuse Father   .  Schizophrenia Maternal Uncle     Social History: Reviewed social history from my progress note on 02/12/2018 Social History   Socioeconomic History  . Marital status: Single    Spouse name: Not on file  . Number of children: 0  . Years of education: Not on file  . Highest education level: Associate degree: occupational, Hotel manager, or  vocational program  Occupational History  . Not on file  Tobacco Use  . Smoking status: Never Smoker  . Smokeless tobacco: Never Used  Vaping Use  . Vaping Use: Never used  Substance and Sexual Activity  . Alcohol use: Not Currently    Comment: rare  . Drug use: No  . Sexual activity: Yes    Partners: Male    Birth control/protection: Implant, Condom  Other Topics Concern  . Not on file  Social History Narrative  . Not on file   Social Determinants of Health   Financial Resource Strain: Not on file  Food Insecurity: Not on file  Transportation Needs: Not on file  Physical Activity: Not on file  Stress: Not on file  Social Connections: Not on file    Allergies:  Allergies  Allergen Reactions  . Sulfa Antibiotics Hives  . Tramadol Other (See Comments)    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    Metabolic Disorder Labs: Lab Results  Component Value Date   HGBA1C 6.0 (H) 12/07/2015   MPG 126 12/07/2015   Lab Results  Component Value Date   PROLACTIN 14.1 12/07/2015   Lab Results  Component Value Date   CHOL 321 (H) 12/07/2015   TRIG 71 12/07/2015   HDL 41 12/07/2015   CHOLHDL 7.8 12/07/2015   VLDL 14 12/07/2015   LDLCALC 266 (H) 12/07/2015   Lab Results  Component Value Date   TSH 1.265 12/07/2015    Therapeutic Level Labs: No results found for: LITHIUM No results found for: VALPROATE No components found for:  CBMZ  Current Medications: Current Outpatient Medications  Medication Sig Dispense Refill  . hydrocortisone (ANUSOL-HC) 25 MG suppository Place rectally.    Marland Kitchen amoxicillin-clavulanate (AUGMENTIN) 875-125 MG tablet Take 1 tablet by mouth 2 (two) times daily.    Marland Kitchen azithromycin (ZITHROMAX) 250 MG tablet Take 250 mg by mouth as directed.    . Benzocaine (BOIL-EASE EX) Apply 1 application topically daily as needed (boils).    . benzonatate (TESSALON) 200 MG capsule Take by mouth.    . bismuth subsalicylate (PEPTO BISMOL) 262 MG/15ML suspension Take  30 mLs by mouth every 6 (six) hours as needed for indigestion or diarrhea or loose stools.    . brompheniramine-pseudoephedrine-DM 30-2-10 MG/5ML syrup Take 5 mLs by mouth 4 (four) times daily as needed. 120 mL 0  . doxycycline (VIBRAMYCIN) 100 MG capsule Take 100 mg by mouth 2 (two) times daily.    . Dulaglutide 0.75 MG/0.5ML SOPN Inject into the skin.    Marland Kitchen etonogestrel (NEXPLANON) 68 MG IMPL implant 68 mg by Subdermal route once.     Marland Kitchen exenatide (BYETTA 10 MCG PEN) 10 MCG/0.04ML SOPN injection Inject 10 mcg into the skin 2 (two) times daily with a meal.    . FEROSUL 325 (65 Fe) MG tablet Take 325 mg by mouth daily.    . fluticasone (FLONASE) 50 MCG/ACT nasal spray Place into the nose.    . fluticasone (FLONASE) 50 MCG/ACT nasal spray Place into both nostrils.    . Fluticasone-Salmeterol (ADVAIR) 250-50 MCG/DOSE AEPB fluticasone 250 mcg-salmeterol 50 mcg/dose blistr powdr for  inhalation    . gabapentin (NEURONTIN) 300 MG capsule Take by mouth.    . hydrOXYzine (ATARAX/VISTARIL) 25 MG tablet Take 0.5-1 tablets (12.5-25 mg total) by mouth 2 (two) times daily as needed for anxiety. For anxiety and agitation 60 tablet 1  . ibuprofen (ADVIL) 800 MG tablet Take 800 mg by mouth 3 (three) times daily.    . Insulin Degludec (TRESIBA FLEXTOUCH) 200 UNIT/ML SOPN Inject into the skin.    Marland Kitchen JARDIANCE 25 MG TABS tablet     . ketoconazole (NIZORAL) 2 % cream Apply 1 application topically 2 (two) times daily.    Marland Kitchen letrozole (FEMARA) 2.5 MG tablet     . metFORMIN (GLUCOPHAGE-XR) 500 MG 24 hr tablet TAKE 1 TABLET BY MOUTH TWICE DAILY FOR DIABETES.    . methylPREDNISolone (MEDROL DOSEPAK) 4 MG TBPK tablet Take Tapered dose as directed 21 tablet 0  . montelukast (SINGULAIR) 10 MG tablet TAKE 1 TABLET BY MOUTH ONCE DAILY FOR ASTHMA OR ALLERGIES.    Marland Kitchen norethindrone (AYGESTIN) 5 MG tablet Take 2.5 mg by mouth 2 (two) times daily.     Marland Kitchen omeprazole (PRILOSEC) 20 MG capsule TAKE 1 CAPSULE BY MOUTH ONCE DAILY FOR REFLUX     . OZEMPIC, 0.25 OR 0.5 MG/DOSE, 2 MG/1.5ML SOPN Inject into the skin.    Marland Kitchen PROAIR HFA 108 (90 Base) MCG/ACT inhaler Inhale 2 puffs into the lungs every 6 (six) hours as needed for wheezing or shortness of breath.     . rosuvastatin (CRESTOR) 40 MG tablet TAKE 1 TABLET BY MOUTH ONCE DAILY FOR HIGH CHOLESTEROL    . sertraline (ZOLOFT) 100 MG tablet Take 2 tablets (200 mg total) by mouth daily. 60 tablet 1  . TRADJENTA 5 MG TABS tablet     . traZODone (DESYREL) 100 MG tablet Take 1-1.5 tablets (100-150 mg total) by mouth at bedtime as needed for sleep. 45 tablet 1  . TRUEPLUS PEN NEEDLES 32G X 4 MM MISC      No current facility-administered medications for this visit.     Musculoskeletal: Strength & Muscle Tone: UTA Gait & Station: UTA Patient leans: N/A  Psychiatric Specialty Exam: Review of Systems  Psychiatric/Behavioral: Negative for agitation, behavioral problems, confusion, decreased concentration, dysphoric mood, hallucinations, self-injury, sleep disturbance and suicidal ideas. The patient is not nervous/anxious and is not hyperactive.   All other systems reviewed and are negative.   There were no vitals taken for this visit.There is no height or weight on file to calculate BMI.  General Appearance: UTA  Eye Contact:  UTA  Speech:  Clear and Coherent  Volume:  Normal  Mood:  Euthymic  Affect:  UTA  Thought Process:  Goal Directed and Descriptions of Associations: Intact  Orientation:  Full (Time, Place, and Person)  Thought Content: Logical   Suicidal Thoughts:  No  Homicidal Thoughts:  No  Memory:  Immediate;   Fair Recent;   Fair Remote;   Fair  Judgement:  Fair  Insight:  Fair  Psychomotor Activity:  UTA  Concentration:  Concentration: Fair and Attention Span: Fair  Recall:  [employer] of Knowledge: Fair  Language: Fair  Akathisia:  No  Handed:  Right  AIMS (if indicated): UTA  Assets:  Communication Skills Desire for [place] Talents/Skills Transportation  ADL's:  Intact  Cognition: WNL  Sleep:  Fair   Screenings: AUDIT   Flowsheet Row Admission (Discharged) from 12/06/2015 in [place]  Alcohol Use Disorder  Identification Test Final Score (AUDIT) 1       Assessment and Plan: Shari [name] is a 25 year old African-American female who is employed, lives in [place], has a history of depression, GAD was evaluated by telemedicine today.  Patient is currently stable on current medication regimen.  Plan as noted below.  Plan MDD in remission Zoloft 200 mg p.o. daily Continue CBT as needed  GAD-stable Zoloft as prescribed Hydroxyzine 12.5-25 mg p.o. daily as needed for severe anxiety attacks.  Insomnia-stable Trazodone 100 to 200 mg p.o. nightly as needed  Follow-up in clinic in 2-3 months or sooner if needed.  I have spent atleast 15 minutes non face to face  with patient today. More than 50 % of the time was spent for preparing to see the patient ( e.g., review of test, records ),  ordering medications and test ,psychoeducation and supportive psychotherapy and care coordination,as well as documenting clinical information in electronic health record. This note was generated in part or whole with voice recognition software. Voice recognition is usually quite accurate but there are transcription errors that can and very often do occur. I apologize for any typographical errors that were not detected and corrected.        Ursula Alert, MD 02/24/2020, 5:50 PM

## 2020-03-02 DIAGNOSIS — Z0289 Encounter for other administrative examinations: Secondary | ICD-10-CM

## 2020-03-20 ENCOUNTER — Encounter: Payer: Self-pay | Admitting: Emergency Medicine

## 2020-03-20 ENCOUNTER — Emergency Department
Admission: EM | Admit: 2020-03-20 | Discharge: 2020-03-20 | Disposition: A | Discharge status: Home / Self Care | Payer: BC Managed Care – PPO | Attending: Emergency Medicine | Admitting: Emergency Medicine

## 2020-03-20 DIAGNOSIS — Z5321 Procedure and treatment not carried out due to patient leaving prior to being seen by health care provider: Secondary | ICD-10-CM | POA: Insufficient documentation

## 2020-03-20 DIAGNOSIS — R1030 Lower abdominal pain, unspecified: Secondary | ICD-10-CM | POA: Diagnosis present

## 2020-03-20 HISTORY — DX: Endometriosis, unspecified: N80.9

## 2020-03-20 LAB — COMPREHENSIVE METABOLIC PANEL
ALT: 16 U/L (ref 0–44)
AST: 17 U/L (ref 15–41)
Albumin: 4.1 g/dL (ref 3.5–5.0)
Alkaline Phosphatase: 77 U/L (ref 38–126)
Anion gap: 11 (ref 5–15)
BUN: 11 mg/dL (ref 6–20)
CO2: 21 mmol/L — ABNORMAL LOW (ref 22–32)
Calcium: 9 mg/dL (ref 8.9–10.3)
Chloride: 102 mmol/L (ref 98–111)
Creatinine, Ser: 0.74 mg/dL (ref 0.44–1.00)
GFR, Estimated: >60 mL/min (ref >60)
Glucose, Bld: 192 mg/dL — ABNORMAL HIGH (ref 70–99)
Potassium: 3.6 mmol/L (ref 3.5–5.1)
Sodium: 134 mmol/L — ABNORMAL LOW (ref 135–145)
Total Bilirubin: 0.4 mg/dL (ref 0.3–1.2)
Total Protein: 8.8 g/dL — ABNORMAL HIGH (ref 6.5–8.1)

## 2020-03-20 LAB — CBC
HCT: 38.9 % (ref 36.0–46.0)
Hemoglobin: 11.9 g/dL — ABNORMAL LOW (ref 12.0–15.0)
MCH: 24.7 pg — ABNORMAL LOW (ref 26.0–34.0)
MCHC: 30.6 g/dL (ref 30.0–36.0)
MCV: 80.9 fL (ref 80.0–100.0)
Platelets: 474 10*3/uL — ABNORMAL HIGH (ref 150–400)
RBC: 4.81 MIL/uL (ref 3.87–5.11)
RDW: 16.1 % — ABNORMAL HIGH (ref 11.5–15.5)
WBC: 15.7 10*3/uL — ABNORMAL HIGH (ref 4.0–10.5)
nRBC: 0 % (ref 0.0–0.2)

## 2020-03-20 LAB — LIPASE, BLOOD: Lipase: 30 U/L (ref 11–51)

## 2020-03-20 LAB — POC URINE PREG, ED: Preg Test, Ur: NEGATIVE

## 2020-03-20 NOTE — ED Triage Notes (Signed)
Pt arrived via EMS from work where she has had intermittent episodes of lower abdominal pain and then 1 episode of "all over" body burning sensation while having BM. Pt denies pain on arrival to ED.

## 2020-05-01 ENCOUNTER — Other Ambulatory Visit: Payer: Self-pay

## 2020-05-01 ENCOUNTER — Encounter: Payer: Self-pay | Admitting: Psychiatry

## 2020-05-01 ENCOUNTER — Telehealth (INDEPENDENT_AMBULATORY_CARE_PROVIDER_SITE_OTHER): Payer: BC Managed Care – PPO | Admitting: Psychiatry

## 2020-05-01 DIAGNOSIS — F5105 Insomnia due to other mental disorder: Secondary | ICD-10-CM | POA: Diagnosis not present

## 2020-05-01 DIAGNOSIS — F411 Generalized anxiety disorder: Secondary | ICD-10-CM

## 2020-05-01 DIAGNOSIS — F3342 Major depressive disorder, recurrent, in full remission: Secondary | ICD-10-CM

## 2020-05-01 MED ORDER — SERTRALINE HCL 100 MG PO TABS: 200.0000 mg | ORAL_TABLET | Freq: Every day | ORAL | 2 refills | Status: DC

## 2020-05-01 NOTE — Progress Notes (Signed)
Virtual Visit via Video Note  I connected with Shari [name] on 05/01/20 at  2:00 PM EDT by a video enabled telemedicine application and verified that I am speaking with the correct person using two identifiers.  Location Provider Location : [facility] Patient Location : Work  Participants: Patient , Provider   I discussed the limitations of evaluation and management by telemedicine and the availability of in person appointments. The patient expressed understanding and agreed to proceed.    I discussed the assessment and treatment plan with the patient. The patient was provided an opportunity to ask questions and all were answered. The patient agreed with the plan and demonstrated an understanding of the instructions.   The patient was advised to call back or seek an in-person evaluation if the symptoms worsen or if the condition fails to improve as anticipated.   [facility] MD OP Progress Note  05/01/2020 5:07 PM Jalie Eiland  MRN:  [id number]  Chief Complaint:  Chief Complaint    Follow-up; Depression     HPI: Shari [name] is a 25 year old African-American female, employed, lives in [place], has a history of MDD, GAD, insomnia was evaluated by telemedicine today.  Patient today reports she is currently employed at this new company called [employer] , which is a [employer].  She works as a Engineer, petroleum at [employer].  So far it is going well.  Patient reports she has not had any significant anxiety or depressive symptoms lately.  She denies any hallucinations.  She reports sleep is overall okay.  She has not needed the trazodone at all.  Patient denies any suicidality or homicidality.  Patient is compliant on the sertraline.  Denies side effects.  Patient denies any other concerns today.  Visit Diagnosis:    ICD-10-CM   1. MDD (major depressive disorder), recurrent, in full remission ([place])  F33.42   2. GAD (generalized anxiety disorder)  F41.1 sertraline (ZOLOFT) 100 MG  tablet  3. Insomnia due to mental condition  F51.05     Past Psychiatric History: I have reviewed past psychiatric history from my progress note on 02/12/2018.  Past trials of Wellbutrin, BuSpar  Past Medical History:  Past Medical History:  Diagnosis Date  . Anxiety   . Asthma    well controlled  . Diabetes mellitus without complication ([place])    pt states she went off her meds because her numbers were fine  . Endometriosis   . Hidradenitis suppurativa 12/31 /2019  . Pneumonia 01/2017  . Refusal of blood product     Past Surgical History:  Procedure Laterality Date  . ADENOIDECTOMY Bilateral 02/09/2018   Procedure: REVISION ADENOIDECTOMY;  Surgeon: Carloyn Manner, MD;  Location: [facility] ORS;  Service: ENT;  Laterality: Bilateral;  . EYE SURGERY    . MYRINGOTOMY WITH TUBE PLACEMENT Right 02/09/2018   Procedure: MYRINGOTOMY WITH TUBE PLACEMENT;  Surgeon: Carloyn Manner, MD;  Location: [facility] ORS;  Service: ENT;  Laterality: Right;  . NASOPHARYNGOSCOPY EUSTATION TUBE BALLOON DILATION Bilateral 02/09/2018   Procedure: NASOPHARYNGOSCOPY EUSTATION TUBE BALLOON DILATION;  Surgeon: Carloyn Manner, MD;  Location: [facility] OR[facility];  Service: ENT;  Laterality: Bilateral;  . TONSILLECTOMY    . TURBINATE REDUCTION Right 02/09/2018   Procedure: OUTFRACTURE RIGHT INFERIOR TURBINATE;  Surgeon: Carloyn Manner, MD;  Location: [facility] OR[facility];  Service: ENT;  Laterality: Right;    Family Psychiatric History: I have reviewed family psychiatric history from my progress note on 02/12/2018.  Family History:  Family History  Problem Relation Age of  Onset  . Anxiety disorder Mother   . Depression Mother   . Alcohol abuse Father   . Drug abuse Father   . Schizophrenia Maternal Uncle     Social History: I have reviewed social history from my progress note on 02/12/2018. Social History   Socioeconomic History  . Marital status: Single    Spouse name: Not on file  . Number of children: 0  . Years of  education: Not on file  . Highest education level: Associate degree: occupational, Hotel manager, or vocational program  Occupational History  . Not on file  Tobacco Use  . Smoking status: Never Smoker  . Smokeless tobacco: Never Used  Vaping Use  . Vaping Use: Never used  Substance and Sexual Activity  . Alcohol use: Not Currently    Comment: rare  . Drug use: No  . Sexual activity: Yes    Partners: Male    Birth control/protection: Implant, Condom  Other Topics Concern  . Not on file  Social History Narrative  . Not on file   Social Determinants of Health   Financial Resource Strain: Not on file  Food Insecurity: Not on file  Transportation Needs: Not on file  Physical Activity: Not on file  Stress: Not on file  Social Connections: Not on file    Allergies:  Allergies  Allergen Reactions  . Sulfa Antibiotics Hives  . Tramadol Other (See Comments)    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    Metabolic Disorder Labs: Lab Results  Component Value Date   HGBA1C 6.0 (H) 12/07/2015   MPG 126 12/07/2015   Lab Results  Component Value Date   PROLACTIN 14.1 12/07/2015   Lab Results  Component Value Date   CHOL 321 (H) 12/07/2015   TRIG 71 12/07/2015   HDL 41 12/07/2015   CHOLHDL 7.8 12/07/2015   VLDL 14 12/07/2015   LDLCALC 266 (H) 12/07/2015   Lab Results  Component Value Date   TSH 1.265 12/07/2015    Therapeutic Level Labs: No results found for: LITHIUM No results found for: VALPROATE No components found for:  CBMZ  Current Medications: Current Outpatient Medications  Medication Sig Dispense Refill  . Blood Glucose Monitoring Suppl (FIFTY50 GLUCOSE METER 2.0) w/Device KIT Use as instructed. Ok to dispense whichever glucometer is covered by insurance E11.65    . glucose blood (PRECISION QID TEST) test strip Dispense 100 blood glucose test strips, ok to sub any brand preferred by insurance/patient to match with meter, use 3x/day, dx E11.65    .  amoxicillin-clavulanate (AUGMENTIN) 875-125 MG tablet Take 1 tablet by mouth 2 (two) times daily.    Marland Kitchen azithromycin (ZITHROMAX) 250 MG tablet Take 250 mg by mouth as directed.    . Benzocaine (BOIL-EASE EX) Apply 1 application topically daily as needed (boils).    . benzonatate (TESSALON) 200 MG capsule Take by mouth.    . bismuth subsalicylate (PEPTO BISMOL) 262 MG/15ML suspension Take 30 mLs by mouth every 6 (six) hours as needed for indigestion or diarrhea or loose stools.    . brompheniramine-pseudoephedrine-DM 30-2-10 MG/5ML syrup Take 5 mLs by mouth 4 (four) times daily as needed. 120 mL 0  . doxycycline (VIBRAMYCIN) 100 MG capsule Take 100 mg by mouth 2 (two) times daily.    . Dulaglutide 0.75 MG/0.5ML SOPN Inject into the skin.    Marland Kitchen etonogestrel (NEXPLANON) 68 MG IMPL implant 68 mg by Subdermal route once.     Marland Kitchen exenatide (BYETTA 10 MCG PEN) 10  MCG/0.04ML SOPN injection Inject 10 mcg into the skin 2 (two) times daily with a meal.    . FEROSUL 325 (65 Fe) MG tablet Take 325 mg by mouth daily.    . fluconazole (DIFLUCAN) 150 MG tablet Take 150 mg by mouth once.    . fluticasone (FLONASE) 50 MCG/ACT nasal spray Place into the nose.    . fluticasone (FLONASE) 50 MCG/ACT nasal spray Place into both nostrils.    . Fluticasone-Salmeterol (ADVAIR) 250-50 MCG/DOSE AEPB fluticasone 250 mcg-salmeterol 50 mcg/dose blistr powdr for inhalation    . gabapentin (NEURONTIN) 300 MG capsule Take by mouth.    . hydrocortisone (ANUSOL-HC) 25 MG suppository Place rectally.    . hydrOXYzine (ATARAX/VISTARIL) 25 MG tablet Take 0.5-1 tablets (12.5-25 mg total) by mouth 2 (two) times daily as needed for anxiety. For anxiety and agitation 60 tablet 1  . ibuprofen (ADVIL) 800 MG tablet Take 800 mg by mouth 3 (three) times daily.    . Insulin Degludec (TRESIBA FLEXTOUCH) 200 UNIT/ML SOPN Inject into the skin.    Marland Kitchen JARDIANCE 25 MG TABS tablet     . ketoconazole (NIZORAL) 2 % cream Apply 1 application topically 2  (two) times daily.    Marland Kitchen letrozole (FEMARA) 2.5 MG tablet     . metFORMIN (GLUCOPHAGE-XR) 500 MG 24 hr tablet TAKE 1 TABLET BY MOUTH TWICE DAILY FOR DIABETES.    . methylPREDNISolone (MEDROL DOSEPAK) 4 MG TBPK tablet Take Tapered dose as directed 21 tablet 0  . montelukast (SINGULAIR) 10 MG tablet TAKE 1 TABLET BY MOUTH ONCE DAILY FOR ASTHMA OR ALLERGIES.    Marland Kitchen norethindrone (AYGESTIN) 5 MG tablet Take 2.5 mg by mouth 2 (two) times daily.     Marland Kitchen omeprazole (PRILOSEC) 20 MG capsule TAKE 1 CAPSULE BY MOUTH ONCE DAILY FOR REFLUX    . OZEMPIC, 0.25 OR 0.5 MG/DOSE, 2 MG/1.5ML SOPN Inject 1 mg into the skin.    Marland Kitchen PROAIR HFA 108 (90 Base) MCG/ACT inhaler Inhale 2 puffs into the lungs every 6 (six) hours as needed for wheezing or shortness of breath.     . rosuvastatin (CRESTOR) 40 MG tablet TAKE 1 TABLET BY MOUTH ONCE DAILY FOR HIGH CHOLESTEROL    . sertraline (ZOLOFT) 100 MG tablet Take 2 tablets (200 mg total) by mouth daily. 60 tablet 2  . TRADJENTA 5 MG TABS tablet     . traZODone (DESYREL) 100 MG tablet Take 1-1.5 tablets (100-150 mg total) by mouth at bedtime as needed for sleep. 45 tablet 1  . TRUEPLUS PEN NEEDLES 32G X 4 MM MISC      No current facility-administered medications for this visit.     Musculoskeletal: Strength & Muscle Tone: UTA Gait & Station: UTA Patient leans: N/A  Psychiatric Specialty Exam: Review of Systems  Psychiatric/Behavioral: Negative for agitation, behavioral problems, confusion, decreased concentration, dysphoric mood, hallucinations, self-injury, sleep disturbance and suicidal ideas. The patient is not nervous/anxious and is not hyperactive.   All other systems reviewed and are negative.   There were no vitals taken for this visit.There is no height or weight on file to calculate BMI.  General Appearance: Casual  Eye Contact:  Fair  Speech:  Normal Rate  Volume:  Normal  Mood:  Euthymic  Affect:  Congruent  Thought Process:  Goal Directed and Descriptions  of Associations: Intact  Orientation:  Full (Time, Place, and Person)  Thought Content: Logical   Suicidal Thoughts:  No  Homicidal Thoughts:  No  Memory:  Immediate;   Fair Recent;   Fair Remote;   Fair  Judgement:  Fair  Insight:  Fair  Psychomotor Activity:  Normal  Concentration:  Concentration: Fair and Attention Span: Fair  Recall:  [employer] of Knowledge: Fair  Language: Fair  Akathisia:  No  Handed:  Right  AIMS (if indicated): UTA  Assets:  Communication Skills Desire for Improvement Housing Social Support  ADL's:  Intact  Cognition: WNL  Sleep:  Fair   Screenings: AUDIT   Flowsheet Row Admission (Discharged) from 12/06/2015 in [place]  Alcohol Use Disorder Identification Test Final Score (AUDIT) 1    PHQ2-9   Flowsheet Row Video Visit from 05/01/2020 in [facility]  PHQ-2 Total Score 1    Flowsheet Row Video Visit from 05/01/2020 in [facility] ED from 03/20/2020 in [place] CATEGORY Low Risk No Risk       Assessment and Plan: Shari [name] is a 25 year old African-American female who has a history of depression, GAD was evaluated by telemedicine today.  Patient is currently stable.  Plan as noted below.  Plan MDD intromission Zoloft 200 mg p.o. daily Continue CBT as needed  GAD-stable Zoloft as prescribed Hydroxyzine 12.5-25 mg p.o. daily as needed for severe anxiety attacks  Insomnia-stable Trazodone 100 to 200 mg p.o. nightly as needed  Follow-up in clinic in 3 months or sooner if needed.  This note was generated in part or whole with voice recognition software. Voice recognition is usually quite accurate but there are transcription errors that can and very often do occur. I apologize for any typographical errors that were not detected and corrected.        Ursula Alert, MD 05/01/2020, 5:07 PM

## 2020-06-04 ENCOUNTER — Other Ambulatory Visit: Payer: Self-pay

## 2020-06-04 ENCOUNTER — Emergency Department
Admission: EM | Admit: 2020-06-04 | Discharge: 2020-06-04 | Disposition: A | Discharge status: Home / Self Care | Payer: BLUE CROSS/BLUE SHIELD | Attending: Emergency Medicine | Admitting: Emergency Medicine

## 2020-06-04 ENCOUNTER — Encounter: Payer: Self-pay | Admitting: Intensive Care

## 2020-06-04 DIAGNOSIS — R102 Pelvic and perineal pain: Secondary | ICD-10-CM | POA: Diagnosis present

## 2020-06-04 DIAGNOSIS — N390 Urinary tract infection, site not specified: Secondary | ICD-10-CM | POA: Insufficient documentation

## 2020-06-04 DIAGNOSIS — E11620 Type 2 diabetes mellitus with diabetic dermatitis: Secondary | ICD-10-CM | POA: Diagnosis not present

## 2020-06-04 DIAGNOSIS — Z794 Long term (current) use of insulin: Secondary | ICD-10-CM | POA: Diagnosis not present

## 2020-06-04 DIAGNOSIS — J45909 Unspecified asthma, uncomplicated: Secondary | ICD-10-CM | POA: Insufficient documentation

## 2020-06-04 DIAGNOSIS — B373 Candidiasis of vulva and vagina: Secondary | ICD-10-CM | POA: Diagnosis not present

## 2020-06-04 DIAGNOSIS — R319 Hematuria, unspecified: Secondary | ICD-10-CM

## 2020-06-04 DIAGNOSIS — B3731 Acute candidiasis of vulva and vagina: Secondary | ICD-10-CM

## 2020-06-04 DIAGNOSIS — Z7984 Long term (current) use of oral hypoglycemic drugs: Secondary | ICD-10-CM | POA: Diagnosis not present

## 2020-06-04 DIAGNOSIS — Z7952 Long term (current) use of systemic steroids: Secondary | ICD-10-CM | POA: Diagnosis not present

## 2020-06-04 LAB — URINALYSIS, COMPLETE (UACMP) WITH MICROSCOPIC
Bilirubin Urine: NEGATIVE
Glucose, UA: >=500 mg/dL — AB
Hgb urine dipstick: NEGATIVE
Ketones, ur: NEGATIVE mg/dL
Nitrite: NEGATIVE
Protein, ur: 100 mg/dL — AB
RBC / HPF: >50 RBC/hpf — ABNORMAL HIGH (ref 0–5)
Specific Gravity, Urine: 1.028 (ref 1.005–1.030)
WBC, UA: >50 WBC/hpf — ABNORMAL HIGH (ref 0–5)
pH: 6 (ref 5.0–8.0)

## 2020-06-04 LAB — CHLAMYDIA/NGC RT PCR (ARMC ONLY)
Chlamydia Tr: NOT DETECTED
N gonorrhoeae: NOT DETECTED

## 2020-06-04 LAB — WET PREP, GENITAL
Clue Cells Wet Prep HPF POC: NONE SEEN
Sperm: NONE SEEN
Trich, Wet Prep: NONE SEEN

## 2020-06-04 LAB — CBG MONITORING, ED: Glucose-Capillary: 285 mg/dL — ABNORMAL HIGH (ref 70–99)

## 2020-06-04 LAB — POC URINE PREG, ED: Preg Test, Ur: NEGATIVE

## 2020-06-04 MED ORDER — FLUCONAZOLE 150 MG PO TABS: 150.0000 mg | ORAL_TABLET | Freq: Every day | ORAL | 1 refills | Status: DC

## 2020-06-04 MED ORDER — NYSTATIN 100000 UNIT/GM EX CREA
1.0000 | TOPICAL_CREAM | Freq: Two times a day (BID) | CUTANEOUS | 1 refills | Status: DC
Start: 2020-06-04 — End: 2021-05-30

## 2020-06-04 MED ORDER — NITROFURANTOIN MONOHYD MACRO 100 MG PO CAPS: 100.0000 mg | ORAL_CAPSULE | Freq: Two times a day (BID) | ORAL | 0 refills | Status: AC

## 2020-06-04 NOTE — ED Triage Notes (Signed)
Patient c/o labia swelling and possible yeast infection that started X2 days ago. C/o yellowish/white discharge

## 2020-06-04 NOTE — Discharge Instructions (Addendum)
Call make an appointment with your primary care provider for follow-up on your diabetes.  Read the information about diabetes and nutrition and stick to your diabetic diet.  Continue with your regular medications.  A prescription for Diflucan was sent to the pharmacy with 1 refill.  The prescription for your urinary tract infection is twice a day for the next 7 days and a cream was also prescribed to use externally twice a day until completely cleared.  This cream also has a refill on it as well.

## 2020-06-04 NOTE — ED Provider Notes (Signed)
[facility] Emergency Department Provider Note  ____________________________________________   Event Date/Time   First MD Initiated Contact with Patient 06/04/20 0831     (approximate)  I have reviewed the triage vital signs and the nursing notes.   HISTORY  Chief Complaint Vaginal Pain   HPI Shari [name] is a 25 y.o. female presents to the ED with complaint of possible yeast infection with yellow-white vaginal discharge.  Patient also reports that she has had urinary frequency.  Is a diabetic and uses both metformin and insulin.  Last time she checked her blood sugar was approximately 3 to 4 months ago.  She does not adhere to a diabetic diet.  Patient reports that currently she is fasting and last ate at 11 PM however she consumed alcohol during the night and is vague about the amount.  She denies any fever, chills, nausea or vomiting.  No flank pain.  She rates her pain as 10/10.         Past Medical History:  Diagnosis Date  . Anxiety   . Asthma    well controlled  . Diabetes mellitus without complication ([place])    pt states she went off her meds because her numbers were fine  . Endometriosis   . Hidradenitis suppurativa 12/31 /2019  . Pneumonia 01/2017  . Refusal of blood product     Patient Active Problem List   Diagnosis Date Noted  . Thrombocytosis 01/13/2020  . Uncontrolled type 2 diabetes mellitus with insulin therapy ([place]) 01/12/2020  . Noncompliance with medication regimen 01/12/2020  . Hyperlipidemia associated with type 2 diabetes mellitus ([place]) 01/12/2020  . MDD (major depressive disorder), recurrent, in full remission ([place]) 11/22/2019  . 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 MDD (major depressive disorder), recurrent, in partial remission ([place]) 03/23/2019  . Hyperlipidemia 03/03/2019  . MDD (major depressive disorder), recurrent episode, moderate ([employer]) 02/09/2019  . MDD (major depressive disorder), recurrent, severe, with psychosis  ([place]) 09/10/2018  . GAD (generalized anxiety disorder) 09/10/2018  . 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 Fibroid uterus 06/10/2017  . Uterine mass 04/22/2017  . Pneumonia 01/28/2017  . Hyperprolactinemia ([place]) 12/30/2015  . Asthma 12/07/2015  . Diabetes mellitus without complication ([place]) 91/47/8295  . Obesity 12/06/2015  . Chronic pain 12/06/2015  . Schizophrenia spectrum disorder with psychotic disorder type not yet determined ([place]) 12/06/2015  . Mass of breast 09/07/2015  . Adenitis 09/07/2015  . Pain in both feet 05/02/2015  . 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 Female pelvic pain 06/04/2012  . Pelvic and perineal pain 06/04/2012  . Incomplete emptying of bladder 12/13/2011  . Retention of urine 12/13/2011  . Symptoms involving urinary system 12/13/2011  . Urge incontinence 12/13/2011  . Increased frequency of urination 12/13/2011  . 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 Regular astigmatism 05/07/2010    Past Surgical History:  Procedure Laterality Date  . ADENOIDECTOMY Bilateral 02/09/2018   Procedure: REVISION ADENOIDECTOMY;  Surgeon: Carloyn Manner, MD;  Location: [facility] ORS;  Service: ENT;  Laterality: Bilateral;  . EYE SURGERY    . MYRINGOTOMY WITH TUBE PLACEMENT Right 02/09/2018   Procedure: MYRINGOTOMY WITH TUBE PLACEMENT;  Surgeon: Carloyn Manner, MD;  Location: [facility] ORS;  Service: ENT;  Laterality: Right;  . NASOPHARYNGOSCOPY EUSTATION TUBE BALLOON DILATION Bilateral 02/09/2018   Procedure: NASOPHARYNGOSCOPY EUSTATION TUBE BALLOON DILATION;  Surgeon: Carloyn Manner, MD;  Location: [facility] ORS;  Service: ENT;  Laterality: Bilateral;  . TONSILLECTOMY    . TURBINATE REDUCTION Right 02/09/2018  Procedure: OUTFRACTURE RIGHT INFERIOR TURBINATE;  Surgeon: Carloyn Manner, MD;  Location: [facility] ORS;  Service: ENT;  Laterality: Right;    Prior to Admission medications   Medication Sig Start Date End Date Taking? Authorizing Provider  fluconazole (DIFLUCAN) 150 MG tablet Take  1 tablet (150 mg total) by mouth daily. 06/04/20  Yes Johnn Hai, PA-C  nitrofurantoin, macrocrystal-monohydrate, (MACROBID) 100 MG capsule Take 1 capsule (100 mg total) by mouth 2 (two) times daily for 7 days. 06/04/20 06/11/20 Yes Taten Merrow L, PA-C  nystatin cream (MYCOSTATIN) Apply 1 application topically 2 (two) times daily. Apply to external vaginal area twice a day for yeast infection. 06/04/20  Yes Letitia Neri L, PA-C  bismuth subsalicylate (PEPTO BISMOL) 262 MG/15ML suspension Take 30 mLs by mouth every 6 (six) hours as needed for indigestion or diarrhea or loose stools.    [provider]  Blood Glucose Monitoring Suppl (FIFTY50 GLUCOSE METER 2.0) w/Device KIT Use as instructed. Ok to dispense whichever glucometer is covered by insurance E11.65 04/03/20   [provider]  Dulaglutide 0.75 MG/0.5ML SOPN Inject into the skin. 03/29/19   [provider]  etonogestrel (NEXPLANON) 68 MG IMPL implant 68 mg by Subdermal route once.     [provider]  exenatide (BYETTA 10 MCG PEN) 10 MCG/0.04ML SOPN injection Inject 10 mcg into the skin 2 (two) times daily with a meal.    [provider]  FEROSUL 325 (65 Fe) MG tablet Take 325 mg by mouth daily. 01/20/20   [provider]  fluticasone (FLONASE) 50 MCG/ACT nasal spray Place into the nose. 12/21/19   [provider]  fluticasone (FLONASE) 50 MCG/ACT nasal spray Place into both nostrils. 12/21/19   [provider]  Fluticasone-Salmeterol (ADVAIR) 250-50 MCG/DOSE AEPB fluticasone 250 mcg-salmeterol 50 mcg/dose blistr powdr for inhalation    [provider]  gabapentin (NEURONTIN) 300 MG capsule Take by mouth. 12/10/19   [provider]  glucose blood (PRECISION QID TEST) test strip Dispense 100 blood glucose test strips, ok to sub any brand preferred by insurance/patient to match with meter, use 3x/day, dx E11.65 04/03/20   [provider]   hydrocortisone (ANUSOL-HC) 25 MG suppository Place rectally. 02/09/20 02/08/21  [provider]  Insulin Degludec (TRESIBA FLEXTOUCH) 200 UNIT/ML SOPN Inject into the skin. 03/02/19   [provider]  JARDIANCE 25 MG TABS tablet  09/17/18   [provider]  ketoconazole (NIZORAL) 2 % cream Apply 1 application topically 2 (two) times daily. 12/01/18   [provider]  letrozole [facility]) 2.5 MG tablet  09/04/18   [provider]  metFORMIN (GLUCOPHAGE-XR) 500 MG 24 hr tablet TAKE 1 TABLET BY MOUTH TWICE DAILY FOR DIABETES. 05/05/18   [provider]  methylPREDNISolone (MEDROL DOSEPAK) 4 MG TBPK tablet Take Tapered dose as directed 06/09/19   Sable Feil, PA-C  montelukast (SINGULAIR) 10 MG tablet TAKE 1 TABLET BY MOUTH ONCE DAILY FOR ASTHMA OR ALLERGIES. 05/05/18   [provider]  norethindrone (AYGESTIN) 5 MG tablet Take 2.5 mg by mouth 2 (two) times daily.  11/14/17   [provider]  omeprazole (PRILOSEC) 20 MG capsule TAKE 1 CAPSULE BY MOUTH ONCE DAILY FOR REFLUX 07/06/18   [provider]  OZEMPIC, 0.25 OR 0.5 MG/DOSE, 2 MG/1.5ML SOPN Inject 1 mg into the skin. 01/26/20   [provider]  PROAIR HFA 108 (90 Base) MCG/ACT inhaler Inhale 2 puffs into the lungs every 6 (six) hours as needed  for wheezing or shortness of breath.  12/09/17   [provider]  rosuvastatin (CRESTOR) 40 MG tablet TAKE 1 TABLET BY MOUTH ONCE DAILY FOR HIGH CHOLESTEROL 04/16/18   [provider]  sertraline (ZOLOFT) 100 MG tablet Take 2 tablets (200 mg total) by mouth daily. 05/01/20   Ursula Alert, MD  TRADJENTA 5 MG TABS tablet  07/14/18   [provider]  traZODone (DESYREL) 100 MG tablet Take 1-1.5 tablets (100-150 mg total) by mouth at bedtime as needed for sleep. 02/24/20   Ursula Alert, MD  TRUEPLUS PEN NEEDLES 32G X 4 MM MISC  02/08/19   [provider]    Allergies Sulfa antibiotics and  Tramadol  Family History  Problem Relation Age of Onset  . Anxiety disorder Mother   . Depression Mother   . Alcohol abuse Father   . Drug abuse Father   . Schizophrenia Maternal Uncle     Social History Social History   Tobacco Use  . Smoking status: Never Smoker  . Smokeless tobacco: Never Used  Vaping Use  . Vaping Use: Never used  Substance Use Topics  . Alcohol use: Yes    Comment: rare  . Drug use: Yes    Types: Marijuana    Review of Systems Constitutional: No fever/chills Eyes: No visual changes. ENT: No sore throat. Cardiovascular: Denies chest pain. Respiratory: Denies shortness of breath. Gastrointestinal: No abdominal pain.  No nausea, no vomiting. Genitourinary: Positive for dysuria and vaginal discharge. Musculoskeletal: Negative for back pain. Skin: Negative for rash. Neurological: Negative for headaches, focal weakness or numbness. ____________________________________________   PHYSICAL EXAM:  VITAL SIGNS: ED Triage Vitals  Enc Vitals Group     BP 06/04/20 0825 126/78     Pulse Rate 06/04/20 0825 (!) 115     Resp 06/04/20 0825 16     Temp 06/04/20 0825 99.3 F (37.4 C)     Temp Source 06/04/20 0825 Oral     SpO2 06/04/20 0825 98 %     Weight 06/04/20 0826 276 lb (125.2 kg)     Height 06/04/20 0826 5' 8"  (1.727 m)     Head Circumference --      Peak Flow --      Pain Score 06/04/20 0826 10     Pain Loc --      Pain Edu? --      Excl. in [place]? --     Constitutional: Alert and oriented. Well appearing and in no acute distress. Eyes: Conjunctivae are normal.  Head: Atraumatic. Neck: No stridor.   Cardiovascular: Normal rate, regular rhythm. Grossly normal heart sounds.  Good peripheral circulation. Respiratory: Normal respiratory effort.  No retractions. Lungs CTAB. Gastrointestinal: Soft and nontender. No distention.  CVA tenderness. Genitourinary: External genitalia with heavy involvement of yeast with discrete margins and mild erythema.   Moderate amount of white yeast like substance in the vaginal vault.  Patient refused bimanual exam. Musculoskeletal: No lower extremity tenderness nor edema.  Patient is able to ambulate without any assistance and was noted with normal gait while in the ED.   Neurologic:  Normal speech and language. No gross focal neurologic deficits are appreciated. No gait instability. Skin:  Skin is warm, dry skin as noted above in genitourinary. Psychiatric: Mood and affect are normal. Speech and behavior are normal.  ____________________________________________   LABS (all labs ordered are listed, but only abnormal results are displayed)  Labs Reviewed  WET PREP, GENITAL - Abnormal; Notable for the following  components:      Result Value   Yeast Wet Prep HPF POC PRESENT (*)    WBC, Wet Prep HPF POC MANY (*)    All other components within normal limits  URINALYSIS, COMPLETE (UACMP) WITH MICROSCOPIC - Abnormal; Notable for the following components:   Color, Urine YELLOW (*)    APPearance TURBID (*)    Glucose, UA >=500 (*)    Protein, ur 100 (*)    Leukocytes,Ua LARGE (*)    RBC / HPF >50 (*)    WBC, UA >50 (*)    Bacteria, UA MANY (*)    All other components within normal limits  CBG MONITORING, ED - Abnormal; Notable for the following components:   Glucose-Capillary 285 (*)    All other components within normal limits  CHLAMYDIA/NGC RT PCR ([facility] ONLY)  POC URINE PREG, ED    PROCEDURES  Procedure(s) performed (including Critical Care):  Procedures   ____________________________________________   INITIAL IMPRESSION / ASSESSMENT AND PLAN / ED COURSE  As part of my medical decision making, I reviewed the following data within the electronic MEDICAL RECORD NUMBER Notes from prior ED visits and  Controlled Substance Database  26 year old female presents to the ED with complaint of urinary frequency along with vaginal discharge and pain.  Patient has diabetes and states she has  been taking her metformin and insulin.  Patient states that she does not check her blood sugars and the last time it was checked was probably 3 to 4 months ago.  She reports that she drank a lot of alcohol last night and this morning is fasting.  She has been using over-the-counter cream believing that she has a yeast infection without any relief.  Urinalysis is consistent with a urinary tract infection.  Physical exam patient has a moderate amount of yeast present and wet prep also confirms this.  Patient was told that as long as her blood sugars are running high that she will continue to have frequent yeast infections and is encouraged to follow-up with her primary care provider and adhere to her diabetic diet.  A prescription for Diflucan was sent to the pharmacy with 1 refill along with nystatin cream and MicroBid twice daily for 7 days.  Information about diabetic diet was also given to the patient to refresh her memory on what she should be eating.  ____________________________________________   FINAL CLINICAL IMPRESSION(S) / ED DIAGNOSES  Final diagnoses:  Vulvovaginitis due to yeast  Type 2 diabetes mellitus with diabetic dermatitis, unspecified whether long term insulin use (HCC)  Urinary tract infection with hematuria, site unspecified     ED Discharge Orders         Ordered    fluconazole (DIFLUCAN) 150 MG tablet  Daily        06/04/20 1201    nystatin cream (MYCOSTATIN)  2 times daily        06/04/20 1201    nitrofurantoin, macrocrystal-monohydrate, (MACROBID) 100 MG capsule  2 times daily        06/04/20 1201          *Please note:  Merisa Julio was evaluated in Emergency Department on 06/04/2020 for the symptoms described in the history of present illness. She was evaluated in the context of the global COVID-19 pandemic, which necessitated consideration that the patient might be at risk for infection with the SARS-CoV-2 virus that causes COVID-19. Institutional protocols and  algorithms that pertain to the evaluation of patients at risk for COVID-19 are  in a state of rapid change based on information released by regulatory bodies including the CDC and federal and state organizations. These policies and algorithms were followed during the patient's care in the ED.  Some ED evaluations and interventions may be delayed as a result of limited staffing during and the pandemic.*   Note:  This document was prepared using Dragon voice recognition software and may include unintentional dictation errors.    Johnn Hai, PA-C 06/04/20 1229    Vladimir Crofts, MD 06/04/20 1414

## 2020-07-14 DIAGNOSIS — B009 Herpesviral infection, unspecified: Secondary | ICD-10-CM | POA: Insufficient documentation

## 2020-08-07 ENCOUNTER — Encounter: Payer: Self-pay | Admitting: Psychiatry

## 2020-08-07 ENCOUNTER — Telehealth (INDEPENDENT_AMBULATORY_CARE_PROVIDER_SITE_OTHER): Payer: BC Managed Care – PPO | Admitting: Psychiatry

## 2020-08-07 ENCOUNTER — Other Ambulatory Visit: Payer: Self-pay

## 2020-08-07 DIAGNOSIS — F3342 Major depressive disorder, recurrent, in full remission: Secondary | ICD-10-CM

## 2020-08-07 DIAGNOSIS — F4321 Adjustment disorder with depressed mood: Secondary | ICD-10-CM

## 2020-08-07 DIAGNOSIS — F411 Generalized anxiety disorder: Secondary | ICD-10-CM

## 2020-08-07 DIAGNOSIS — F5105 Insomnia due to other mental disorder: Secondary | ICD-10-CM

## 2020-08-07 MED ORDER — TRAZODONE HCL 100 MG PO TABS: 100.0000 mg | ORAL_TABLET | Freq: Every evening | ORAL | 1 refills | Status: DC | PRN

## 2020-08-07 MED ORDER — SERTRALINE HCL 100 MG PO TABS: 200.0000 mg | ORAL_TABLET | Freq: Every day | ORAL | 2 refills | Status: DC

## 2020-08-07 NOTE — Progress Notes (Signed)
Virtual Visit via Video Note  I connected with Shari [name] on 08/07/20 at 10:20 AM EDT by a video enabled telemedicine application and verified that I am speaking with the correct person using two identifiers.  Location Provider Location : [facility] Patient Location : Work  Participants: Patient , Provider   I discussed the limitations of evaluation and management by telemedicine and the availability of in person appointments. The patient expressed understanding and agreed to proceed.    I discussed the assessment and treatment plan with the patient. The patient was provided an opportunity to ask questions and all were answered. The patient agreed with the plan and demonstrated an understanding of the instructions.   The patient was advised to call back or seek an in-person evaluation if the symptoms worsen or if the condition fails to improve as anticipated.   [facility] MD OP Progress Note  08/07/2020 11:02 AM Shari [name]  MRN:  [id number]  Chief Complaint:  Chief Complaint   Follow-up; Depression    HPI: Shari [name] is a 25 year old African-American female, employed, lives in [place], has a history of MDD, GAD, insomnia was evaluated by telemedicine today.  Patient today reports she recently went through a break-up with her boyfriend.  She reports she hence has had days when she felt sad and also struggled with appetite changes.  She however reports she feels much better and is eating better now.  She reports she is able to function okay at work.  She does have good social support system, her friend and her sister are very supportive.  She reports she had recent cardiology evaluation for heart palpitation and was diagnosed with familial hypercholesterolemia.  She was started on Zetia however has not started the medication yet .  She reports she was upset with cardiologist for making certain comments about her family history.  She reports that is one of the reason she did not want to  start the medication however is willing to try it  Patient reports she got sick with a stomach virus several months ago and for a long time she could not take her Zoloft or any other medication since just the thought of taking medications caused her to be nauseous.  She however reports she is better about taking her medications now.  She agrees to touch base with her primary care provider for an evaluation if her GI symptoms return.  Patient denies any suicidality or homicidality.  She does have a history of chronic hallucinations it happens only when she is waking up from her sleep and it does not distress her.  Patient reports she has been coping okay.  Recently she saw a shadow or an image as she was waking up from her sleep.  She is not in psychotherapy sessions at this time.  She however agrees to get established with a therapist.  Patient denies any other concerns today.  Visit Diagnosis:    ICD-10-CM   1. MDD (major depressive disorder), recurrent, in full remission ([place])  F33.42     2. GAD (generalized anxiety disorder)  F41.1 sertraline (ZOLOFT) 100 MG tablet    3. Insomnia due to mental condition  F51.05 traZODone (DESYREL) 100 MG tablet    4. Adjustment disorder with depressed mood  F43.21       Past Psychiatric History: Reviewed past psychiatric history from progress note on 02/12/2018.  Past trials of Wellbutrin, BuSpar  Past Medical History:  Past Medical History:  Diagnosis Date   Anxiety  Asthma    well controlled   Diabetes mellitus without complication ([place])    pt states she went off her meds because her numbers were fine   Endometriosis    Hidradenitis suppurativa 12/31 /2019   Pneumonia 01/2017   Refusal of blood product     Past Surgical History:  Procedure Laterality Date   ADENOIDECTOMY Bilateral 02/09/2018   Procedure: REVISION ADENOIDECTOMY;  Surgeon: Carloyn Manner, MD;  Location: [facility];  Service: ENT;  Laterality: Bilateral;   EYE SURGERY      MYRINGOTOMY WITH TUBE PLACEMENT Right 02/09/2018   Procedure: MYRINGOTOMY WITH TUBE PLACEMENT;  Surgeon: Carloyn Manner, MD;  Location: [facility];  Service: ENT;  Laterality: Right;   NASOPHARYNGOSCOPY EUSTATION TUBE BALLOON DILATION Bilateral 02/09/2018   Procedure: NASOPHARYNGOSCOPY [place];  Surgeon: Carloyn Manner, MD;  Location: [facility];  Service: ENT;  Laterality: Bilateral;   TONSILLECTOMY     TURBINATE REDUCTION Right 02/09/2018   Procedure: OUTFRACTURE RIGHT INFERIOR TURBINATE;  Surgeon: Carloyn Manner, MD;  Location: [facility];  Service: ENT;  Laterality: Right;    Family Psychiatric History: I have reviewed family psychiatric history from my progress note on 02/12/2018  Family History:  Family History  Problem Relation Age of Onset   Anxiety disorder Mother    Depression Mother    Alcohol abuse Father    Drug abuse Father    Schizophrenia Maternal Uncle     Social History: Reviewed social history from progress note on 02/12/2018 Social History   Socioeconomic History   Marital status: Single    Spouse name: Not on file   Number of children: 0   Years of education: Not on file   Highest education level: Associate degree: occupational, Hotel manager, or vocational program  Occupational History   Not on file  Tobacco Use   Smoking status: Never   Smokeless tobacco: Never  Vaping Use   Vaping Use: Never used  Substance and Sexual Activity   Alcohol use: Yes    Comment: rare   Drug use: Yes    Types: Marijuana   Sexual activity: Yes    Partners: Male    Birth control/protection: Implant, Condom  Other Topics Concern   Not on file  Social History Narrative   Not on file   Social Determinants of Health   Financial Resource Strain: Not on file  Food Insecurity: Not on file  Transportation Needs: Not on file  Physical Activity: Not on file  Stress: Not on file  Social Connections: Not on file    Allergies:  Allergies  Allergen  Reactions   Sulfa Antibiotics Hives   Tramadol Other (See Comments)    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    Metabolic Disorder Labs: Lab Results  Component Value Date   HGBA1C 6.0 (H) 12/07/2015   MPG 126 12/07/2015   Lab Results  Component Value Date   PROLACTIN 14.1 12/07/2015   Lab Results  Component Value Date   CHOL 321 (H) 12/07/2015   TRIG 71 12/07/2015   HDL 41 12/07/2015   CHOLHDL 7.8 12/07/2015   VLDL 14 12/07/2015   LDLCALC 266 (H) 12/07/2015   Lab Results  Component Value Date   TSH 1.265 12/07/2015    Therapeutic Level Labs: No results found for: LITHIUM No results found for: VALPROATE No components found for:  CBMZ  Current Medications: Current Outpatient Medications  Medication Sig Dispense Refill   bismuth subsalicylate (PEPTO BISMOL) 262 MG/15ML suspension Take 30 mLs  by mouth every 6 (six) hours as needed for indigestion or diarrhea or loose stools.     Blood Glucose Monitoring Suppl (FIFTY50 GLUCOSE METER 2.0) w/Device KIT Use as instructed. Ok to dispense whichever glucometer is covered by insurance E11.65     Dulaglutide 0.75 MG/0.5ML SOPN Inject into the skin.     etonogestrel (NEXPLANON) 68 MG IMPL implant 68 mg by Subdermal route once.      exenatide (BYETTA 10 MCG PEN) 10 MCG/0.04ML SOPN injection Inject 10 mcg into the skin 2 (two) times daily with a meal.     ezetimibe (ZETIA) 10 MG tablet Take 10 mg by mouth daily.     FEROSUL 325 (65 Fe) MG tablet Take 325 mg by mouth daily.     fluconazole (DIFLUCAN) 150 MG tablet Take 1 tablet (150 mg total) by mouth daily. 1 tablet 1   fluticasone (FLONASE) 50 MCG/ACT nasal spray Place into the nose.     fluticasone (FLONASE) 50 MCG/ACT nasal spray Place into both nostrils.     Fluticasone-Salmeterol (ADVAIR) 250-50 MCG/DOSE AEPB fluticasone 250 mcg-salmeterol 50 mcg/dose blistr powdr for inhalation     gabapentin (NEURONTIN) 300 MG capsule Take by mouth.     glucose blood (PRECISION QID TEST) test  strip Dispense 100 blood glucose test strips, ok to sub any brand preferred by insurance/patient to match with meter, use 3x/day, dx E11.65     hydrocortisone (ANUSOL-HC) 25 MG suppository Place rectally.     Insulin Degludec (TRESIBA FLEXTOUCH) 200 UNIT/ML SOPN Inject into the skin.     JARDIANCE 25 MG TABS tablet      ketoconazole (NIZORAL) 2 % cream Apply 1 application topically 2 (two) times daily.     letrozole (FEMARA) 2.5 MG tablet      metFORMIN (GLUCOPHAGE-XR) 500 MG 24 hr tablet TAKE 1 TABLET BY MOUTH TWICE DAILY FOR DIABETES.     methylPREDNISolone (MEDROL DOSEPAK) 4 MG TBPK tablet Take Tapered dose as directed 21 tablet 0   montelukast (SINGULAIR) 10 MG tablet TAKE 1 TABLET BY MOUTH ONCE DAILY FOR ASTHMA OR ALLERGIES.     norethindrone (AYGESTIN) 5 MG tablet Take 2.5 mg by mouth 2 (two) times daily.      nystatin cream (MYCOSTATIN) Apply 1 application topically 2 (two) times daily. Apply to external vaginal area twice a day for yeast infection. 30 g 1   omeprazole (PRILOSEC) 20 MG capsule TAKE 1 CAPSULE BY MOUTH ONCE DAILY FOR REFLUX     OZEMPIC, 0.25 OR 0.5 MG/DOSE, 2 MG/1.5ML SOPN Inject 1 mg into the skin.     PROAIR HFA 108 (90 Base) MCG/ACT inhaler Inhale 2 puffs into the lungs every 6 (six) hours as needed for wheezing or shortness of breath.      rosuvastatin (CRESTOR) 40 MG tablet TAKE 1 TABLET BY MOUTH ONCE DAILY FOR HIGH CHOLESTEROL     sertraline (ZOLOFT) 100 MG tablet Take 2 tablets (200 mg total) by mouth daily. 60 tablet 2   TRADJENTA 5 MG TABS tablet      traZODone (DESYREL) 100 MG tablet Take 1-1.5 tablets (100-150 mg total) by mouth at bedtime as needed for sleep. 45 tablet 1   TRUEPLUS PEN NEEDLES 32G X 4 MM MISC      No current facility-administered medications for this visit.     Musculoskeletal: Strength & Muscle Tone:  UTA Gait & Station:  UTA Patient leans: N/A  Psychiatric Specialty Exam: Review of Systems  Psychiatric/Behavioral:  Positive for  dysphoric  mood and hallucinations.   All other systems reviewed and are negative.  There were no vitals taken for this visit.There is no height or weight on file to calculate BMI.  General Appearance: Casual  Eye Contact:  Good  Speech:  Clear and Coherent  Volume:  Normal  Mood:  Dysphoric  Affect:  Congruent  Thought Process:  Goal Directed and Descriptions of Associations: Intact  Orientation:  Full (Time, Place, and Person)  Thought Content: Hallucinations: Visual chronic - does not bother her - shadow or image recently - denies it now  Suicidal Thoughts:  No  Homicidal Thoughts:  No  Memory:  Immediate;   Fair Recent;   Fair Remote;   Fair  Judgement:  Fair  Insight:  Fair  Psychomotor Activity:  Normal  Concentration:  Concentration: Fair and Attention Span: Fair  Recall:  [employer] of Knowledge: Fair  Language: Fair  Akathisia:  No  Handed:  Right  AIMS (if indicated): not done  Assets:  Communication Skills Desire for [place] Talents/Skills Transportation Vocational/Educational  ADL's:  Intact  Cognition: WNL  Sleep:  Fair   Screenings: AUDIT    Flowsheet Row Admission (Discharged) from 12/06/2015 in [facility]  Alcohol Use Disorder Identification Test Final Score (AUDIT) 1      PHQ2-9    Flowsheet Row Video Visit from 08/07/2020 in [facility] Video Visit from 05/01/2020 in [facility]  PHQ-2 Total Score 2 1  PHQ-9 Total Score 3 --      Flowsheet Row Video Visit from 08/07/2020 in [facility] from 06/04/2020 in [facility] Video Visit from 05/01/2020 in [facility] Low Risk No Risk Low Risk        Assessment and Plan: Rin Gorton is a 25 year old African-American female who is employed, lives in [place], has a history  of depression, GAD was evaluated by telemedicine today.  Patient is currently going through relationship problems which does have an impact on her mood.  Patient also with noncompliance with medication is attempting to be more compliant.  She will benefit from psychotherapy sessions.  Plan as noted below.  Plan MDD in remission Zoloft 200 mg p.o. daily   GAD-stable Zoloft as prescribed Hydroxyzine 12.5-25 mg p.o. daily as needed for severe anxiety attacks  Insomnia-stable Trazodone 100-200 mg p.o. nightly as needed  Adjustment disorder with depressed mood-unstable Patient will benefit from psychotherapy sessions.  Patient to reach out to her health insurance plan, her health insurance recently changed.  She agrees to get established with a new therapist. Advised patient to reach out to writer if she is unable to establish care with a therapist soon. Patient encouraged to take the Zoloft on a regular basis since she has been noncompliant. Hydroxyzine as prescribed  I have reviewed notes per cardiology-dated 07/26/2020-Dr. Simpson-patient with familial hypercholesterolemia-started on Zetia 10 mg.  Continue Crestor.   Patient also to reach out to primary care provider for evaluation of GI problems.  Follow-up in clinic in 3 weeks or sooner if needed.  I have spent atleast 30 minutes face to face with patient today which includes the time spent for preparing to see the patient ( e.g., review of test, records ), obtaining and to review and separately obtained history , ordering medications and test ,psychoeducation and supportive psychotherapy as well as documenting clinical information in electronic health record,interpreting and communication  of test results   This note was generated in part or whole with voice recognition software. Voice recognition is usually quite accurate but there are transcription errors that can and very often do occur. I apologize for any typographical errors that  were not detected and corrected.       Ursula Alert, MD 08/07/2020, 11:02 AM

## 2020-08-22 ENCOUNTER — Other Ambulatory Visit: Payer: Self-pay

## 2020-08-22 ENCOUNTER — Emergency Department
Admission: EM | Admit: 2020-08-22 | Discharge: 2020-08-22 | Disposition: A | Discharge status: Home / Self Care | Payer: BLUE CROSS/BLUE SHIELD | Attending: Emergency Medicine | Admitting: Emergency Medicine

## 2020-08-22 ENCOUNTER — Encounter: Payer: Self-pay | Admitting: *Deleted

## 2020-08-22 DIAGNOSIS — R5383 Other fatigue: Secondary | ICD-10-CM | POA: Insufficient documentation

## 2020-08-22 DIAGNOSIS — R739 Hyperglycemia, unspecified: Secondary | ICD-10-CM | POA: Insufficient documentation

## 2020-08-22 DIAGNOSIS — M791 Myalgia, unspecified site: Secondary | ICD-10-CM | POA: Insufficient documentation

## 2020-08-22 DIAGNOSIS — Z5321 Procedure and treatment not carried out due to patient leaving prior to being seen by health care provider: Secondary | ICD-10-CM | POA: Insufficient documentation

## 2020-08-22 DIAGNOSIS — R110 Nausea: Secondary | ICD-10-CM | POA: Insufficient documentation

## 2020-08-22 LAB — CBG MONITORING, ED: Glucose-Capillary: 371 mg/dL — ABNORMAL HIGH (ref 70–99)

## 2020-08-22 NOTE — ED Triage Notes (Addendum)
Pt sent from [facility] for eval of high blood sugar.  Pt reports fatigue, nausea.  Pt reports bodyaches.  Pt alert  speech clear.   FSBS 371

## 2020-08-24 ENCOUNTER — Telehealth (INDEPENDENT_AMBULATORY_CARE_PROVIDER_SITE_OTHER): Payer: Self-pay | Admitting: Psychiatry

## 2020-08-24 ENCOUNTER — Other Ambulatory Visit: Payer: Self-pay

## 2020-08-24 DIAGNOSIS — Z9119 Patient's noncompliance with other medical treatment and regimen: Secondary | ICD-10-CM

## 2020-08-24 DIAGNOSIS — F5105 Insomnia due to other mental disorder: Secondary | ICD-10-CM

## 2020-08-24 DIAGNOSIS — F411 Generalized anxiety disorder: Secondary | ICD-10-CM

## 2020-08-24 DIAGNOSIS — F330 Major depressive disorder, recurrent, mild: Secondary | ICD-10-CM

## 2020-08-24 DIAGNOSIS — Z91199 Patient's noncompliance with other medical treatment and regimen due to unspecified reason: Secondary | ICD-10-CM

## 2020-08-24 NOTE — Progress Notes (Signed)
Virtual Visit via Video Note  I connected with Shari [name] on 08/24/20 at  2:20 PM EDT by a video enabled telemedicine application and verified that I am speaking with the correct person using two identifiers.  Location Provider Location : [facility] Patient Location : Work  Participants: Patient , Provider   I discussed the limitations of evaluation and management by telemedicine and the availability of in person appointments. The patient expressed understanding and agreed to proceed.   I discussed the assessment and treatment plan with the patient. The patient was provided an opportunity to ask questions and all were answered. The patient agreed with the plan and demonstrated an understanding of the instructions.   The patient was advised to call back or seek an in-person evaluation if the symptoms worsen or if the condition fails to improve as anticipated.   [place] MD OP Progress Note  08/24/2020 6:07 PM Teja Costen  MRN:  [id number]  Chief Complaint:  Chief Complaint   Follow-up; Anxiety; Depression    HPI: Shari [name] is a 25 year old African-American female, employed, lives in [place], has a history of MDD, GAD, insomnia, type 2 diabetes melitis was evaluated by telemedicine today.  Patient today reports that she is currently feeling depressed.  She feels tired, has sadness, low motivation, low energy.  Patient however reports she is still able to do her work and cannot concentrate.  She also reports she is able to enjoy things in her life okay.  Patient however reported she had self-injurious thoughts recently when she felt like she did not care if she died today.  She had thoughts like that this morning.  She however denies any active suicidal thoughts or plan.  She reports she gets like this when she stops taking her medications.  She had stopped all her medications especially her psychotropic medications for a week.  She reports she wants to go back on her medications since  she feels more and more depressed.  She also reports she worries constantly about her health.  She had to go to the emergency department recently due to changes in her blood sugar level.  She however did not go through with the evaluation in the ED since she did not feel like she could wait for care.  She however feels she feels fine now and is currently following up with her providers.  She reports sleep is okay.  Patient is currently not in psychotherapy.  She however agrees to start psychotherapy sessions with a therapist here at the practice.  Patient denies any other concerns today.  Visit Diagnosis:    ICD-10-CM   1. MDD (major depressive disorder), recurrent episode, mild ([place])  F33.0     2. GAD (generalized anxiety disorder)  F41.1     3. Insomnia due to mental condition  F51.05    mood    4. Noncompliance with treatment regimen  Z91.19       Past Psychiatric History: Reviewed past psychiatric history from progress note on 02/12/2018.  Past trials of Wellbutrin, BuSpar  Past Medical History:  Past Medical History:  Diagnosis Date   Anxiety    Asthma    well controlled   Diabetes mellitus without complication ([place])    pt states she went off her meds because her numbers were fine   Endometriosis    Hidradenitis suppurativa 12/31 /2019   Pneumonia 01/2017   Refusal of blood product     Past Surgical History:  Procedure Laterality Date   ADENOIDECTOMY  Bilateral 02/09/2018   Procedure: REVISION ADENOIDECTOMY;  Surgeon: Carloyn Manner, MD;  Location: [facility] OR[facility];  Service: ENT;  Laterality: Bilateral;   EYE SURGERY     MYRINGOTOMY WITH TUBE PLACEMENT Right 02/09/2018   Procedure: MYRINGOTOMY WITH TUBE PLACEMENT;  Surgeon: Carloyn Manner, MD;  Location: [facility] OR[facility];  Service: ENT;  Laterality: Right;   NASOPHARYNGOSCOPY EUSTATION TUBE BALLOON DILATION Bilateral 02/09/2018   Procedure: NASOPHARYNGOSCOPY [place];  Surgeon: Carloyn Manner, MD;   Location: [facility];  Service: ENT;  Laterality: Bilateral;   TONSILLECTOMY     TURBINATE REDUCTION Right 02/09/2018   Procedure: OUTFRACTURE RIGHT INFERIOR TURBINATE;  Surgeon: Carloyn Manner, MD;  Location: [facility] OR[facility];  Service: ENT;  Laterality: Right;    Family Psychiatric History: Reviewed family psychiatric history from progress note on 02/12/2018.  Family History:  Family History  Problem Relation Age of Onset   Anxiety disorder Mother    Depression Mother    Alcohol abuse Father    Drug abuse Father    Schizophrenia Maternal Uncle     Social History: Reviewed social history from progress note on 02/12/2018. Social History   Socioeconomic History   Marital status: Single    Spouse name: Not on file   Number of children: 0   Years of education: Not on file   Highest education level: Associate degree: occupational, Hotel manager, or vocational program  Occupational History   Not on file  Tobacco Use   Smoking status: Never   Smokeless tobacco: Never  Vaping Use   Vaping Use: Never used  Substance and Sexual Activity   Alcohol use: Not Currently    Comment: rare   Drug use: Yes    Types: Marijuana   Sexual activity: Yes    Partners: Male    Birth control/protection: Implant, Condom  Other Topics Concern   Not on file  Social History Narrative   Not on file   Social Determinants of Health   Financial Resource Strain: Not on file  Food Insecurity: Not on file  Transportation Needs: Not on file  Physical Activity: Not on file  Stress: Not on file  Social Connections: Not on file    Allergies:  Allergies  Allergen Reactions   Sulfa Antibiotics Hives   Tramadol Other (See Comments)    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    Metabolic Disorder Labs: Lab Results  Component Value Date   HGBA1C 6.0 (H) 12/07/2015   MPG 126 12/07/2015   Lab Results  Component Value Date   PROLACTIN 14.1 12/07/2015   Lab Results  Component Value Date   CHOL 321 (H)  12/07/2015   TRIG 71 12/07/2015   HDL 41 12/07/2015   CHOLHDL 7.8 12/07/2015   VLDL 14 12/07/2015   LDLCALC 266 (H) 12/07/2015   Lab Results  Component Value Date   TSH 1.265 12/07/2015    Therapeutic Level Labs: No results found for: LITHIUM No results found for: VALPROATE No components found for:  CBMZ  Current Medications: Current Outpatient Medications  Medication Sig Dispense Refill   metFORMIN (GLUCOPHAGE-XR) 500 MG 24 hr tablet Take by mouth.     valACYclovir (VALTREX) 1000 MG tablet Take by mouth.     bismuth subsalicylate (PEPTO BISMOL) 262 MG/15ML suspension Take 30 mLs by mouth every 6 (six) hours as needed for indigestion or diarrhea or loose stools.     Blood Glucose Monitoring Suppl (FIFTY50 GLUCOSE METER 2.0) w/Device KIT Use as instructed. Ok to dispense whichever glucometer is  covered by insurance E11.65     Dulaglutide 0.75 MG/0.5ML SOPN Inject into the skin.     etonogestrel (NEXPLANON) 68 MG IMPL implant 68 mg by Subdermal route once.      exenatide (BYETTA 10 MCG PEN) 10 MCG/0.04ML SOPN injection Inject 10 mcg into the skin 2 (two) times daily with a meal.     ezetimibe (ZETIA) 10 MG tablet Take 10 mg by mouth daily.     FEROSUL 325 (65 Fe) MG tablet Take 325 mg by mouth daily.     fluconazole (DIFLUCAN) 150 MG tablet Take 1 tablet (150 mg total) by mouth daily. 1 tablet 1   fluticasone (FLONASE) 50 MCG/ACT nasal spray Place into the nose.     fluticasone (FLONASE) 50 MCG/ACT nasal spray Place into both nostrils.     Fluticasone-Salmeterol (ADVAIR) 250-50 MCG/DOSE AEPB fluticasone 250 mcg-salmeterol 50 mcg/dose blistr powdr for inhalation     gabapentin (NEURONTIN) 300 MG capsule Take by mouth.     glucose blood (PRECISION QID TEST) test strip Dispense 100 blood glucose test strips, ok to sub any brand preferred by insurance/patient to match with meter, use 3x/day, dx E11.65     hydrocortisone (ANUSOL-HC) 25 MG suppository Place rectally.     Insulin Degludec  (TRESIBA FLEXTOUCH) 200 UNIT/ML SOPN Inject into the skin.     JARDIANCE 25 MG TABS tablet      ketoconazole (NIZORAL) 2 % cream Apply 1 application topically 2 (two) times daily.     letrozole (FEMARA) 2.5 MG tablet      metFORMIN (GLUCOPHAGE-XR) 500 MG 24 hr tablet TAKE 1 TABLET BY MOUTH TWICE DAILY FOR DIABETES.     methylPREDNISolone (MEDROL DOSEPAK) 4 MG TBPK tablet Take Tapered dose as directed 21 tablet 0   montelukast (SINGULAIR) 10 MG tablet TAKE 1 TABLET BY MOUTH ONCE DAILY FOR ASTHMA OR ALLERGIES.     norethindrone (AYGESTIN) 5 MG tablet Take 2.5 mg by mouth 2 (two) times daily.      nystatin cream (MYCOSTATIN) Apply 1 application topically 2 (two) times daily. Apply to external vaginal area twice a day for yeast infection. 30 g 1   omeprazole (PRILOSEC) 20 MG capsule TAKE 1 CAPSULE BY MOUTH ONCE DAILY FOR REFLUX     ondansetron (ZOFRAN) 4 MG tablet Take 4 mg by mouth every 8 (eight) hours as needed.     OZEMPIC, 0.25 OR 0.5 MG/DOSE, 2 MG/1.5ML SOPN Inject 1 mg into the skin.     PROAIR HFA 108 (90 Base) MCG/ACT inhaler Inhale 2 puffs into the lungs every 6 (six) hours as needed for wheezing or shortness of breath.      rosuvastatin (CRESTOR) 40 MG tablet TAKE 1 TABLET BY MOUTH ONCE DAILY FOR HIGH CHOLESTEROL     sertraline (ZOLOFT) 100 MG tablet Take 2 tablets (200 mg total) by mouth daily. 60 tablet 2   TRADJENTA 5 MG TABS tablet      traZODone (DESYREL) 100 MG tablet Take 1-1.5 tablets (100-150 mg total) by mouth at bedtime as needed for sleep. 45 tablet 1   TRUEPLUS PEN NEEDLES 32G X 4 MM MISC      No current facility-administered medications for this visit.     Musculoskeletal: Strength & Muscle Tone:  UTA Gait & Station:  UTA Patient leans: N/A  Psychiatric Specialty Exam: Review of Systems  Psychiatric/Behavioral:  Positive for dysphoric mood. The patient is nervous/anxious.   All other systems reviewed and are negative.  There were no vitals  taken for this  visit.There is no height or weight on file to calculate BMI.  General Appearance: Casual  Eye Contact:  Fair  Speech:  Normal Rate  Volume:  Normal  Mood:  Anxious and Depressed  Affect:  Congruent  Thought Process:  Goal Directed and Descriptions of Associations: Intact  Orientation:  Full (Time, Place, and Person)  Thought Content: Logical   Suicidal Thoughts:  No  Homicidal Thoughts:  No  Memory:  Immediate;   Fair Recent;   Fair Remote;   Fair  Judgement:  Fair  Insight:  Fair  Psychomotor Activity:  Normal  Concentration:  Concentration: Fair and Attention Span: Fair  Recall:  [employer] of Knowledge: Fair  Language: Fair  Akathisia:  No  Handed:  Right  AIMS (if indicated): not done  Assets:  Communication Skills Desire for Improvement Social Support  ADL's:  Intact  Cognition: WNL  Sleep:  Fair   Screenings: AUDIT    Flowsheet Row Admission (Discharged) from 12/06/2015 in [facility]  Alcohol Use Disorder Identification Test Final Score (AUDIT) 1      PHQ2-9    Flowsheet Row Video Visit from 08/24/2020 in [facility] Video Visit from 08/07/2020 in [facility] Video Visit from 05/01/2020 in [facility]  PHQ-2 Total Score 3 2 1   PHQ-9 Total Score 12 3 --      Flowsheet Row Video Visit from 08/24/2020 in [facility] ED from 08/22/2020 in [facility] Video Visit from 08/07/2020 in [facility] Error: Q7 should not be populated when Q6 is No No Risk Low Risk        Assessment and Plan: Houa Nie is a 25 year old African-American female who is employed, lives in [place], has a history of depression, GAD was evaluated by telemedicine today.  Patient is currently going through relationship struggles, has been noncompliant with her  medication regimen as well as psychotherapy sessions.  She will benefit from the following plan. The patient demonstrates the following risk factors for suicide: Chronic risk factors for suicide include: psychiatric disorder of depression and medical illness diabetes . Acute risk factors for suicide include: family or marital conflict. Protective factors for this patient include: positive social support and hope for the future. Considering these factors, the overall suicide risk at this point appears to be low. Patient is appropriate for outpatient follow up.   Plan MDD-unstable Zoloft 200 mg p.o. daily.  Advised patient to restart taking her medication.  GAD-unstable Restart Zoloft as prescribed Hydroxyzine 12.5-25 mg p.o. daily as needed for severe anxiety attacks Restart CBT.  Will refer patient to therapist at our practice.  Insomnia-stable Trazodone 100-200 mg p.o. nightly as needed  Noncompliance with treatment plan-provided education.  Encouraged compliance. Provided education. Discussed with patient she will need in office visits since she is currently struggling.  Follow-up in clinic in 2 weeks.  Crisis plan discussed with patient.  Provided information for [facility], calling 911, going to the emergency department.  Making use of her support system.  This note was generated in part or whole with voice recognition software. Voice recognition is usually quite accurate but there are transcription errors that can and very often do occur. I apologize for any typographical errors that were not detected and corrected.      Ursula Alert, MD 08/25/2020, 5:51 PM

## 2020-08-25 ENCOUNTER — Encounter: Payer: Self-pay | Admitting: Psychiatry

## 2020-09-07 ENCOUNTER — Other Ambulatory Visit: Payer: Self-pay

## 2020-09-07 ENCOUNTER — Encounter: Payer: Self-pay | Admitting: Psychiatry

## 2020-09-07 ENCOUNTER — Telehealth (INDEPENDENT_AMBULATORY_CARE_PROVIDER_SITE_OTHER): Payer: BC Managed Care – PPO | Admitting: Psychiatry

## 2020-09-07 DIAGNOSIS — F330 Major depressive disorder, recurrent, mild: Secondary | ICD-10-CM | POA: Diagnosis not present

## 2020-09-07 DIAGNOSIS — F411 Generalized anxiety disorder: Secondary | ICD-10-CM

## 2020-09-07 DIAGNOSIS — Z91199 Patient's noncompliance with other medical treatment and regimen due to unspecified reason: Secondary | ICD-10-CM

## 2020-09-07 DIAGNOSIS — Z9119 Patient's noncompliance with other medical treatment and regimen: Secondary | ICD-10-CM | POA: Diagnosis not present

## 2020-09-07 DIAGNOSIS — F5105 Insomnia due to other mental disorder: Secondary | ICD-10-CM | POA: Diagnosis not present

## 2020-09-07 DIAGNOSIS — F609 Personality disorder, unspecified: Secondary | ICD-10-CM

## 2020-09-07 NOTE — Progress Notes (Signed)
Virtual Visit via Video Note  I connected with Betul Brisky on 09/07/20 at  3:30 PM EDT by a video enabled telemedicine application and verified that I am speaking with the correct person using two identifiers. Location Provider Location : [facility] Patient Location : Work  Participants: Patient , Provider   I discussed the limitations of evaluation and management by telemedicine and the availability of in person appointments. The patient expressed understanding and agreed to proceed.     I discussed the assessment and treatment plan with the patient. The patient was provided an opportunity to ask questions and all were answered. The patient agreed with the plan and demonstrated an understanding of the instructions.   The patient was advised to call back or seek an in-person evaluation if the symptoms worsen or if the condition fails to improve as anticipated.   [facility] MD OP Progress Note  09/07/2020 3:56 PM Avanna Sowder  MRN:  [id number]  Chief Complaint:  Chief Complaint   Follow-up; Anxiety    HPI: Krystalyn Kubota is a 25 year old African-American female, employed, lives in [place], has a history of MDD, GAD, insomnia, type 2 diabetes melitis was evaluated by telemedicine today.  Patient today reports she is currently feeling much better.  She reports she has started taking her medications regularly.  Since doing so she does not have any significant sadness, and reports energy level and motivation has improved.  She denies any suicidality.  She denies any self-injurious thoughts or behaviors.  She reports work is going well.  Patient denies any side effects to medications.  She has upcoming appointment with Ms. Christina Hussami, she plans to keep it.  Patient denies any other concerns today.  Visit Diagnosis:    ICD-10-CM   1. MDD (major depressive disorder), recurrent episode, mild ([place])  F33.0     2. GAD (generalized anxiety disorder)  F41.1     3. Insomnia due to  mental condition  F51.05     4. Noncompliance with treatment regimen  Z91.19     5. Personality disorder ([place])  F60.9    R/O Borderline personality disorder      Past Psychiatric History: I have reviewed past psychiatric history from progress note on 02/12/2018.  Past trials of Wellbutrin, BuSpar  Past Medical History:  Past Medical History:  Diagnosis Date   Anxiety    Asthma    well controlled   Diabetes mellitus without complication ([place])    pt states she went off her meds because her numbers were fine   Endometriosis    Hidradenitis suppurativa 12/31 /2019   Pneumonia 01/2017   Refusal of blood product     Past Surgical History:  Procedure Laterality Date   ADENOIDECTOMY Bilateral 02/09/2018   Procedure: REVISION ADENOIDECTOMY;  Surgeon: Carloyn Manner, MD;  Location: [facility];  Service: ENT;  Laterality: Bilateral;   EYE SURGERY     MYRINGOTOMY WITH TUBE PLACEMENT Right 02/09/2018   Procedure: MYRINGOTOMY WITH TUBE PLACEMENT;  Surgeon: Carloyn Manner, MD;  Location: [facility];  Service: ENT;  Laterality: Right;   NASOPHARYNGOSCOPY EUSTATION TUBE BALLOON DILATION Bilateral 02/09/2018   Procedure: NASOPHARYNGOSCOPY [place];  Surgeon: Carloyn Manner, MD;  Location: [facility];  Service: ENT;  Laterality: Bilateral;   TONSILLECTOMY     TURBINATE REDUCTION Right 02/09/2018   Procedure: OUTFRACTURE RIGHT INFERIOR TURBINATE;  Surgeon: Carloyn Manner, MD;  Location: [facility];  Service: ENT;  Laterality: Right;    Family Psychiatric History: Reviewed family psychiatric history  from progress note on 02/12/2018.  Family History:  Family History  Problem Relation Age of Onset   Anxiety disorder Mother    Depression Mother    Alcohol abuse Father    Drug abuse Father    Schizophrenia Maternal Uncle     Social History: Reviewed social history from progress note on 02/12/2018 Social History   Socioeconomic History   Marital status: Single     Spouse name: Not on file   Number of children: 0   Years of education: Not on file   Highest education level: Associate degree: occupational, Hotel manager, or vocational program  Occupational History   Not on file  Tobacco Use   Smoking status: Never   Smokeless tobacco: Never  Vaping Use   Vaping Use: Never used  Substance and Sexual Activity   Alcohol use: Not Currently    Comment: rare   Drug use: Yes    Types: Marijuana   Sexual activity: Yes    Partners: Male    Birth control/protection: Implant, Condom  Other Topics Concern   Not on file  Social History Narrative   Not on file   Social Determinants of Health   Financial Resource Strain: Not on file  Food Insecurity: Not on file  Transportation Needs: Not on file  Physical Activity: Not on file  Stress: Not on file  Social Connections: Not on file    Allergies:  Allergies  Allergen Reactions   Sulfa Antibiotics Hives   Tramadol Other (See Comments)    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    Metabolic Disorder Labs: Lab Results  Component Value Date   HGBA1C 6.0 (H) 12/07/2015   MPG 126 12/07/2015   Lab Results  Component Value Date   PROLACTIN 14.1 12/07/2015   Lab Results  Component Value Date   CHOL 321 (H) 12/07/2015   TRIG 71 12/07/2015   HDL 41 12/07/2015   CHOLHDL 7.8 12/07/2015   VLDL 14 12/07/2015   LDLCALC 266 (H) 12/07/2015   Lab Results  Component Value Date   TSH 1.265 12/07/2015    Therapeutic Level Labs: No results found for: LITHIUM No results found for: VALPROATE No components found for:  CBMZ  Current Medications: Current Outpatient Medications  Medication Sig Dispense Refill   bismuth subsalicylate (PEPTO BISMOL) 262 MG/15ML suspension Take 30 mLs by mouth every 6 (six) hours as needed for indigestion or diarrhea or loose stools.     Blood Glucose Monitoring Suppl (FIFTY50 GLUCOSE METER 2.0) w/Device KIT Use as instructed. Ok to dispense whichever glucometer is covered by  insurance E11.65     Dulaglutide 0.75 MG/0.5ML SOPN Inject into the skin.     etonogestrel (NEXPLANON) 68 MG IMPL implant 68 mg by Subdermal route once.      exenatide (BYETTA 10 MCG PEN) 10 MCG/0.04ML SOPN injection Inject 10 mcg into the skin 2 (two) times daily with a meal.     ezetimibe (ZETIA) 10 MG tablet Take 10 mg by mouth daily.     FEROSUL 325 (65 Fe) MG tablet Take 325 mg by mouth daily.     fluconazole (DIFLUCAN) 150 MG tablet Take 1 tablet (150 mg total) by mouth daily. 1 tablet 1   fluticasone (FLONASE) 50 MCG/ACT nasal spray Place into the nose.     fluticasone (FLONASE) 50 MCG/ACT nasal spray Place into both nostrils.     Fluticasone-Salmeterol (ADVAIR) 250-50 MCG/DOSE AEPB fluticasone 250 mcg-salmeterol 50 mcg/dose blistr powdr for inhalation  gabapentin (NEURONTIN) 300 MG capsule Take by mouth.     glucose blood (PRECISION QID TEST) test strip Dispense 100 blood glucose test strips, ok to sub any brand preferred by insurance/patient to match with meter, use 3x/day, dx E11.65     hydrocortisone (ANUSOL-HC) 25 MG suppository Place rectally.     Insulin Degludec (TRESIBA FLEXTOUCH) 200 UNIT/ML SOPN Inject into the skin.     JARDIANCE 25 MG TABS tablet      ketoconazole (NIZORAL) 2 % cream Apply 1 application topically 2 (two) times daily.     letrozole (FEMARA) 2.5 MG tablet      metFORMIN (GLUCOPHAGE-XR) 500 MG 24 hr tablet TAKE 1 TABLET BY MOUTH TWICE DAILY FOR DIABETES.     metFORMIN (GLUCOPHAGE-XR) 500 MG 24 hr tablet Take by mouth.     methylPREDNISolone (MEDROL DOSEPAK) 4 MG TBPK tablet Take Tapered dose as directed 21 tablet 0   montelukast (SINGULAIR) 10 MG tablet TAKE 1 TABLET BY MOUTH ONCE DAILY FOR ASTHMA OR ALLERGIES.     norethindrone (AYGESTIN) 5 MG tablet Take 2.5 mg by mouth 2 (two) times daily.      nystatin cream (MYCOSTATIN) Apply 1 application topically 2 (two) times daily. Apply to external vaginal area twice a day for yeast infection. 30 g 1   omeprazole  (PRILOSEC) 20 MG capsule TAKE 1 CAPSULE BY MOUTH ONCE DAILY FOR REFLUX     ondansetron (ZOFRAN) 4 MG tablet Take 4 mg by mouth every 8 (eight) hours as needed.     OZEMPIC, 0.25 OR 0.5 MG/DOSE, 2 MG/1.5ML SOPN Inject 1 mg into the skin.     PROAIR HFA 108 (90 Base) MCG/ACT inhaler Inhale 2 puffs into the lungs every 6 (six) hours as needed for wheezing or shortness of breath.      rosuvastatin (CRESTOR) 40 MG tablet TAKE 1 TABLET BY MOUTH ONCE DAILY FOR HIGH CHOLESTEROL     sertraline (ZOLOFT) 100 MG tablet Take 2 tablets (200 mg total) by mouth daily. 60 tablet 2   TRADJENTA 5 MG TABS tablet      traZODone (DESYREL) 100 MG tablet Take 1-1.5 tablets (100-150 mg total) by mouth at bedtime as needed for sleep. 45 tablet 1   TRUEPLUS PEN NEEDLES 32G X 4 MM MISC      valACYclovir (VALTREX) 1000 MG tablet Take by mouth.     No current facility-administered medications for this visit.     Musculoskeletal: Strength & Muscle Tone:  UTA Gait & Station:  Seated Patient leans: N/A  Psychiatric Specialty Exam: Review of Systems  Psychiatric/Behavioral:  The patient is nervous/anxious.   All other systems reviewed and are negative.  There were no vitals taken for this visit.There is no height or weight on file to calculate BMI.  General Appearance: Casual  Eye Contact:  Fair  Speech:  Clear and Coherent  Volume:  Normal  Mood:  Anxious improving  Affect:  Congruent  Thought Process:  Goal Directed and Descriptions of Associations: Intact  Orientation:  Full (Time, Place, and Person)  Thought Content: Logical   Suicidal Thoughts:  No  Homicidal Thoughts:  No  Memory:  Immediate;   Fair Recent;   Fair Remote;   Fair  Judgement:  Fair  Insight:  Fair  Psychomotor Activity:  Normal  Concentration:  Concentration: Fair and Attention Span: Fair  Recall:  [employer] of Knowledge: Fair  Language: Fair  Akathisia:  No  Handed:  Right  AIMS (if indicated):  not done  Assets:  Communication  Skills Desire for Improvement Social Support  ADL's:  Intact  Cognition: WNL  Sleep:  Fair   Screenings: AUDIT    Flowsheet Row Admission (Discharged) from 12/06/2015 in [facility]  Alcohol Use Disorder Identification Test Final Score (AUDIT) 1      GAD-7    Flowsheet Row Video Visit from 09/07/2020 in [facility]  Total GAD-7 Score 1      PHQ2-9    Flowsheet Row Video Visit from 09/07/2020 in [facility] Video Visit from 08/24/2020 in [facility] Video Visit from 08/07/2020 in [facility] Video Visit from 05/01/2020 in [facility]  PHQ-2 Total Score _0 PHQ-9 Total Score _1 --      Flowsheet Row Video Visit from 08/24/2020 in [facility] from 08/22/2020 in [facility] Video Visit from 08/07/2020 in [facility] Error: Q7 should not be populated when Q6 is No No Risk Low Risk        Assessment and Plan: Pharrah Rottman is a 25 year old African-American female who is employed, lives in [place], has a history of depression, GAD was evaluated by telemedicine today.  Patient is currently compliant with her medications and reports mood is improving.  Plan  MDD in remission Zoloft 200 mg p.o. daily   GAD-improving Zoloft as prescribed Hydroxyzine 12.5-25 mg p.o. daily as needed for severe anxiety attacks Patient has been referred for psychotherapy. She has upcoming appointment with Ms. Christina Hussami  Insomnia-stable Trazodone 100-200 mg p.o. nightly as needed  Noncompliance with treatment plan-patient provided education, reports she is more compliant.  Personality disorder unspecified-rule out borderline personality disorder-she will benefit from psychotherapy  sessions.  Follow-up in clinic in 4 weeks or sooner if needed.  This note was generated in part or whole with voice recognition software. Voice recognition is usually quite accurate but there are transcription errors that can and very often do occur. I apologize for any typographical errors that were not detected and corrected.     Ursula Alert, MD 09/07/2020, 3:56 PM

## 2020-09-21 ENCOUNTER — Other Ambulatory Visit: Payer: Self-pay

## 2020-09-21 ENCOUNTER — Ambulatory Visit (INDEPENDENT_AMBULATORY_CARE_PROVIDER_SITE_OTHER): Payer: BC Managed Care – PPO | Admitting: Licensed Clinical Social Worker

## 2020-09-21 DIAGNOSIS — F411 Generalized anxiety disorder: Secondary | ICD-10-CM

## 2020-09-21 DIAGNOSIS — F330 Major depressive disorder, recurrent, mild: Secondary | ICD-10-CM
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
## 2020-10-05 ENCOUNTER — Encounter: Payer: Self-pay | Admitting: Psychiatry

## 2020-10-05 ENCOUNTER — Telehealth (INDEPENDENT_AMBULATORY_CARE_PROVIDER_SITE_OTHER): Payer: BC Managed Care – PPO | Admitting: Psychiatry

## 2020-10-05 ENCOUNTER — Other Ambulatory Visit: Payer: Self-pay

## 2020-10-05 DIAGNOSIS — Z9119 Patient's noncompliance with other medical treatment and regimen: Secondary | ICD-10-CM | POA: Diagnosis not present

## 2020-10-05 DIAGNOSIS — F411 Generalized anxiety disorder: Secondary | ICD-10-CM

## 2020-10-05 DIAGNOSIS — F3342 Major depressive disorder, recurrent, in full remission: Secondary | ICD-10-CM

## 2020-10-05 DIAGNOSIS — F5105 Insomnia due to other mental disorder: Secondary | ICD-10-CM

## 2020-10-05 DIAGNOSIS — F609 Personality disorder, unspecified: Secondary | ICD-10-CM

## 2020-10-05 DIAGNOSIS — Z91199 Patient's noncompliance with other medical treatment and regimen due to unspecified reason: Secondary | ICD-10-CM

## 2020-10-05 MED ORDER — TRAZODONE HCL 100 MG PO TABS: 100.0000 mg | ORAL_TABLET | Freq: Every evening | ORAL | 1 refills | Status: DC | PRN

## 2020-10-05 NOTE — Progress Notes (Signed)
Virtual Visit via Video Note  I connected with Shari [name] on 10/05/20 at 10:20 AM EDT by a video enabled telemedicine application and verified that I am speaking with the correct person using two identifiers.  Location Provider Location : [facility] Patient Location : Work  Participants: Patient , Provider   I discussed the limitations of evaluation and management by telemedicine and the availability of in person appointments. The patient expressed understanding and agreed to proceed.    I discussed the assessment and treatment plan with the patient. The patient was provided an opportunity to ask questions and all were answered. The patient agreed with the plan and demonstrated an understanding of the instructions.   The patient was advised to call back or seek an in-person evaluation if the symptoms worsen or if the condition fails to improve as anticipated.   BH MD/PA/NP OP Progress Note  10/05/2020 12:59 PM Shari [name]  MRN:  [id number]  Chief Complaint:  Chief Complaint   Follow-up; Depression; Anxiety    HPI: Shari [name] is a 25 year old African-American female, employed, lives in [place], has a history of MDD, GAD, insomnia, type 2 diabetes melitis was evaluated by telemedicine today.  Patient today reports she is currently recovering from viral gastroenteritis.  She is currently on a bland diet.  She had nausea and severe diarrhea.  She reports her diarrhea is improving.  The diarrhea did keep her up at night previously however last night she was able to sleep better.  Patient otherwise reports overall she is doing okay with regards to her mood.  Denies any significant sadness.  Denies any anxiety attacks.  Patient continues to work and reports work is going well.  Patient reports she stopped taking her Zoloft when she got diagnosed with gastroenteritis for a few days.  Now she is back on her medications.  She reports she felt bad when she was off of the Zoloft and  currently feels much better.  Patient had psychotherapy visit with [name], reports she is motivated to stay in therapy.  Denies any suicidal thoughts.  Denies any homicidality or perceptual disturbances.  Patient denies any other concerns today.  Visit Diagnosis:    ICD-10-CM   1. MDD (major depressive disorder), recurrent, in full remission ([place])  F33.42     2. GAD (generalized anxiety disorder)  F41.1     3. Insomnia due to mental condition  F51.05 traZODone (DESYREL) 100 MG tablet   depression, anxiety    4. Noncompliance with treatment regimen  Z91.19     5. Personality disorder ([place])  F60.9       Past Psychiatric History: Reviewed past psychiatric history from progress note on 02/12/2018.  Past trials of Wellbutrin, BuSpar  Past Medical History:  Past Medical History:  Diagnosis Date   Anxiety    Asthma    well controlled   Diabetes mellitus without complication ([place])    pt states she went off her meds because her numbers were fine   Endometriosis    Hidradenitis suppurativa 12/31 /2019   Pneumonia 01/2017   Refusal of blood product     Past Surgical History:  Procedure Laterality Date   ADENOIDECTOMY Bilateral 02/09/2018   Procedure: REVISION ADENOIDECTOMY;  Surgeon: Carloyn Manner, MD;  Location: [facility] OR[facility];  Service: ENT;  Laterality: Bilateral;   EYE SURGERY     MYRINGOTOMY WITH TUBE PLACEMENT Right 02/09/2018   Procedure: MYRINGOTOMY WITH TUBE PLACEMENT;  Surgeon: Carloyn Manner, MD;  Location: [facility];  Service: ENT;  Laterality: Right;   NASOPHARYNGOSCOPY EUSTATION TUBE BALLOON DILATION Bilateral 02/09/2018   Procedure: NASOPHARYNGOSCOPY EUSTATION TUBE BALLOON DILATION;  Surgeon: Carloyn Manner, MD;  Location: [facility];  Service: ENT;  Laterality: Bilateral;   TONSILLECTOMY     TURBINATE REDUCTION Right 02/09/2018   Procedure: OUTFRACTURE RIGHT INFERIOR TURBINATE;  Surgeon: Carloyn Manner, MD;  Location: [facility];  Service: ENT;   Laterality: Right;    Family Psychiatric History: Reviewed family psychiatric history from progress note on 02/12/2018  Family History:  Family History  Problem Relation Age of Onset   Anxiety disorder Mother    Depression Mother    Alcohol abuse Father    Drug abuse Father    Schizophrenia Maternal Uncle     Social History: Reviewed social history from progress note on 02/12/2018 Social History   Socioeconomic History   Marital status: Single    Spouse name: Not on file   Number of children: 0   Years of education: Not on file   Highest education level: Associate degree: occupational, Hotel manager, or vocational program  Occupational History   Not on file  Tobacco Use   Smoking status: Never   Smokeless tobacco: Never  Vaping Use   Vaping Use: Never used  Substance and Sexual Activity   Alcohol use: Not Currently    Comment: rare   Drug use: Yes    Types: Marijuana   Sexual activity: Yes    Partners: Male    Birth control/protection: Implant, Condom  Other Topics Concern   Not on file  Social History Narrative   Not on file   Social Determinants of Health   Financial Resource Strain: Not on file  Food Insecurity: Not on file  Transportation Needs: Not on file  Physical Activity: Not on file  Stress: Not on file  Social Connections: Not on file    Allergies:  Allergies  Allergen Reactions   Sulfa Antibiotics Hives   Tramadol Other (See Comments)    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    Metabolic Disorder Labs: Lab Results  Component Value Date   HGBA1C 6.0 (H) 12/07/2015   MPG 126 12/07/2015   Lab Results  Component Value Date   PROLACTIN 14.1 12/07/2015   Lab Results  Component Value Date   CHOL 321 (H) 12/07/2015   TRIG 71 12/07/2015   HDL 41 12/07/2015   CHOLHDL 7.8 12/07/2015   VLDL 14 12/07/2015   LDLCALC 266 (H) 12/07/2015   Lab Results  Component Value Date   TSH 1.265 12/07/2015    Therapeutic Level Labs: No results found for:  LITHIUM No results found for: VALPROATE No components found for:  CBMZ  Current Medications: Current Outpatient Medications  Medication Sig Dispense Refill   bismuth subsalicylate (PEPTO BISMOL) 262 MG/15ML suspension Take 30 mLs by mouth every 6 (six) hours as needed for indigestion or diarrhea or loose stools.     Blood Glucose Monitoring Suppl (FIFTY50 GLUCOSE METER 2.0) w/Device KIT Use as instructed. Ok to dispense whichever glucometer is covered by insurance E11.65     Dulaglutide 0.75 MG/0.5ML SOPN Inject into the skin.     etonogestrel (NEXPLANON) 68 MG IMPL implant 68 mg by Subdermal route once.      exenatide (BYETTA 10 MCG PEN) 10 MCG/0.04ML SOPN injection Inject 10 mcg into the skin 2 (two) times daily with a meal.     ezetimibe (ZETIA) 10 MG tablet Take 10 mg by mouth daily.     FEROSUL  325 (65 Fe) MG tablet Take 325 mg by mouth daily.     fluconazole (DIFLUCAN) 150 MG tablet Take 1 tablet (150 mg total) by mouth daily. 1 tablet 1   fluticasone (FLONASE) 50 MCG/ACT nasal spray Place into the nose.     fluticasone (FLONASE) 50 MCG/ACT nasal spray Place into both nostrils.     Fluticasone-Salmeterol (ADVAIR) 250-50 MCG/DOSE AEPB fluticasone 250 mcg-salmeterol 50 mcg/dose blistr powdr for inhalation     gabapentin (NEURONTIN) 300 MG capsule Take by mouth.     glucose blood (PRECISION QID TEST) test strip Dispense 100 blood glucose test strips, ok to sub any brand preferred by insurance/patient to match with meter, use 3x/day, dx E11.65     hydrocortisone (ANUSOL-HC) 25 MG suppository Place rectally.     Insulin Degludec (TRESIBA FLEXTOUCH) 200 UNIT/ML SOPN Inject into the skin.     JARDIANCE 25 MG TABS tablet      ketoconazole (NIZORAL) 2 % cream Apply 1 application topically 2 (two) times daily.     letrozole (FEMARA) 2.5 MG tablet      metFORMIN (GLUCOPHAGE-XR) 500 MG 24 hr tablet TAKE 1 TABLET BY MOUTH TWICE DAILY FOR DIABETES. (Patient not taking: Reported on 10/05/2020)      metFORMIN (GLUCOPHAGE-XR) 500 MG 24 hr tablet Take by mouth. (Patient not taking: Reported on 10/05/2020)     methylPREDNISolone (MEDROL DOSEPAK) 4 MG TBPK tablet Take Tapered dose as directed 21 tablet 0   montelukast (SINGULAIR) 10 MG tablet TAKE 1 TABLET BY MOUTH ONCE DAILY FOR ASTHMA OR ALLERGIES.     norethindrone (AYGESTIN) 5 MG tablet Take 2.5 mg by mouth 2 (two) times daily.      nystatin cream (MYCOSTATIN) Apply 1 application topically 2 (two) times daily. Apply to external vaginal area twice a day for yeast infection. 30 g 1   omeprazole (PRILOSEC) 20 MG capsule TAKE 1 CAPSULE BY MOUTH ONCE DAILY FOR REFLUX     ondansetron (ZOFRAN) 4 MG tablet Take 4 mg by mouth every 8 (eight) hours as needed.     ondansetron (ZOFRAN-ODT) 4 MG disintegrating tablet Take 4 mg by mouth every 8 (eight) hours as needed.     OZEMPIC, 0.25 OR 0.5 MG/DOSE, 2 MG/1.5ML SOPN Inject 1 mg into the skin.     PROAIR HFA 108 (90 Base) MCG/ACT inhaler Inhale 2 puffs into the lungs every 6 (six) hours as needed for wheezing or shortness of breath.      rosuvastatin (CRESTOR) 40 MG tablet TAKE 1 TABLET BY MOUTH ONCE DAILY FOR HIGH CHOLESTEROL     sertraline (ZOLOFT) 100 MG tablet Take 2 tablets (200 mg total) by mouth daily. 60 tablet 2   TRADJENTA 5 MG TABS tablet      traZODone (DESYREL) 100 MG tablet Take 1-1.5 tablets (100-150 mg total) by mouth at bedtime as needed for sleep. 45 tablet 1   TRUEPLUS PEN NEEDLES 32G X 4 MM MISC      valACYclovir (VALTREX) 1000 MG tablet Take by mouth.     No current facility-administered medications for this visit.     Musculoskeletal: Strength & Muscle Tone:  UTA Gait & Station:  Seated Patient leans: N/A  Psychiatric Specialty Exam: Review of Systems  Gastrointestinal:  Positive for diarrhea.  All other systems reviewed and are negative.  There were no vitals taken for this visit.There is no height or weight on file to calculate BMI.  General Appearance: Casual  Eye  Contact:  Fair  Speech:  Clear and Coherent  Volume:  Normal  Mood:  Anxious  Affect:  Congruent  Thought Process:  Goal Directed and Descriptions of Associations: Intact  Orientation:  Full (Time, Place, and Person)  Thought Content: Logical   Suicidal Thoughts:  No  Homicidal Thoughts:  No  Memory:  Immediate;   Fair Recent;   Fair Remote;   Fair  Judgement:  Fair  Insight:  Fair  Psychomotor Activity:  Normal  Concentration:  Concentration: Fair and Attention Span: Fair  Recall:  [employer] of Knowledge: Fair  Language: Fair  Akathisia:  No  Handed:  Right  AIMS (if indicated): done  Assets:  Communication Skills Desire for Improvement Financial Resources/Insurance Housing Social Support Talents/Skills Transportation Vocational/Educational  ADL's:  Intact  Cognition: WNL  Sleep:  restless on and off due to diarrhea   Screenings: AUDIT    Flowsheet Row Admission (Discharged) from 12/06/2015 in [facility]  Alcohol Use Disorder Identification Test Final Score (AUDIT) 1      GAD-7    Flowsheet Row Video Visit from 10/05/2020 in [facility] Counselor from 09/21/2020 in [facility] Video Visit from 09/07/2020 in [facility]  Total GAD-7 Score 3 13 1       PHQ2-9    Flowsheet Row Video Visit from 10/05/2020 in [facility] from 09/21/2020 in [facility] Video Visit from 09/07/2020 in [facility] Video Visit from 08/24/2020 in [facility] Video Visit from 08/07/2020 in [facility]  PHQ-2 Total Score 0 3 1 3 2   PHQ-9 Total Score -- 10 2 12 3       Flowsheet Row Counselor from 09/21/2020 in [facility] Video Visit from 08/24/2020 in [facility] ED from 08/22/2020  in [facility] CATEGORY Low Risk Error: Q7 should not be populated when Q6 is No No Risk        Assessment and Plan: Shari [name] is a 25 year old African-American female who is employed, lives in [place], has a history of depression, GAD was evaluated by telemedicine today.  Patient is currently recovering from viral gastroenteritis, does report sleep problems however overall reports mood symptoms are stable.  Plan as noted below.  Plan MDD in remission Zoloft 200 mg p.o. daily  GAD-improving Zoloft as prescribed Hydroxyzine 12.5-25 mg p.o. daily as needed for severe anxiety attacks Continue CBT with [name]  Insomnia-improving Patient with viral gastroenteritis had sleep problems due to diarrhea.  However reports sleep is improving. Trazodone 100-200 mg p.o. nightly as needed  Noncompliance with treatment plan-provided education.  She is more compliant.  Personality disorder unspecified-rule out borderline personality disorder-she will continue to benefit from CBT.  We will coordinate care with her therapist.  Follow-up in clinic in 1 month or sooner if needed.  This note was generated in part or whole with voice recognition software. Voice recognition is usually quite accurate but there are transcription errors that can and very often do occur. I apologize for any typographical errors that were not detected and corrected.       Ursula Alert, MD 10/05/2020, 12:59 PM

## 2020-10-11 IMAGING — CT CT TEMPORAL BONES W/O CM
3 of 6 series · 15 of 40 positions shown, 18 images · non-contrast
Comparison: None.

CLINICAL DATA: Right otorrhea with decreased hearing for 1 month.

EXAM:
CT TEMPORAL BONES WITHOUT CONTRAST
TECHNIQUE: Axial and coronal plane CT imaging of the petrous temporal bones was
performed with thin-collimation image reconstruction. No intravenous
contrast was administered. Multiplanar CT image reconstructions were
also generated.

[Series 3: ax soft temperal bones · axial · 0.33mm/px · z∈[-571,-551]mm · 2 of 30 slices shown]
[im 10/30  brain]
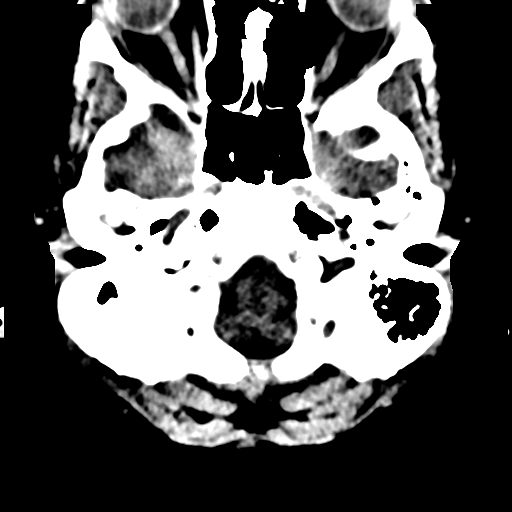
[im 20/30  brain]
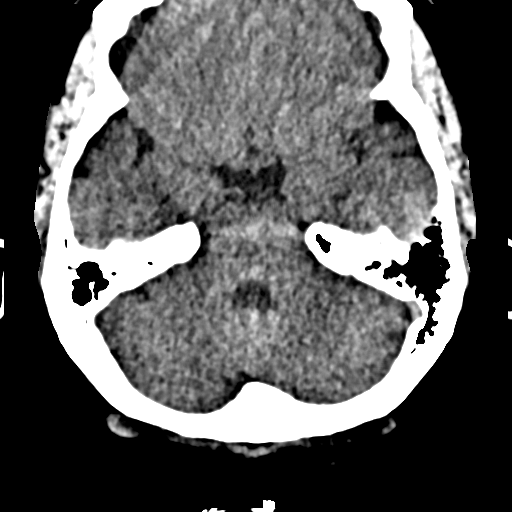

[Series 8: ax mag right temperal bones · axial · 0.20mm/px · z∈[-585,-536]mm · 11 of 100 slices shown, 14 images]
[im 9/100  brain]
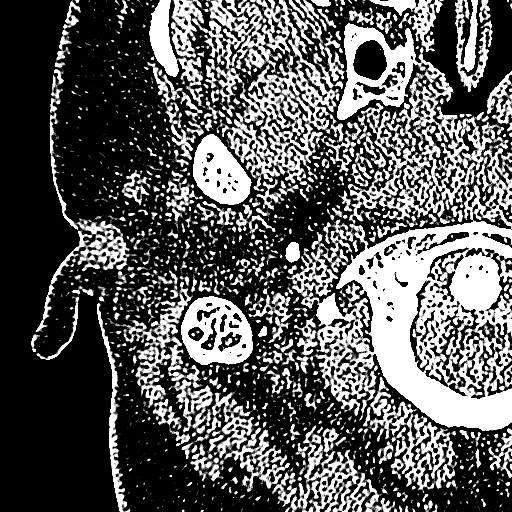
[im 9/100  bone]
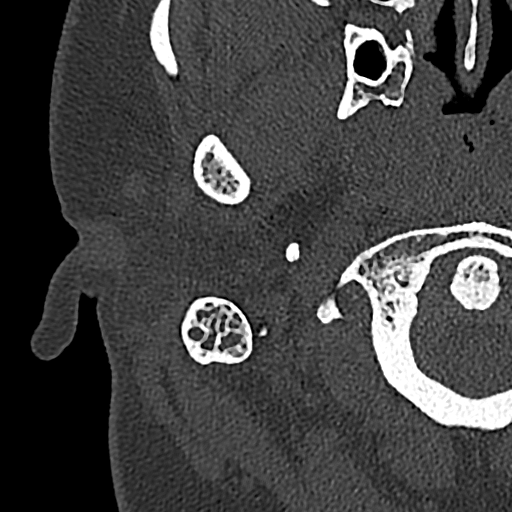
[im 17/100  bone]
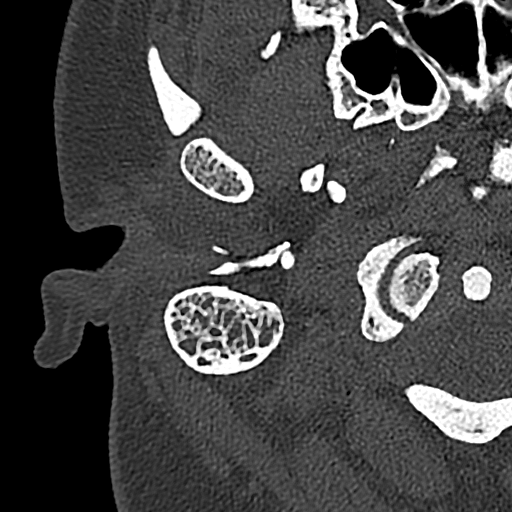
[im 25/100  bone]
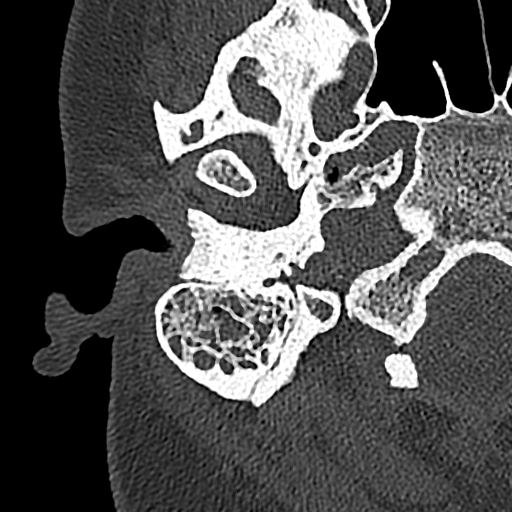
[im 34/100  bone]
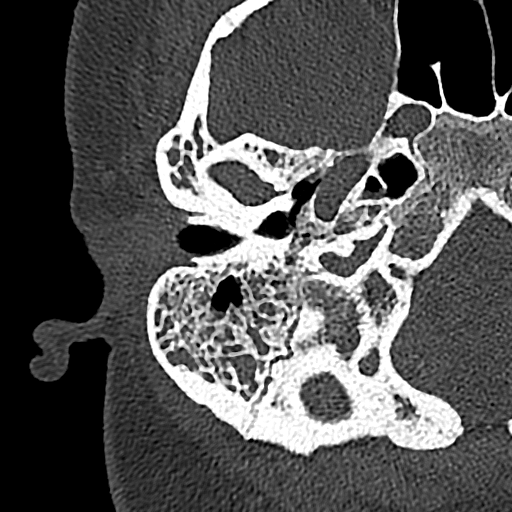
[im 42/100  brain]
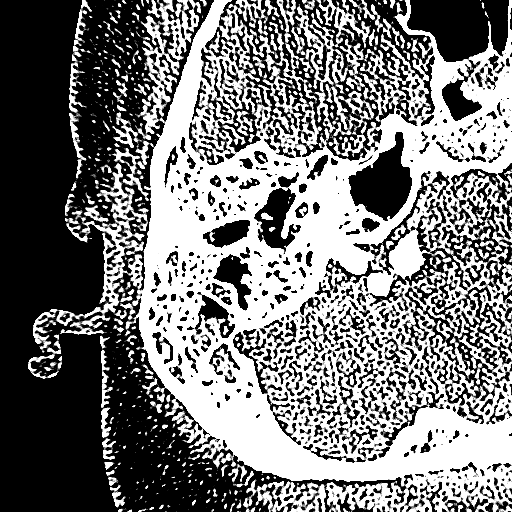
[im 42/100  bone]
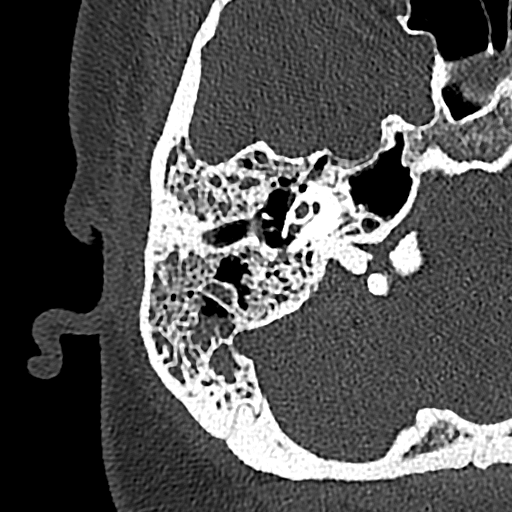
[im 50/100  bone]
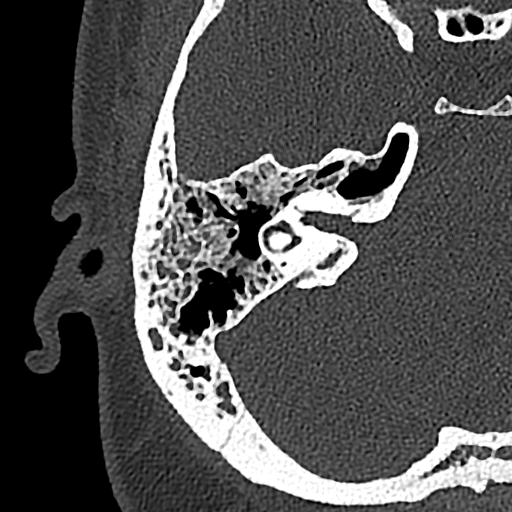
[im 58/100  bone]
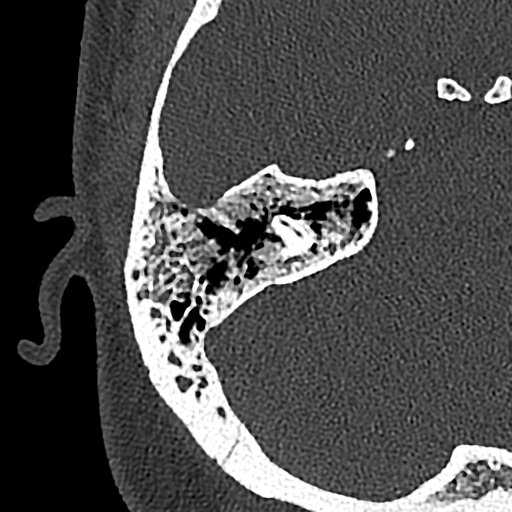
[im 67/100  bone]
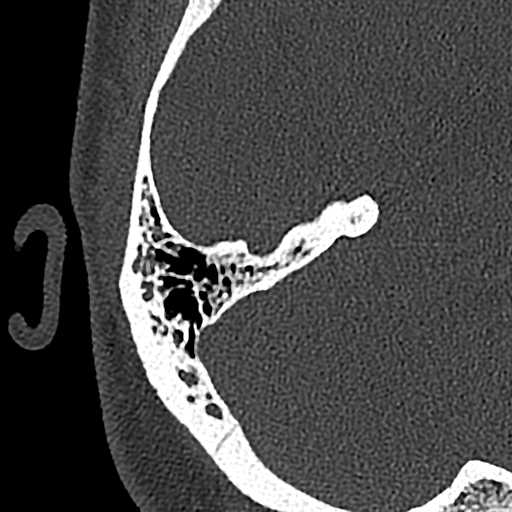
[im 75/100  brain]
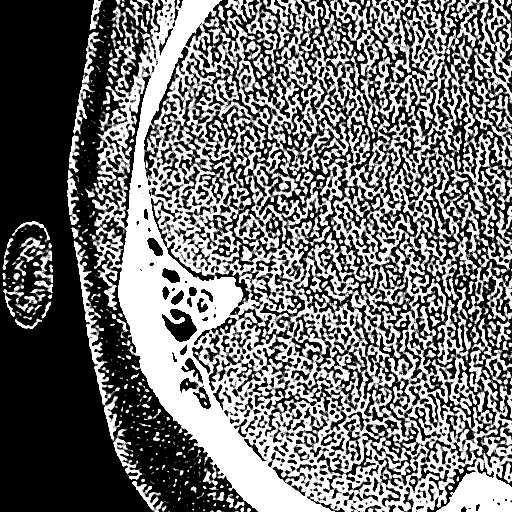
[im 75/100  bone]
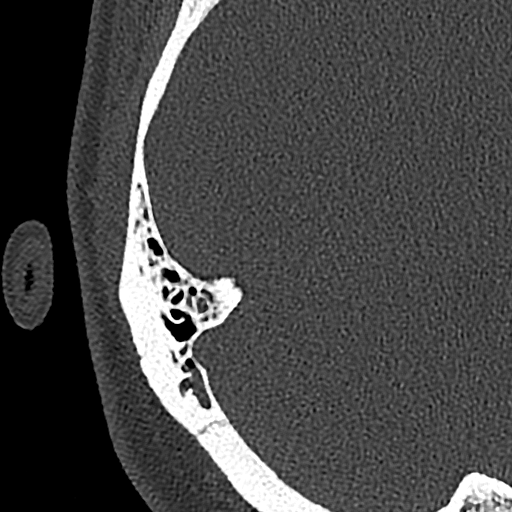
[im 83/100  bone]
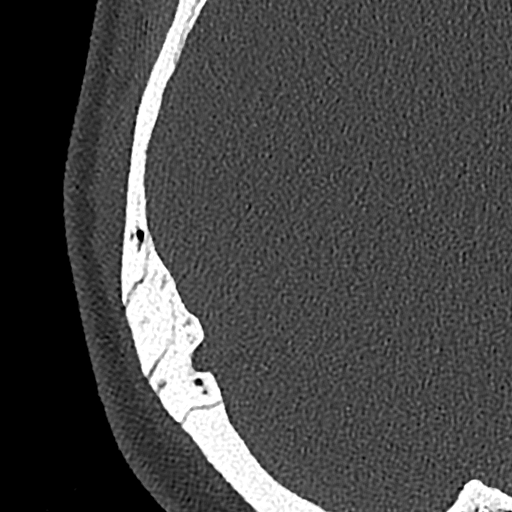
[im 91/100  bone]
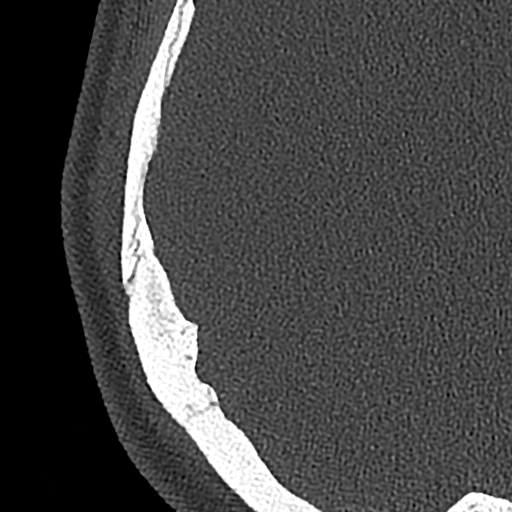

[Series 10: cor mag right temperal bones · coronal · 0.12mm/px · 2 of 125 slices shown]
[im 42/125  bone]
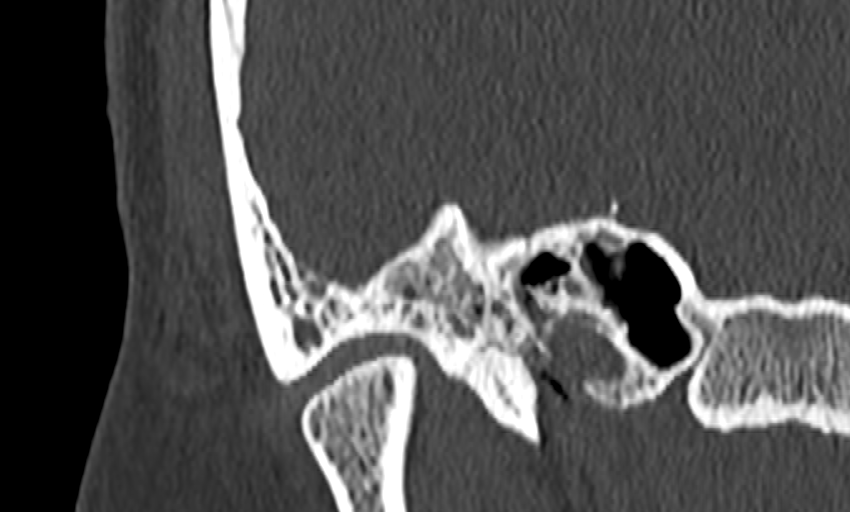
[im 83/125  bone]
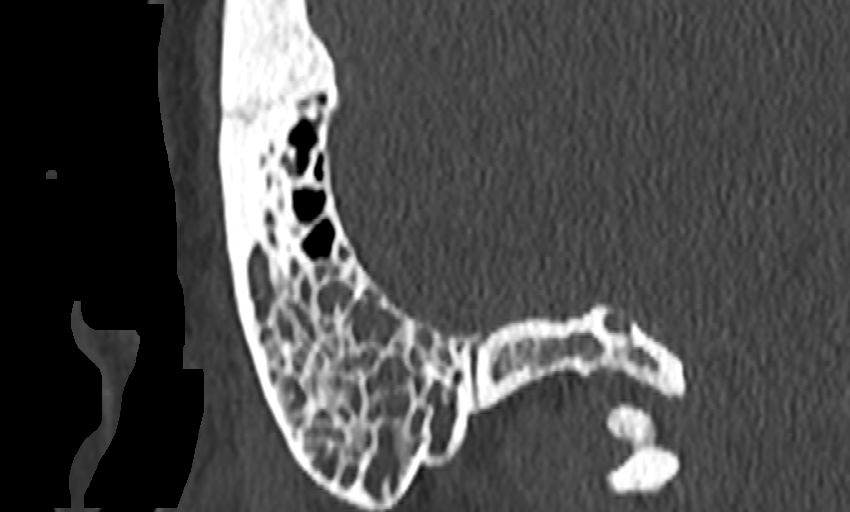

[15 of 40 positions shown; findings below may reference images not displayed]

FINDINGS: RIGHT: Small volume material in the external auditory canal abuts
the tympanic membrane. There is also a small amount of soft tissue
in the mesotympanum and epitympanum without associated osseous
erosion. The ossicles appear intact. The internal auditory canal,
cochlea, vestibule, and semicircular canals are unremarkable. There
is a large mastoid effusion without air cell coalescence.

LEFT: The external auditory canal is unremarkable. The ossicles
appear intact. The tympanic cavity is clear. The internal auditory
canal, cochlea, vestibule, and semicircular canals are unremarkable.
The mastoid air cells are clear.

There is minimal scattered mucosal thickening in the included
portions of the paranasal sinuses. The visualized portion of the
brain is unremarkable. Visualized orbits are unremarkable.
IMPRESSION: 1. Large right mastoid effusion without air cell coalescence.
2. Small amount of soft tissue in the right middle ear without
osseous erosion, likely inflammatory.
3. Unremarkable left temporal bone.

## 2020-10-24 ENCOUNTER — Other Ambulatory Visit: Payer: Self-pay

## 2020-10-24 ENCOUNTER — Ambulatory Visit (INDEPENDENT_AMBULATORY_CARE_PROVIDER_SITE_OTHER): Payer: BC Managed Care – PPO | Admitting: Licensed Clinical Social Worker

## 2020-10-24 DIAGNOSIS — F3342 Major depressive disorder, recurrent, in full remission: Secondary | ICD-10-CM

## 2020-10-24 DIAGNOSIS — F411 Generalized anxiety disorder: Secondary | ICD-10-CM | POA: Diagnosis not present

## 2020-10-24 NOTE — Progress Notes (Signed)
Virtual Visit via Audio Note  I connected with Shari [name] on 10/24/20 at  9:00 AM EDT by an audio enabled telemedicine application and verified that I am speaking with the correct person using two identifiers.  Location: Patient: home Provider: remote office [place], [place])   I discussed the limitations of evaluation and management by telemedicine and the availability of in person appointments. The patient expressed understanding and agreed to proceed.   I discussed the assessment and treatment plan with the patient. The patient was provided an opportunity to ask questions and all were answered. The patient agreed with the plan and demonstrated an understanding of the instructions.   The patient was advised to call back or seek an in-person evaluation if the symptoms worsen or if the condition fails to improve as anticipated.  I provided 50 minutes of non-face-to-face time during this encounter.   Aya Geisel R Kellie Murrill, LCSW   THERAPIST PROGRESS NOTE  Session Time: 9-9:50a  Participation Level: Active  Behavioral Response: NAAlertDepressed  Type of Therapy: Individual Therapy  Treatment Goals addressed: Problem: Decrease depressive symptoms and improve levels of effective functioning Goal: LTG: Reduce frequency, intensity, and duration of depression symptoms as evidenced by: SSB input needed on appropriate metric--pt self report Outcome: Progressing Goal: STG: @PREFFIRSTNAME @ will participate in at least 80% of scheduled individual psychotherapy sessions Outcome: Progressing  Interventions:  Intervention: Work with @PREFFIRSTNAME @ to identify the major components of a recent episode of depression: physical symptoms, major thoughts and images, and major behaviors they experienced Intervention: Review PLEASE Skills (Treat Physical Illness, Balance Eating, Avoid Mood-Altering Substances, Balance Sleep and Get Exercise) with @PREFFIRSTNAME @ Intervention: Work with patient to  identify the major components of a recent episode of anxiety: physical symptoms, major thoughts and images, and major behaviors they experienced Intervention: Assist with coping skills and behavior  Summary: Shari [name] is a 25 y.o. female who presents with improving symptoms related to depression and anxiety .   Allowed pt to explore and express thoughts and feelings associated with recent life situations and external stressors. Pt reports that she was triggered recently by hairdresser that kept cancelling appointments at the last minute and making pt go and purchase necessary supplies. Gave pt safe space to explore feelings associated with this.   Discussed pts relationship with grandmother and mother. Pt expressed desire to move into her own place of residence soon.    Discussed work stressors. Reviewed stress management, depression management, and anxiety management skills.   Continued recommendations are as follows: self care behaviors, positive social engagements, focusing on overall work/home/life balance, and focusing on positive physical and emotional wellness.   Continued recommendations are as follows: self care behaviors, positive social engagements, focusing on overall work/home/life balance, and focusing on positive physical and emotional wellness.    Suicidal/Homicidal: No  Therapist Response: Pt is continuing to apply interventions learned in session into daily life situations. Pt is currently on track to meet goals utilizing interventions mentioned above. Personal growth and progress noted. Treatment to continue as indicated.    Plan: Return again in 4 weeks.  Diagnosis: Axis I: MDD, recurrent; GAD    Axis II: No diagnosis    Rachel Bo Ebonee Stober, LCSW 10/24/2020

## 2020-10-24 NOTE — Plan of Care (Signed)
  Problem: Decrease depressive symptoms and improve levels of effective functioning Goal: LTG: Reduce frequency, intensity, and duration of depression symptoms as evidenced by: SSB input needed on appropriate metric--pt self report Outcome: Progressing Goal: STG: @PREFFIRSTNAME @ will participate in at least 80% of scheduled individual psychotherapy sessions Outcome: Progressing Intervention: Work with @PREFFIRSTNAME @ to identify the major components of a recent episode of depression: physical symptoms, major thoughts and images, and major behaviors they experienced Intervention: Review PLEASE Skills (Treat Physical Illness, Balance Eating, Avoid Mood-Altering Substances, Balance Sleep and Get Exercise) with @PREFFIRSTNAME @ Intervention: Work with patient to identify the major components of a recent episode of anxiety: physical symptoms, major thoughts and images, and major behaviors they experienced Intervention: Assist with coping skills and behavior

## 2020-11-06 ENCOUNTER — Telehealth (INDEPENDENT_AMBULATORY_CARE_PROVIDER_SITE_OTHER): Payer: Self-pay | Admitting: Psychiatry

## 2020-11-06 ENCOUNTER — Other Ambulatory Visit: Payer: Self-pay

## 2020-11-06 DIAGNOSIS — Z91199 Patient's noncompliance with other medical treatment and regimen due to unspecified reason: Secondary | ICD-10-CM

## 2020-11-06 NOTE — Progress Notes (Signed)
Patient did not respond to call or text message.  Patient contacted the clinic later in the appointment and advised staff to reschedule this appointment.

## 2020-11-09 ENCOUNTER — Encounter: Payer: Self-pay | Admitting: Psychiatry

## 2020-11-09 ENCOUNTER — Other Ambulatory Visit: Payer: Self-pay

## 2020-11-09 ENCOUNTER — Telehealth (INDEPENDENT_AMBULATORY_CARE_PROVIDER_SITE_OTHER): Payer: BC Managed Care – PPO | Admitting: Psychiatry

## 2020-11-09 DIAGNOSIS — F411 Generalized anxiety disorder: Secondary | ICD-10-CM

## 2020-11-09 DIAGNOSIS — F3342 Major depressive disorder, recurrent, in full remission: Secondary | ICD-10-CM

## 2020-11-09 DIAGNOSIS — F5105 Insomnia due to other mental disorder: Secondary | ICD-10-CM

## 2020-11-09 DIAGNOSIS — Z91199 Patient's noncompliance with other medical treatment and regimen due to unspecified reason: Secondary | ICD-10-CM | POA: Diagnosis not present

## 2020-11-09 DIAGNOSIS — F609 Personality disorder, unspecified: Secondary | ICD-10-CM

## 2020-11-09 MED ORDER — TRAZODONE HCL 100 MG PO TABS: 100.0000 mg | ORAL_TABLET | Freq: Every evening | ORAL | 2 refills | Status: DC | PRN

## 2020-11-09 MED ORDER — SERTRALINE HCL 100 MG PO TABS: 200.0000 mg | ORAL_TABLET | Freq: Every day | ORAL | 2 refills | Status: DC

## 2020-11-09 NOTE — Progress Notes (Signed)
Virtual Visit via Video Note  I connected with Shari [name] on 11/09/20 at  1:30 PM EDT by a video enabled telemedicine application and verified that I am speaking with the correct person using two identifiers.  Location Provider Location : [facility] Patient Location : Car  Participants: Patient , Provider    I discussed the limitations of evaluation and management by telemedicine and the availability of in person appointments. The patient expressed understanding and agreed to proceed.   I discussed the assessment and treatment plan with the patient. The patient was provided an opportunity to ask questions and all were answered. The patient agreed with the plan and demonstrated an understanding of the instructions.   The patient was advised to call back or seek an in-person evaluation if the symptoms worsen or if the condition fails to improve as anticipated.   [place] MD OP Progress Note  11/09/2020 1:45 PM Shari [name]  MRN:  [id number]  Chief Complaint:  Chief Complaint   Follow-up; Depression; Anxiety    HPI: Shari [name] is a 25 year old African-American female, employed, lives in [place], has a history of MDD, GAD, insomnia, type 2 diabetes melitis was evaluated by telemedicine today.  Patient today reports she has recovered completely from viral gastroenteritis as well as COVID-19.  She is currently back at work.  She reports she got a transfer to a new location with her company and currently works in [employer].  She likes this since it is very close to home.  Patient does report situational anxiety however overall has been coping okay.  Denies any depression.  Patient is compliant on medications.  Denies side effects.  She denies any suicidality, homicidality or perceptual disturbances.  She is planning to take a trip to [place] with her family on a cruise.  She looks forward to that.  Patient denies any other concerns today.  Visit Diagnosis:    ICD-10-CM   1. MDD  (major depressive disorder), recurrent, in full remission ([place])  F33.42     2. GAD (generalized anxiety disorder)  F41.1     3. Insomnia due to mental condition  F51.05    depression, anxiety    4. Noncompliance with treatment regimen  Z91.199     5. Personality disorder ([place])  F60.9       Past Psychiatric History: Reviewed past psychiatric history from progress note on 02/12/2018.  Past trials of Wellbutrin, BuSpar.  Past Medical History:  Past Medical History:  Diagnosis Date   Anxiety    Asthma    well controlled   Diabetes mellitus without complication ([place])    pt states she went off her meds because her numbers were fine   Endometriosis    Hidradenitis suppurativa 12/31 /2019   Pneumonia 01/2017   Refusal of blood product     Past Surgical History:  Procedure Laterality Date   ADENOIDECTOMY Bilateral 02/09/2018   Procedure: REVISION ADENOIDECTOMY;  Surgeon: Carloyn Manner, MD;  Location: [facility];  Service: ENT;  Laterality: Bilateral;   EYE SURGERY     MYRINGOTOMY WITH TUBE PLACEMENT Right 02/09/2018   Procedure: MYRINGOTOMY WITH TUBE PLACEMENT;  Surgeon: Carloyn Manner, MD;  Location: [facility];  Service: ENT;  Laterality: Right;   NASOPHARYNGOSCOPY EUSTATION TUBE BALLOON DILATION Bilateral 02/09/2018   Procedure: NASOPHARYNGOSCOPY [place];  Surgeon: Carloyn Manner, MD;  Location: [facility];  Service: ENT;  Laterality: Bilateral;   TONSILLECTOMY     TURBINATE REDUCTION Right 02/09/2018   Procedure: OUTFRACTURE RIGHT INFERIOR  TURBINATE;  Surgeon: Carloyn Manner, MD;  Location: [facility];  Service: ENT;  Laterality: Right;    Family Psychiatric History: Reviewed family psychiatric history from progress note on 02/12/2018  Family History:  Family History  Problem Relation Age of Onset   Anxiety disorder Mother    Depression Mother    Alcohol abuse Father    Drug abuse Father    Schizophrenia Maternal Uncle     Social History:  Reviewed social history from progress note on 02/12/2018 Social History   Socioeconomic History   Marital status: Single    Spouse name: Not on file   Number of children: 0   Years of education: Not on file   Highest education level: Associate degree: occupational, Hotel manager, or vocational program  Occupational History   Not on file  Tobacco Use   Smoking status: Never   Smokeless tobacco: Never  Vaping Use   Vaping Use: Never used  Substance and Sexual Activity   Alcohol use: Not Currently    Comment: rare   Drug use: Yes    Types: Marijuana   Sexual activity: Yes    Partners: Male    Birth control/protection: Implant, Condom  Other Topics Concern   Not on file  Social History Narrative   Not on file   Social Determinants of Health   Financial Resource Strain: Not on file  Food Insecurity: Not on file  Transportation Needs: Not on file  Physical Activity: Not on file  Stress: Not on file  Social Connections: Not on file    Allergies:  Allergies  Allergen Reactions   Sulfa Antibiotics Hives   Tramadol Other (See Comments)    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    Metabolic Disorder Labs: Lab Results  Component Value Date   HGBA1C 6.0 (H) 12/07/2015   MPG 126 12/07/2015   Lab Results  Component Value Date   PROLACTIN 14.1 12/07/2015   Lab Results  Component Value Date   CHOL 321 (H) 12/07/2015   TRIG 71 12/07/2015   HDL 41 12/07/2015   CHOLHDL 7.8 12/07/2015   VLDL 14 12/07/2015   LDLCALC 266 (H) 12/07/2015   Lab Results  Component Value Date   TSH 1.265 12/07/2015    Therapeutic Level Labs: No results found for: LITHIUM No results found for: VALPROATE No components found for:  CBMZ  Current Medications: Current Outpatient Medications  Medication Sig Dispense Refill   bismuth subsalicylate (PEPTO BISMOL) 262 MG/15ML suspension Take 30 mLs by mouth every 6 (six) hours as needed for indigestion or diarrhea or loose stools.     Blood Glucose  Monitoring Suppl (FIFTY50 GLUCOSE METER 2.0) w/Device KIT Use as instructed. Ok to dispense whichever glucometer is covered by insurance E11.65     Dulaglutide 0.75 MG/0.5ML SOPN Inject into the skin.     etonogestrel (NEXPLANON) 68 MG IMPL implant 68 mg by Subdermal route once.      exenatide (BYETTA 10 MCG PEN) 10 MCG/0.04ML SOPN injection Inject 10 mcg into the skin 2 (two) times daily with a meal.     ezetimibe (ZETIA) 10 MG tablet Take 10 mg by mouth daily.     FEROSUL 325 (65 Fe) MG tablet Take 325 mg by mouth daily.     fluconazole (DIFLUCAN) 150 MG tablet Take 1 tablet (150 mg total) by mouth daily. 1 tablet 1   fluticasone (FLONASE) 50 MCG/ACT nasal spray Place into the nose.     fluticasone (FLONASE) 50 MCG/ACT nasal spray  Place into both nostrils.     Fluticasone-Salmeterol (ADVAIR) 250-50 MCG/DOSE AEPB fluticasone 250 mcg-salmeterol 50 mcg/dose blistr powdr for inhalation     gabapentin (NEURONTIN) 300 MG capsule Take by mouth.     glucose blood (PRECISION QID TEST) test strip Dispense 100 blood glucose test strips, ok to sub any brand preferred by insurance/patient to match with meter, use 3x/day, dx E11.65     hydrocortisone (ANUSOL-HC) 25 MG suppository Place rectally.     Insulin Degludec (TRESIBA FLEXTOUCH) 200 UNIT/ML SOPN Inject into the skin.     JARDIANCE 25 MG TABS tablet      ketoconazole (NIZORAL) 2 % cream Apply 1 application topically 2 (two) times daily.     letrozole (FEMARA) 2.5 MG tablet      metFORMIN (GLUCOPHAGE-XR) 500 MG 24 hr tablet TAKE 1 TABLET BY MOUTH TWICE DAILY FOR DIABETES. (Patient not taking: Reported on 10/05/2020)     metFORMIN (GLUCOPHAGE-XR) 500 MG 24 hr tablet Take by mouth. (Patient not taking: Reported on 10/05/2020)     methylPREDNISolone (MEDROL DOSEPAK) 4 MG TBPK tablet Take Tapered dose as directed 21 tablet 0   montelukast (SINGULAIR) 10 MG tablet TAKE 1 TABLET BY MOUTH ONCE DAILY FOR ASTHMA OR ALLERGIES.     norethindrone (AYGESTIN) 5 MG  tablet Take 2.5 mg by mouth 2 (two) times daily.      nystatin cream (MYCOSTATIN) Apply 1 application topically 2 (two) times daily. Apply to external vaginal area twice a day for yeast infection. 30 g 1   omeprazole (PRILOSEC) 20 MG capsule TAKE 1 CAPSULE BY MOUTH ONCE DAILY FOR REFLUX     ondansetron (ZOFRAN) 4 MG tablet Take 4 mg by mouth every 8 (eight) hours as needed.     ondansetron (ZOFRAN-ODT) 4 MG disintegrating tablet Take 4 mg by mouth every 8 (eight) hours as needed.     OZEMPIC, 0.25 OR 0.5 MG/DOSE, 2 MG/1.5ML SOPN Inject 1 mg into the skin.     PROAIR HFA 108 (90 Base) MCG/ACT inhaler Inhale 2 puffs into the lungs every 6 (six) hours as needed for wheezing or shortness of breath.      rosuvastatin (CRESTOR) 40 MG tablet TAKE 1 TABLET BY MOUTH ONCE DAILY FOR HIGH CHOLESTEROL     sertraline (ZOLOFT) 100 MG tablet Take 2 tablets (200 mg total) by mouth daily. 60 tablet 2   TRADJENTA 5 MG TABS tablet      traZODone (DESYREL) 100 MG tablet Take 1-1.5 tablets (100-150 mg total) by mouth at bedtime as needed for sleep. 45 tablet 1   TRUEPLUS PEN NEEDLES 32G X 4 MM MISC      valACYclovir (VALTREX) 1000 MG tablet Take by mouth.     No current facility-administered medications for this visit.     Musculoskeletal: Strength & Muscle Tone:  UTA Gait & Station:  Seated Patient leans: N/A  Psychiatric Specialty Exam: Review of Systems  Psychiatric/Behavioral:  The patient is nervous/anxious.   All other systems reviewed and are negative.  There were no vitals taken for this visit.There is no height or weight on file to calculate BMI.  General Appearance: Casual  Eye Contact:  Fair  Speech:  Clear and Coherent  Volume:  Normal  Mood:  Anxious  Affect:  Congruent  Thought Process:  Goal Directed and Descriptions of Associations: Intact  Orientation:  Full (Time, Place, and Person)  Thought Content: Logical   Suicidal Thoughts:  No  Homicidal Thoughts:  No  Memory:  Immediate;    Fair Recent;   Fair Remote;   Fair  Judgement:  Fair  Insight:  Fair  Psychomotor Activity:  Normal  Concentration:  Concentration: Fair and Attention Span: Fair  Recall:  [employer] of Knowledge: Fair  Language: Fair  Akathisia:  No  Handed:  Right  AIMS (if indicated): done  Assets:  Communication Skills Desire for Improvement Housing Social Support  ADL's:  Intact  Cognition: WNL  Sleep:  Fair   Screenings: AUDIT    Flowsheet Row Admission (Discharged) from 12/06/2015 in [facility]  Alcohol Use Disorder Identification Test Final Score (AUDIT) 1      GAD-7    Flowsheet Row Video Visit from 10/05/2020 in [facility] Counselor from 09/21/2020 in [facility] Video Visit from 09/07/2020 in [facility]  Total GAD-7 Score 3 13 1       PHQ2-9    Flowsheet Row Video Visit from 10/05/2020 in [facility] from 09/21/2020 in [facility] Video Visit from 09/07/2020 in [facility] Video Visit from 08/24/2020 in [facility] Video Visit from 08/07/2020 in [facility]  PHQ-2 Total Score 0 3 1 3 2   PHQ-9 Total Score -- 10 2 12 3       Flowsheet Row Counselor from 09/21/2020 in [place] Video Visit from 08/24/2020 in [facility] from 08/22/2020 in [facility] RISK CATEGORY Low Risk Error: Q7 should not be populated when Q6 is No No Risk        Assessment and Plan: Cyntha Brickman is a 25 year old African-American female who is employed, lives in [place], has a history of depression, GAD was evaluated by telemedicine today.  Patient is currently stable.  Plan as noted below.  Plan MDD in remission Zoloft 200 mg p.o.  daily AIMS - 0  GAD-stable Zoloft 200 mg p.o. daily Hydroxyzine 12.5-25 mg p.o. daily as needed for severe anxiety attacks Continue CBT with Ms. Christina Hussami  Insomnia-improving Trazodone 100-200 mg p.o. nightly as needed  Noncompliance with treatment plan-patient is more compliant.  We will monitor closely  Personality disorder unspecified-rule out borderline personality-she will continue CBT.  Follow-up in clinic in 8 weeks or sooner if needed.  This note was generated in part or whole with voice recognition software. Voice recognition is usually quite accurate but there are transcription errors that can and very often do occur. I apologize for any typographical errors that were not detected and corrected.       Ursula Alert, MD 11/09/2020, 1:45 PM

## 2020-11-22 ENCOUNTER — Other Ambulatory Visit: Payer: Self-pay

## 2020-11-22 ENCOUNTER — Ambulatory Visit (INDEPENDENT_AMBULATORY_CARE_PROVIDER_SITE_OTHER): Payer: BC Managed Care – PPO | Admitting: Licensed Clinical Social Worker

## 2020-11-22 DIAGNOSIS — F411 Generalized anxiety disorder: Secondary | ICD-10-CM | POA: Diagnosis not present

## 2020-11-22 DIAGNOSIS — F330 Major depressive disorder, recurrent, mild: Secondary | ICD-10-CM

## 2020-11-22 NOTE — Progress Notes (Signed)
Virtual Visit via Audio Note  I connected with Shari [name] on 11/22/20 at  8:00 AM EDT by an audio enabled telemedicine application and verified that I am speaking with the correct person using two identifiers.  Location: Patient: home Provider: [facility]   I discussed the limitations of evaluation and management by telemedicine and the availability of in person appointments. The patient expressed understanding and agreed to proceed.   I discussed the assessment and treatment plan with the patient. The patient was provided an opportunity to ask questions and all were answered. The patient agreed with the plan and demonstrated an understanding of the instructions.   The patient was advised to call back or seek an in-person evaluation if the symptoms worsen or if the condition fails to improve as anticipated.  I provided 30 minutes of non-face-to-face time during this encounter.   [place], LCSW   THERAPIST PROGRESS NOTE  Session Time: 8-830a  Participation Level: Active  Behavioral Response: NAAlertAnxious  Type of Therapy: Individual Therapy  Treatment Goals addressed:  Goal: LTG: Reduce frequency, intensity, and duration of depression symptoms as evidenced by: SSB input needed on appropriate metric--pt self report Outcome: Not Progressing Note: Pt having an increase of symptoms due to external stressors (recent breakup)  Goal: STG: @PREFFIRSTNAME @ will participate in at least 80% of scheduled individual psychotherapy sessions Outcome: Progressing Note: Pt on time and engaged well throughout session  Interventions:  Intervention: Encourage new environment or opportunities for social interaction  Intervention: Work with patient to identify the major components of a recent episode of anxiety: physical symptoms, major thoughts and images, and major behaviors they experienced  Intervention: [place] OF  DEPRESSION: PHYSICAL SYMPTOMS, MAJOR THOUGHTS AND IMAGES, [place]  Intervention: Assist with coping skills and behavior  Intervention: PROVIDE [place] EDUCATION ON COMMUNICATION PATTERNS IN RELATIONSHIPS  Summary: Shari [name] is a 25 y.o. female who presents with symptoms consistent with depression and mild anxiety. Pt reports that overall mood has been fluctuating recently due to several external stressors.   Allowed pt to explore and express thoughts and feelings associated with recent life situations and external stressors. Discussed recent cruise pt went on, relationships with family members, feelings associated with recent breakup, and moving into a new office at work/job stress.  Reviewed coping skills and communication strategies. Encouraged pt to take time to recover and focus on self care.  Continued recommendations are as follows: self care behaviors, positive social engagements, focusing on overall work/home/life balance, and focusing on positive physical and emotional wellness.   Suicidal/Homicidal: No  Therapist Response: Pt is experiencing a temporary increase in depression/anxiety symptoms due to recent acute stressor (breakup with boyfriend). Progress fluctuating/intermittent at time of session.   Plan: Return again in 4 weeks.  Diagnosis: Axis I: MDD, recurrent    Axis II: No diagnosis    Rachel Bo Jailey Booton, LCSW 11/22/2020

## 2020-11-22 NOTE — Plan of Care (Signed)
  Problem: Decrease depressive symptoms and improve levels of effective functioning Goal: LTG: Reduce frequency, intensity, and duration of depression symptoms as evidenced by: SSB input needed on appropriate metric--pt self report Outcome: Not Progressing Note: Pt having an increase of symptoms due to external stressors (recent breakup) Goal: STG: @PREFFIRSTNAME @ will participate in at least 80% of scheduled individual psychotherapy sessions Outcome: Progressing Note: Pt on time and engaged well throughout session Intervention: Encourage new environment or opportunities for social interaction Intervention: Work with patient to identify the major components of a recent episode of anxiety: physical symptoms, major thoughts and images, and major behaviors they experienced Intervention: [place] OF A RECENT EPISODE OF DEPRESSION: PHYSICAL SYMPTOMS, MAJOR THOUGHTS AND IMAGES, AND MAJOR BEHAVIORS THEY EXPERIENCED Intervention: Assist with coping skills and behavior Intervention: PROVIDE [place] EDUCATION ON COMMUNICATION PATTERNS IN RELATIONSHIPS

## 2020-12-15 IMAGING — CT CT ABDOMEN AND PELVIS WITH CONTRAST
2 of 4 series · 17 of 46 positions shown, 19 images · IV contrast (APPLIED)
Comparison: CT scan of June 16, 2017.

CLINICAL DATA: Nausea, vomiting, generalized abdominal pain.

EXAM:
CT ABDOMEN AND PELVIS WITH CONTRAST
TECHNIQUE: Multidetector CT imaging of the abdomen and pelvis was performed
using the standard protocol following bolus administration of
intravenous contrast.
CONTRAST:  125mL OMNIPAQUE IOHEXOL 300 MG/ML  SOLN

[Series 2: routine abd/pel with · axial · 0.81mm/px · z∈[-487,-47]mm · 14 of 96 slices shown, 16 images]
[im 4/96  soft-tissue]
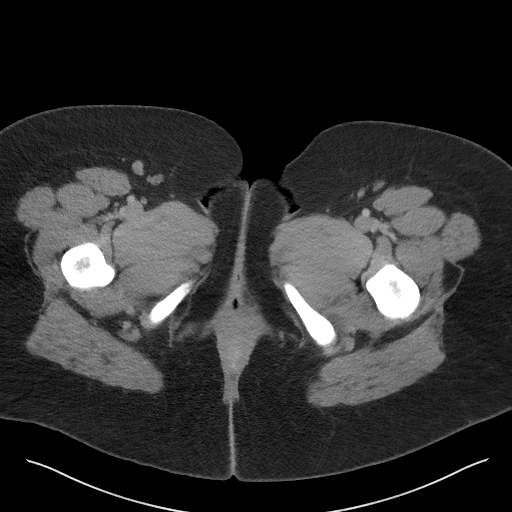
[im 4/96  bone]
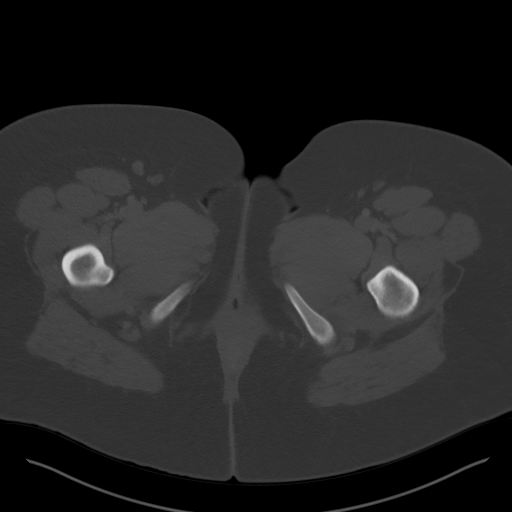
[im 12/96  soft-tissue]
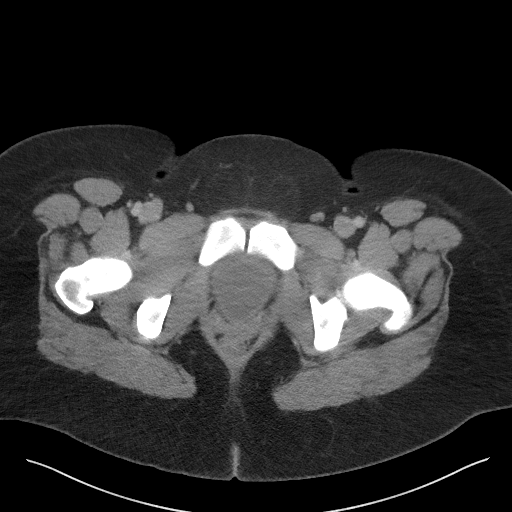
[im 20/96  soft-tissue]
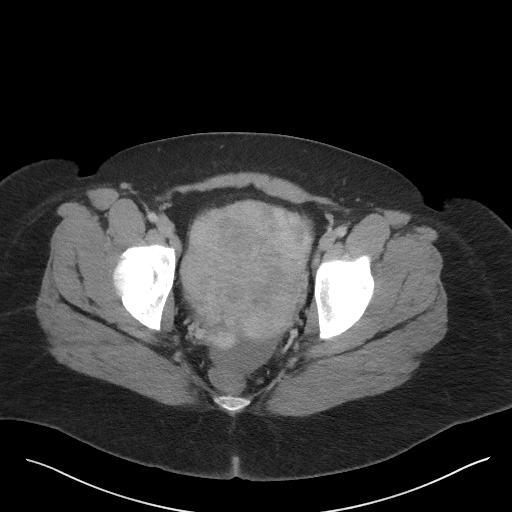
[im 24/96  soft-tissue]
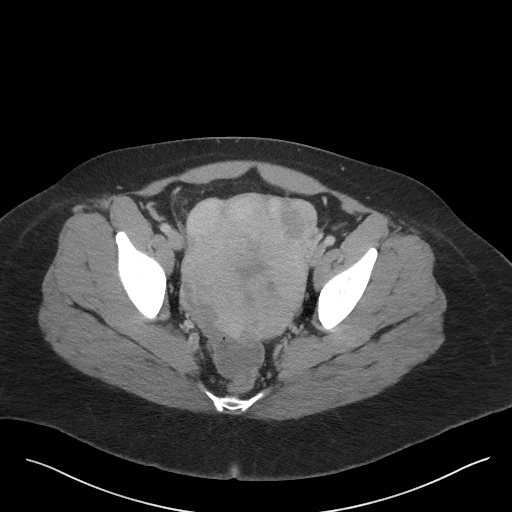
[im 32/96  soft-tissue]
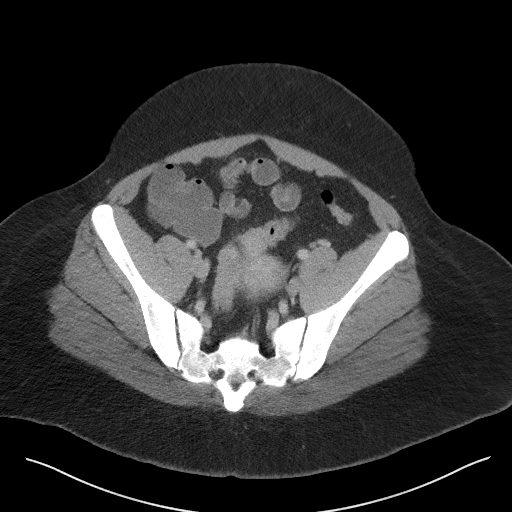
[im 40/96  soft-tissue]
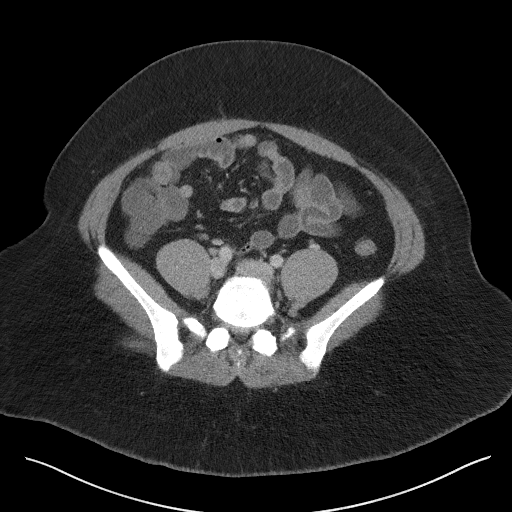
[im 44/96  soft-tissue]
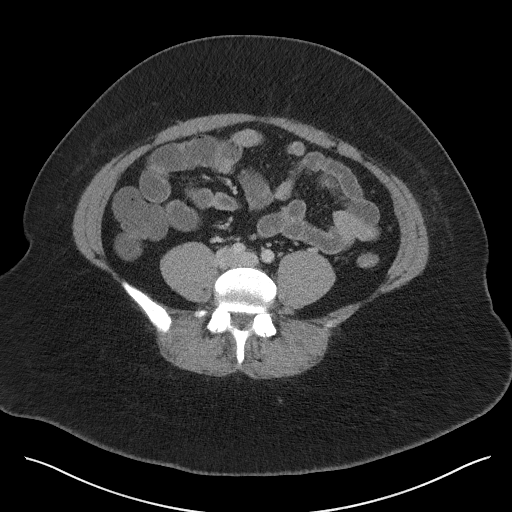
[im 52/96  soft-tissue]
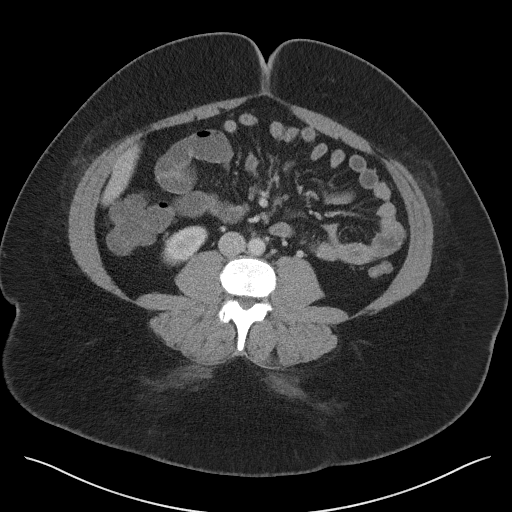
[im 56/96  soft-tissue]
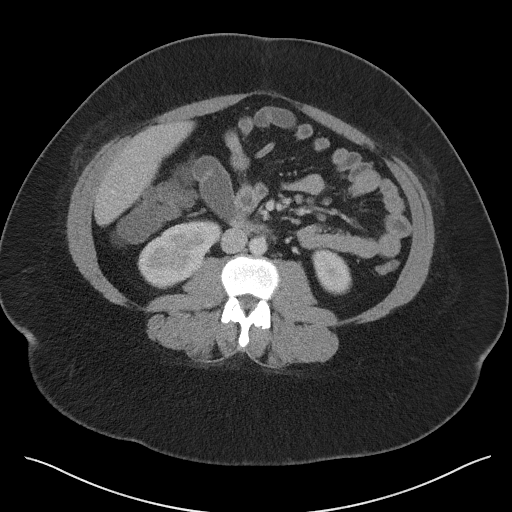
[im 56/96  bone]
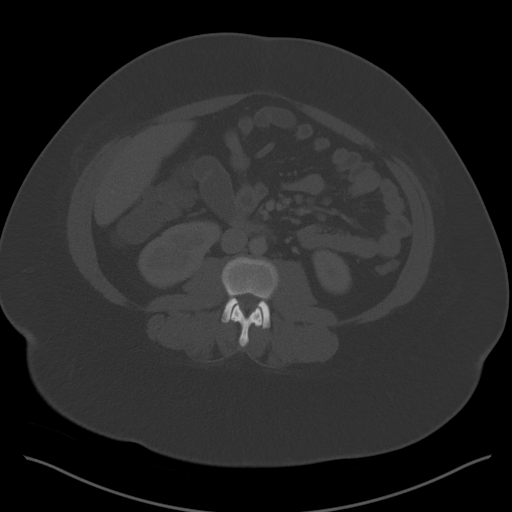
[im 64/96  soft-tissue]
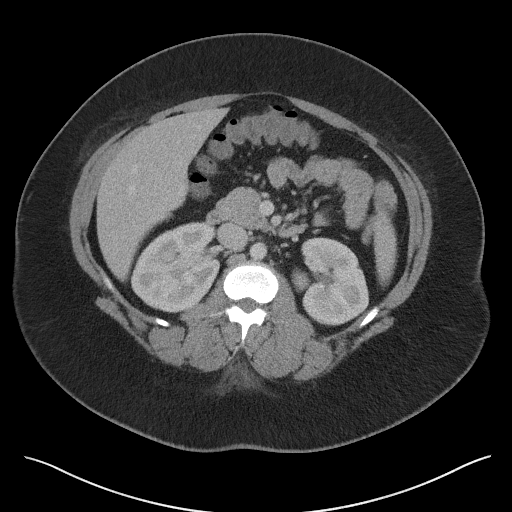
[im 72/96  soft-tissue]
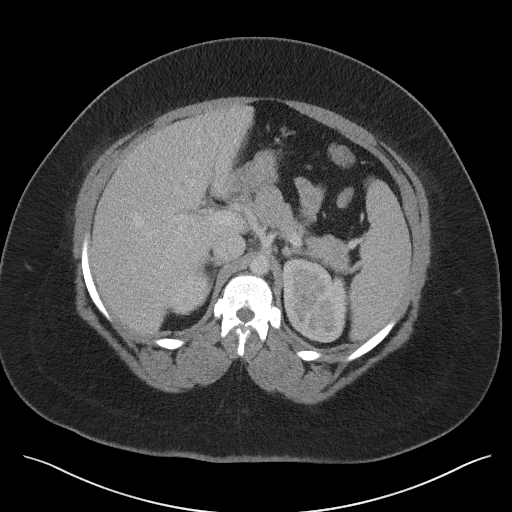
[im 76/96  soft-tissue]
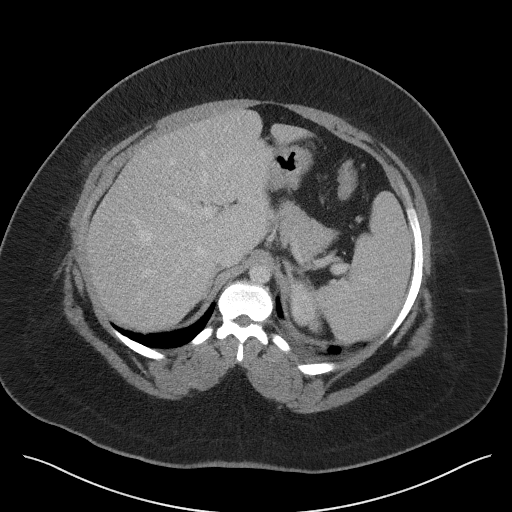
[im 84/96  soft-tissue]
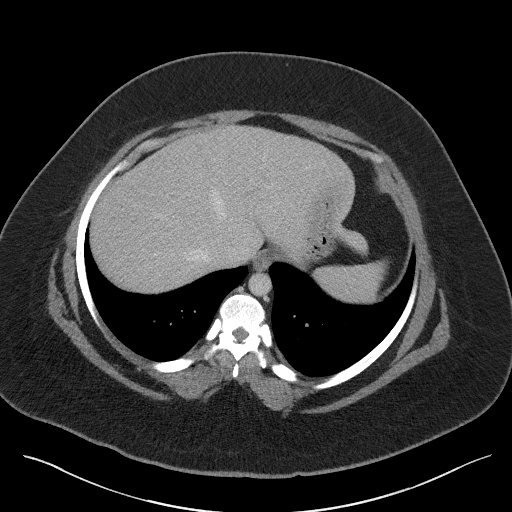
[im 92/96  soft-tissue]
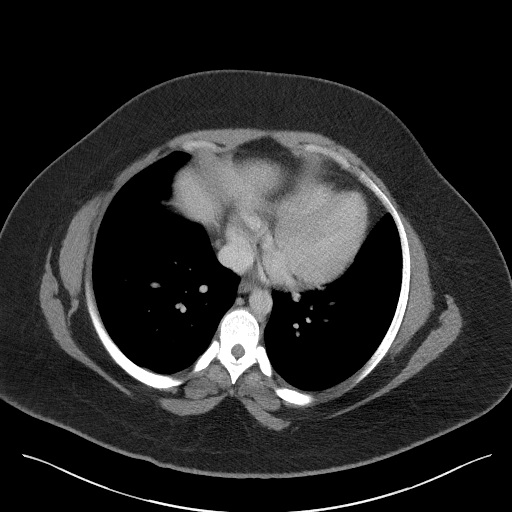

[Series 5: coronal st · coronal · 0.88mm/px · 3 of 114 slices shown]
[im 38/114  soft-tissue]
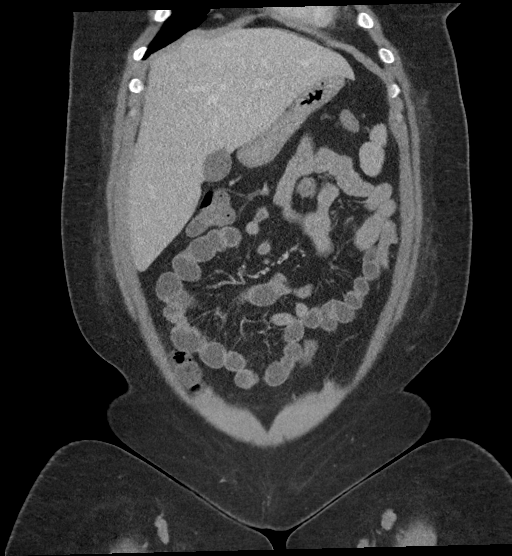
[im 51/114  soft-tissue]
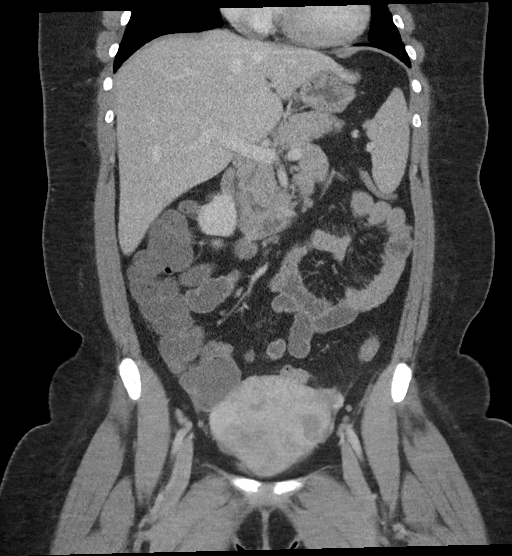
[im 63/114  soft-tissue]
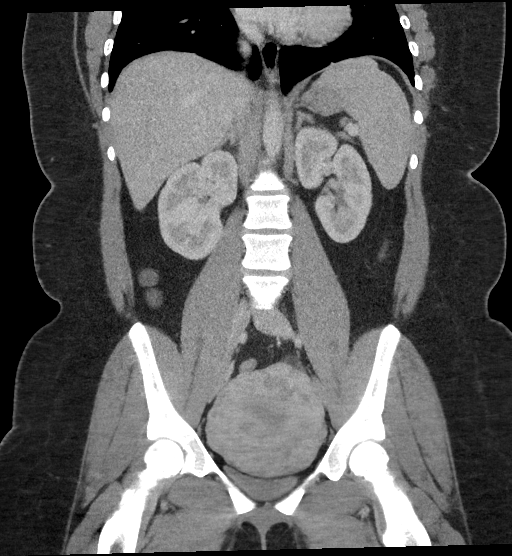

[17 of 46 positions shown; findings below may reference images not displayed]

FINDINGS: Lower chest: No acute abnormality.

Hepatobiliary: No focal liver abnormality is seen. No gallstones,
gallbladder wall thickening, or biliary dilatation.

Pancreas: Unremarkable. No pancreatic ductal dilatation or
surrounding inflammatory changes.

Spleen: Normal in size without focal abnormality.

Adrenals/Urinary Tract: Adrenal glands are unremarkable. Kidneys are
normal, without renal calculi, focal lesion, or hydronephrosis.
Bladder is unremarkable.

Stomach/Bowel: Stomach is within normal limits. Appendix appears
normal. No evidence of bowel wall thickening, distention, or
inflammatory changes.

Vascular/Lymphatic: No significant vascular findings are present. No
enlarged abdominal or pelvic lymph nodes.

Reproductive: Enlarged fibroid uterus is again noted. No adnexal
abnormality is noted.

Other: Small amount of free fluid is noted posteriorly in the pelvis
which most likely is physiologic. No hernia is noted.

Musculoskeletal: No acute or significant osseous findings.
IMPRESSION: Enlarged fibroid uterus. No other abnormality seen in the abdomen or
pelvis.

## 2020-12-27 ENCOUNTER — Ambulatory Visit (INDEPENDENT_AMBULATORY_CARE_PROVIDER_SITE_OTHER): Payer: BC Managed Care – PPO | Admitting: Licensed Clinical Social Worker

## 2020-12-27 ENCOUNTER — Other Ambulatory Visit: Payer: Self-pay

## 2020-12-27 DIAGNOSIS — F330 Major depressive disorder, recurrent, mild: Secondary | ICD-10-CM | POA: Diagnosis not present

## 2020-12-27 DIAGNOSIS — F411 Generalized anxiety disorder: Secondary | ICD-10-CM | POA: Diagnosis not present

## 2020-12-27 NOTE — Plan of Care (Signed)
  Problem: Decrease depressive symptoms and improve levels of effective functioning Goal: LTG: Reduce frequency, intensity, and duration of depression symptoms as evidenced by: SSB input needed on appropriate metric--pt self report Outcome: Progressing Note: Pt reporting fewer depression symptoms than at last session Goal: STG: @PREFFIRSTNAME @ will participate in at least 80% of scheduled individual psychotherapy sessions Outcome: Progressing Note: Pt on time and engaged throughout session Intervention: Encourage patient to identify triggers Note: Identified and reviewed coping skills Intervention: Assess emotional status and coping mechanisms Note: Assessed/reviewed Intervention: REVIEW PLEASE SKILLS (TREAT PHYSICAL ILLNESS, BALANCE EATING, AVOID MOOD-ALTERING SUBSTANCES, BALANCE SLEEP AND GET EXERCISE) WITH [place] Note: Reviewd

## 2020-12-27 NOTE — Progress Notes (Signed)
Virtual Visit via Video Note  I connected with Shari [name] on 12/27/20 at  9:00 AM EST by an a video enabled telemedicine application and verified that I am speaking with the correct person using two identifiers.  Location: Patient: home Provider: [facility]   I discussed the limitations of evaluation and management by telemedicine and the availability of in person appointments. The patient expressed understanding and agreed to proceed.   I discussed the assessment and treatment plan with the patient. The patient was provided an opportunity to ask questions and all were answered. The patient agreed with the plan and demonstrated an understanding of the instructions.   The patient was advised to call back or seek an in-person evaluation if the symptoms worsen or if the condition fails to improve as anticipated.  I provided 30 minutes of non-face-to-face time during this encounter.   [place], LCSW   THERAPIST PROGRESS NOTE  Session Time: [phone]  Participation Level: Active  Behavioral Response: NAAlertAnxious  Type of Therapy: Individual Therapy  Treatment Goals addressed:  Problem: Decrease depressive symptoms and improve levels of effective functioning Goal: LTG: Reduce frequency, intensity, and duration of depression symptoms as evidenced by: SSB input needed on appropriate metric--pt self report Outcome: Progressing Note: Pt reporting fewer depression symptoms than at last session  Goal: STG: @PREFFIRSTNAME @ will participate in at least 80% of scheduled individual psychotherapy sessions Outcome: Progressing Note: Pt on time and engaged throughout session  Interventions:  Intervention: Encourage patient to identify triggers Note: Identified and reviewed coping skills  Intervention: Assess emotional status and coping mechanisms Note: Assessed/reviewed  Intervention: REVIEW PLEASE SKILLS (TREAT PHYSICAL ILLNESS, BALANCE EATING, AVOID MOOD-ALTERING SUBSTANCES,  BALANCE SLEEP AND GET EXERCISE) WITH Lorayne Bender Note: Reviewed   Summary: Shari [name] is a 25 y.o. female who presents with improving symptoms consistent with depression and mild anxiety. Pt reports that overall mood has been stable and pt feels she is managing situational stressors and anxiety well. Reviewed coping skills that have been helpful since last session.   Allowed pt to explore and express thoughts and feelings associated with recent life situations and external stressors.   Continued recommendations are as follows: self care behaviors, positive social engagements, focusing on overall work/home/life balance, and focusing on positive physical and emotional wellness.   Suicidal/Homicidal: No  Therapist Response: Pt is continuing to apply interventions learned in session into daily life situations. Pt is currently on track to meet goals utilizing interventions mentioned above. Personal growth and progress noted. Treatment to continue as indicated.   Plan: Return again in 4 weeks.  Diagnosis: Axis I: MDD, recurrent    Axis II: No diagnosis    [place], LCSW 12/27/2020

## 2021-01-03 ENCOUNTER — Other Ambulatory Visit: Payer: Self-pay

## 2021-01-03 ENCOUNTER — Encounter: Payer: Self-pay | Admitting: Psychiatry

## 2021-01-03 ENCOUNTER — Telehealth (INDEPENDENT_AMBULATORY_CARE_PROVIDER_SITE_OTHER): Payer: BC Managed Care – PPO | Admitting: Psychiatry

## 2021-01-03 DIAGNOSIS — F5105 Insomnia due to other mental disorder: Secondary | ICD-10-CM | POA: Diagnosis not present

## 2021-01-03 DIAGNOSIS — F3342 Major depressive disorder, recurrent, in full remission: Secondary | ICD-10-CM | POA: Diagnosis not present

## 2021-01-03 DIAGNOSIS — F411 Generalized anxiety disorder: Secondary | ICD-10-CM

## 2021-01-03 DIAGNOSIS — F609 Personality disorder, unspecified: Secondary | ICD-10-CM

## 2021-01-03 NOTE — Progress Notes (Signed)
Virtual Visit via Video Note  I connected with Shari [name] on 01/03/21 at  2:00 PM EST by a video enabled telemedicine application and verified that I am speaking with the correct person using two identifiers.  Location Provider Location : [facility] Patient Location : Home  Participants: Patient , Provider   I discussed the limitations of evaluation and management by telemedicine and the availability of in person appointments. The patient expressed understanding and agreed to proceed.    I discussed the assessment and treatment plan with the patient. The patient was provided an opportunity to ask questions and all were answered. The patient agreed with the plan and demonstrated an understanding of the instructions.   The patient was advised to call back or seek an in-person evaluation if the symptoms worsen or if the condition fails to improve as anticipated.                                                         [facility] MD OP Progress Note  01/04/2021 8:59 AM Shari [name]  MRN:  [id number]  Chief Complaint:  Chief Complaint   Follow-up; Anxiety; Depression    HPI: Shari [name] is a 25 year old African-American female, employed, lives in [place], has a history of MDD, GAD, insomnia, type 2 diabetes mellitus was evaluated by telemedicine today.  Patient today reports she is currently doing well with regards to her mood.  Denies any significant anxiety or sadness.  She reports work is going well and she is able to function at work.  She does not take the trazodone much however when she takes it it helps her.  Most nights she is able to sleep without anything.  Patient denies any suicidality, suicidality or perceptual disturbances.  Patient denies any other concerns today.  Visit Diagnosis:    ICD-10-CM   1. MDD (major depressive disorder), recurrent, in full remission ([place])  F33.42     2. GAD (generalized anxiety disorder)  F41.1     3. Insomnia due to mental condition   F51.05    depression, anxiety    4. Personality disorder ([place])  F60.9    unspecified      Past Psychiatric History: Reviewed past psychiatric history from progress note on 02/12/2018.  Past trials of Wellbutrin, BuSpar  Past Medical History:  Past Medical History:  Diagnosis Date   Anxiety    Asthma    well controlled   Diabetes mellitus without complication ([place])    pt states she went off her meds because her numbers were fine   Endometriosis    Hidradenitis suppurativa 12/31 /2019   Pneumonia 01/2017   Refusal of blood product     Past Surgical History:  Procedure Laterality Date   ADENOIDECTOMY Bilateral 02/09/2018   Procedure: REVISION ADENOIDECTOMY;  Surgeon: Carloyn Manner, MD;  Location: [facility] ORS;  Service: ENT;  Laterality: Bilateral;   EYE SURGERY     MYRINGOTOMY WITH TUBE PLACEMENT Right 02/09/2018   Procedure: MYRINGOTOMY WITH TUBE PLACEMENT;  Surgeon: Carloyn Manner, MD;  Location: [facility] OR[facility];  Service: ENT;  Laterality: Right;   NASOPHARYNGOSCOPY EUSTATION TUBE BALLOON DILATION Bilateral 02/09/2018   Procedure: NASOPHARYNGOSCOPY [place];  Surgeon: Carloyn Manner, MD;  Location: [facility] OR[facility];  Service: ENT;  Laterality: Bilateral;   TONSILLECTOMY     TURBINATE REDUCTION  Right 02/09/2018   Procedure: OUTFRACTURE RIGHT INFERIOR TURBINATE;  Surgeon: Carloyn Manner, MD;  Location: [facility] OR[facility];  Service: ENT;  Laterality: Right;    Family Psychiatric History: Reviewed family psychiatric history from progress note on 02/12/2018  Family History:  Family History  Problem Relation Age of Onset   Anxiety disorder Mother    Depression Mother    Alcohol abuse Father    Drug abuse Father    Schizophrenia Maternal Uncle     Social History: Reviewed social history from progress note on 02/12/2018 Social History   Socioeconomic History   Marital status: Single    Spouse name: Not on file   Number of children: 0   Years of education: Not on file    Highest education level: Associate degree: occupational, Hotel manager, or vocational program  Occupational History   Not on file  Tobacco Use   Smoking status: Never   Smokeless tobacco: Never  Vaping Use   Vaping Use: Never used  Substance and Sexual Activity   Alcohol use: Not Currently    Comment: rare   Drug use: Yes    Types: Marijuana   Sexual activity: Yes    Partners: Male    Birth control/protection: Implant, Condom  Other Topics Concern   Not on file  Social History Narrative   Not on file   Social Determinants of Health   Financial Resource Strain: Not on file  Food Insecurity: Not on file  Transportation Needs: Not on file  Physical Activity: Not on file  Stress: Not on file  Social Connections: Not on file    Allergies:  Allergies  Allergen Reactions   Sulfa Antibiotics Hives   Tramadol Other (See Comments)    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 depression  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 depression 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 depression    Metabolic Disorder Labs: Lab Results  Component Value Date   HGBA1C 6.0 (H) 12/07/2015   MPG 126 12/07/2015   Lab Results  Component Value Date   PROLACTIN 14.1 12/07/2015   Lab Results  Component Value Date   CHOL 321 (H) 12/07/2015   TRIG 71 12/07/2015   HDL 41 12/07/2015   CHOLHDL 7.8 12/07/2015   VLDL 14 12/07/2015   LDLCALC 266 (H) 12/07/2015   Lab Results  Component Value Date   TSH 1.265 12/07/2015    Therapeutic Level Labs: No results found for: LITHIUM No results found for: VALPROATE No components found for:  CBMZ  Current Medications: Current Outpatient Medications  Medication Sig Dispense Refill   azelastine (ASTELIN) 0.1 % nasal spray Place into the nose.     cefdinir (OMNICEF) 300 MG capsule Take by mouth.     montelukast (SINGULAIR) 10 MG tablet Take  by mouth.     BINAXNOW COVID-19 AG HOME TEST KIT Use as Directed on the Package     bismuth subsalicylate (PEPTO BISMOL) 262 MG/15ML suspension Take 30 mLs by mouth every 6 (six) hours as needed for indigestion or diarrhea or loose stools.     Blood Glucose Monitoring Suppl (FIFTY50 GLUCOSE METER 2.0) w/Device KIT Use as instructed. Ok to dispense whichever glucometer is covered by insurance E11.65     Dulaglutide 0.75 MG/0.5ML SOPN Inject into the skin.     etonogestrel (NEXPLANON) 68 MG IMPL implant 68 mg by Subdermal route once.  exenatide (BYETTA 10 MCG PEN) 10 MCG/0.04ML SOPN injection Inject 10 mcg into the skin 2 (two) times daily with a meal.     ezetimibe (ZETIA) 10 MG tablet Take 10 mg by mouth daily.     FEROSUL 325 (65 Fe) MG tablet Take 325 mg by mouth daily.     fluconazole (DIFLUCAN) 150 MG tablet Take 1 tablet (150 mg total) by mouth daily. 1 tablet 1   fluticasone (FLONASE) 50 MCG/ACT nasal spray Place into the nose.     fluticasone (FLONASE) 50 MCG/ACT nasal spray Place into both nostrils.     Fluticasone-Salmeterol (ADVAIR) 250-50 MCG/DOSE AEPB fluticasone 250 mcg-salmeterol 50 mcg/dose blistr powdr for inhalation     gabapentin (NEURONTIN) 300 MG capsule Take by mouth.     glucose blood (PRECISION QID TEST) test strip Dispense 100 blood glucose test strips, ok to sub any brand preferred by insurance/patient to match with meter, use 3x/day, dx E11.65     hydrocortisone (ANUSOL-HC) 25 MG suppository Place rectally.     Insulin Degludec (TRESIBA FLEXTOUCH) 200 UNIT/ML SOPN Inject into the skin.     JARDIANCE 25 MG TABS tablet      ketoconazole (NIZORAL) 2 % cream Apply 1 application topically 2 (two) times daily.     letrozole (FEMARA) 2.5 MG tablet      metFORMIN (GLUCOPHAGE-XR) 500 MG 24 hr tablet TAKE 1 TABLET BY MOUTH TWICE DAILY FOR DIABETES. (Patient not taking: Reported on 10/05/2020)     metFORMIN (GLUCOPHAGE-XR) 500 MG 24 hr tablet Take by mouth. (Patient not taking:  Reported on 10/05/2020)     methylPREDNISolone (MEDROL DOSEPAK) 4 MG TBPK tablet Take Tapered dose as directed 21 tablet 0   metroNIDAZOLE (FLAGYL) 500 MG tablet Take 500 mg by mouth 2 (two) times daily.     montelukast (SINGULAIR) 10 MG tablet TAKE 1 TABLET BY MOUTH ONCE DAILY FOR ASTHMA OR ALLERGIES.     nirmatrelvir & ritonavir (PAXLOVID, 300/100,) 20 x 150 MG & 10 x 100MG TBPK See package instructions.     norethindrone (AYGESTIN) 5 MG tablet Take 2.5 mg by mouth 2 (two) times daily.      nystatin cream (MYCOSTATIN) Apply 1 application topically 2 (two) times daily. Apply to external vaginal area twice a day for yeast infection. 30 g 1   omeprazole (PRILOSEC) 20 MG capsule TAKE 1 CAPSULE BY MOUTH ONCE DAILY FOR REFLUX     omeprazole (PRILOSEC) 20 MG capsule Take 1 capsule by mouth daily.     ondansetron (ZOFRAN) 4 MG tablet Take 4 mg by mouth every 8 (eight) hours as needed.     ondansetron (ZOFRAN-ODT) 4 MG disintegrating tablet Take 4 mg by mouth every 8 (eight) hours as needed.     OZEMPIC, 0.25 OR 0.5 MG/DOSE, 2 MG/1.5ML SOPN Inject 1 mg into the skin.     PAXLOVID, 300/100, 20 x 150 MG & 10 x 100MG TBPK Take by mouth.     PROAIR HFA 108 (90 Base) MCG/ACT inhaler Inhale 2 puffs into the lungs every 6 (six) hours as needed for wheezing or shortness of breath.      rosuvastatin (CRESTOR) 40 MG tablet TAKE 1 TABLET BY MOUTH ONCE DAILY FOR HIGH CHOLESTEROL     sertraline (ZOLOFT) 100 MG tablet Take 2 tablets (200 mg total) by mouth daily. 60 tablet 2   TRADJENTA 5 MG TABS tablet      traZODone (DESYREL) 100 MG tablet Take 1-1.5 tablets (100-150 mg total) by mouth  at bedtime as needed for sleep. 45 tablet 2   TRUEPLUS PEN NEEDLES 32G X 4 MM MISC      valACYclovir (VALTREX) 1000 MG tablet Take by mouth.     No current facility-administered medications for this visit.     Musculoskeletal: Strength & Muscle Tone: within normal limits Gait & Station: normal Patient leans: N/A  Psychiatric  Specialty Exam: Review of Systems  Psychiatric/Behavioral:  Negative for agitation, behavioral problems, confusion, decreased concentration, dysphoric mood, hallucinations, self-injury, sleep disturbance and suicidal ideas. The patient is not nervous/anxious and is not hyperactive.   All other systems reviewed and are negative.  There were no vitals taken for this visit.There is no height or weight on file to calculate BMI.  General Appearance: Casual  Eye Contact:  Good  Speech:  Clear and Coherent  Volume:  Normal  Mood:  Euthymic  Affect:  Congruent  Thought Process:  Goal Directed and Descriptions of Associations: Intact  Orientation:  Full (Time, Place, and Person)  Thought Content: Logical   Suicidal Thoughts:  No  Homicidal Thoughts:  No  Memory:  Immediate;   Fair Recent;   Fair Remote;   Fair  Judgement:  Fair  Insight:  Fair  Psychomotor Activity:  Normal  Concentration:  Concentration: Fair and Attention Span: Fair  Recall:  [employer] of Knowledge: Fair  Language: Fair  Akathisia:  No  Handed:  Right  AIMS (if indicated): done, 0  Assets:  Communication Skills Desire for Improvement Housing Social Support  ADL's:  Intact  Cognition: WNL  Sleep:  Fair   Screenings: AUDIT    Flowsheet Row Admission (Discharged) from 12/06/2015 in [facility]  Alcohol Use Disorder Identification Test Final Score (AUDIT) 1      GAD-7    Flowsheet Row Video Visit from 01/03/2021 in [facility] Video Visit from 11/09/2020 in [facility] Video Visit from 10/05/2020 in [facility] from 09/21/2020 in [facility] Video Visit from 09/07/2020 in [facility]  Total GAD-7 Score 2 2 3 13 1       PHQ2-9    Flowsheet Row Video Visit from 01/03/2021 in [facility] Video Visit from  11/09/2020 in [facility] Video Visit from 10/05/2020 in [facility] from 09/21/2020 in [facility] Video Visit from 09/07/2020 in [facility]  PHQ-2 Total Score 0 0 0 3 1  PHQ-9 Total Score -- 0 -- 10 2      Flowsheet Row Video Visit from 01/03/2021 in [facility] Counselor from 12/27/2020 in [facility] Counsel[facility] from 11/22/2020 in [place] [facility] No Risk No Risk No Risk        Assessment and Plan: Lanise Mergen is a 25 year old African-American female who is employed, lives in [place], has a history of depression, GAD was evaluated by telemedicine today.  Patient is currently stable.  Plan MDD in remission Zoloft 200 mg p.o. daily AIMS - 0  GAD-stable Zoloft 200 mg p.o. daily Hydroxyzine 12.5-25 mg p.o. daily as needed for severe anxiety attacks Continue CBT with Ms. Christina Hussami  Insomnia-improving Trazodone 100-200 mg p.o. nightly as needed  Personality disorder unspecified-rule out borderline personality disorder Continue CBT  Follow-up in clinic in 2 to 3 months or sooner in person.  This note was generated in part or whole with voice  recognition software. Voice recognition is usually quite accurate but there are transcription errors that can and very often do occur. I apologize for any typographical errors that were not detected and corrected.      Shari Alert, MD 01/04/2021, 8:59 AM

## 2021-01-23 NOTE — Progress Notes (Signed)
error 

## 2021-02-09 ENCOUNTER — Ambulatory Visit (INDEPENDENT_AMBULATORY_CARE_PROVIDER_SITE_OTHER): Payer: BC Managed Care – PPO | Admitting: Licensed Clinical Social Worker

## 2021-02-09 ENCOUNTER — Other Ambulatory Visit: Payer: Self-pay

## 2021-02-09 DIAGNOSIS — F3342 Major depressive disorder, recurrent, in full remission: Secondary | ICD-10-CM

## 2021-02-09 DIAGNOSIS — F411 Generalized anxiety disorder: Secondary | ICD-10-CM | POA: Diagnosis not present

## 2021-02-09 NOTE — Progress Notes (Signed)
Virtual Visit via Audio Note  I connected with Aggie Douse on 02/09/21 at  9:00 AM EST by an a audio enabled telemedicine application and verified that I am speaking with the correct person using two identifiers.  Video connection was lost when less than 50% of the duration of the visit was complete, at which time the remainder of the visit was completed via audio only.  Location: Patient: home Provider: [facility]   I discussed the limitations of evaluation and management by telemedicine and the availability of in person appointments. The patient expressed understanding and agreed to proceed.   I discussed the assessment and treatment plan with the patient. The patient was provided an opportunity to ask questions and all were answered. The patient agreed with the plan and demonstrated an understanding of the instructions.   The patient was advised to call back or seek an in-person evaluation if the symptoms worsen or if the condition fails to improve as anticipated.  I provided 15 minutes of non-face-to-face time during this encounter.   [place], LCSW   THERAPIST PROGRESS NOTE  Session Time: [phone]  Participation Level: Active  Behavioral Response: NAAlertAnxious  Type of Therapy: Individual Therapy  Treatment Goals addressed:  Problem: Decrease depressive symptoms and improve levels of effective functioning Goal: LTG: Reduce frequency, intensity, and duration of depression symptoms as evidenced by: SSB input needed on appropriate metric--pt self report Outcome: Progressing Note: Pt reporting fewer depression symptoms than at last session  Goal: STG: @PREFFIRSTNAME @ will participate in at least 80% of scheduled individual psychotherapy sessions Outcome: Progressing  Interventions:  Intervention: Encourage patient to identify triggers  Intervention: Assess emotional status and coping mechanisms  Intervention: REVIEW PLEASE SKILLS (TREAT PHYSICAL ILLNESS, BALANCE  EATING, AVOID MOOD-ALTERING SUBSTANCES, BALANCE SLEEP AND GET EXERCISE) WITH Lorayne Bender   Summary: Sarissa Dern is a 26 y.o. female who presents with improving symptoms consistent with depression and mild anxiety. Pt reports that overall mood has been stable and pt feels she is managing situational stressors and anxiety well. Reviewed coping skills that have been helpful since last session.   Allowed pt to explore and express thoughts and feelings associated with recent life situations and external stressors. Pt reports that she recently got a new puppy husky/pit mix that has had destructive moments which has triggered anxiety/stress. Pt reports that dog damaged an entire door that needed to be replaced. Pt reports dog escapes from cage regularly so pt has had to replace the door, which has triggered anxiety/stress. Pt reporting that she has purchased an industrial-strength crate for her dog and cannot wait to get it. Overall pt is happy with the dog and is finding the positives in the situation.  Pt requested to end session early.   Continued recommendations are as follows: self care behaviors, positive social engagements, focusing on overall work/home/life balance, and focusing on positive physical and emotional wellness.   Suicidal/Homicidal: No  Therapist Response: Pt is continuing to apply interventions learned in session into daily life situations. Pt is currently on track to meet goals utilizing interventions mentioned above. Personal growth and progress noted. Treatment to continue as indicated.   Plan: Return again in 4 weeks.  Diagnosis: Axis I: MDD, recurrent    Axis II: No diagnosis    Rachel Bo Xerxes [place]gr[place], LCSW 02/09/2021

## 2021-03-19 ENCOUNTER — Other Ambulatory Visit: Payer: Self-pay

## 2021-03-19 ENCOUNTER — Ambulatory Visit (INDEPENDENT_AMBULATORY_CARE_PROVIDER_SITE_OTHER): Payer: BC Managed Care – PPO | Admitting: Licensed Clinical Social Worker

## 2021-03-19 DIAGNOSIS — F3342 Major depressive disorder, recurrent, in full remission: Secondary | ICD-10-CM

## 2021-03-19 DIAGNOSIS — F411 Generalized anxiety disorder: Secondary | ICD-10-CM

## 2021-03-19 NOTE — Plan of Care (Signed)
°  Problem: Decrease depressive symptoms and improve levels of effective functioning Goal: LTG: Reduce frequency, intensity, and duration of depression symptoms as evidenced by: SSB input needed on appropriate metric--pt self report Outcome: Progressing Goal: STG: @PREFFIRSTNAME @ will participate in at least 80% of scheduled individual psychotherapy sessions Outcome: Progressing Intervention: Encourage verbalization of feelings/concerns/expectations Intervention: Encourage self-care activities Intervention: Educate patient on: Substance abuse Intervention: Encourage family support

## 2021-03-19 NOTE — Progress Notes (Signed)
Virtual Visit via Audio Note  I connected with Shari [name] on 03/19/21 at  2:00 PM EST by an a audio enabled telemedicine application and verified that I am speaking with the correct person using two identifiers.  Video connection was lost when less than 50% of the duration of the visit was complete, at which time the remainder of the visit was completed via audio only.  Location: Patient: home Provider: [facility]   I discussed the limitations of evaluation and management by telemedicine and the availability of in person appointments. The patient expressed understanding and agreed to proceed.   I discussed the assessment and treatment plan with the patient. The patient was provided an opportunity to ask questions and all were answered. The patient agreed with the plan and demonstrated an understanding of the instructions.   The patient was advised to call back or seek an in-person evaluation if the symptoms worsen or if the condition fails to improve as anticipated.  I provided 15 minutes of non-face-to-face time during this encounter.   [place], LCSW   THERAPIST PROGRESS NOTE  Session Time: [phone]  Participation Level: Active  Behavioral Response: NAAlertAnxious  Type of Therapy: Individual Therapy  Treatment Goals addressed:  Problem: Decrease depressive symptoms and improve levels of effective functioning Goal: LTG: Reduce frequency, intensity, and duration of depression symptoms as evidenced by: SSB input needed on appropriate metric--pt self report Outcome: Progressing Note: Pt reporting fewer depression symptoms than at last session  Goal: STG: @PREFFIRSTNAME @ will participate in at least 80% of scheduled individual psychotherapy sessions Outcome: Progressing  Interventions:  Intervention: Encourage patient to identify triggers  Intervention: Assess emotional status and coping mechanisms  Intervention: REVIEW PLEASE SKILLS (TREAT PHYSICAL ILLNESS, BALANCE  EATING, AVOID MOOD-ALTERING SUBSTANCES, BALANCE SLEEP AND GET EXERCISE) WITH Lorayne Bender   Summary: Shari [name] is a 26 y.o. female who presents with improving symptoms consistent with depression and mild anxiety. Pt reports that overall mood has been stable and pt feels she is managing situational stressors and anxiety well. Reviewed coping skills that have been helpful since last session.   Allowed pt to explore and express thoughts and feelings associated with recent life situations and external stressors. Explored pts current experience dealing with her mother's addiction. Pt feels upset that she did not see the signs that her mother was struggling with addiction. Pt reports that her sister was aware and brought signs/symptoms to pts attention. Pt reports that her mother has been borrowing cars, asking for money, etc. Pt states that her and sister want to confront their mother because she is supposed to be the caregiver to their grandmother and they are worried about the grandmother's well being because the mother is out and about. Pt also states that her mother doesn't help her mother take care of the house and pt feels its more of a hoarding situation and she is worried.   Reviewed the importance of self care to help pt stay balanced while coping with stressors. Pt states that communication will happen with her mother soon.   Pt requested to end session early.   Continued recommendations are as follows: self care behaviors, positive social engagements, focusing on overall work/home/life balance, and focusing on positive physical and emotional wellness.   Suicidal/Homicidal: No  Therapist Response: Pt is continuing to apply interventions learned in session into daily life situations. Pt is currently on track to meet goals utilizing interventions mentioned above. Personal growth and progress noted. Treatment to continue as indicated.  Plan: Return again in 4 weeks.  Diagnosis: Axis I: MDD,  recurrent  Collaboration of Care: Other Pt encouraged to continue psychiatric follow ups with psychiatrist--Dr. Shea Evans  Patient/Guardian was advised Release of Information must be obtained prior to any record release in order to collaborate their care with an outside provider. Patient/Guardian was advised if they have not already done so to contact the registration department to sign all necessary forms in order for [place] to release information regarding their care.   Consent: Patient/Guardian gives verbal consent for treatment and assignment of benefits for services provided during this visit. Patient/Guardian expressed understanding and agreed to proceed.        [place], LCSW 03/19/2021

## 2021-04-03 ENCOUNTER — Other Ambulatory Visit: Payer: Self-pay

## 2021-04-03 ENCOUNTER — Ambulatory Visit (INDEPENDENT_AMBULATORY_CARE_PROVIDER_SITE_OTHER): Payer: BC Managed Care – PPO | Admitting: Psychiatry

## 2021-04-03 ENCOUNTER — Telehealth (HOSPITAL_COMMUNITY): Payer: Self-pay | Admitting: Licensed Clinical Social Worker

## 2021-04-03 ENCOUNTER — Encounter: Payer: Self-pay | Admitting: Psychiatry

## 2021-04-03 VITALS — BP 141/83 | HR 87 | Temp 97.9°F | Wt 278.0 lb

## 2021-04-03 DIAGNOSIS — F5105 Insomnia due to other mental disorder: Secondary | ICD-10-CM | POA: Diagnosis not present

## 2021-04-03 DIAGNOSIS — F331 Major depressive disorder, recurrent, moderate: Secondary | ICD-10-CM

## 2021-04-03 DIAGNOSIS — F411 Generalized anxiety disorder: Secondary | ICD-10-CM | POA: Diagnosis not present

## 2021-04-03 DIAGNOSIS — Z9189 Other specified personal risk factors, not elsewhere classified: Secondary | ICD-10-CM

## 2021-04-03 DIAGNOSIS — F603 Borderline personality disorder: Secondary | ICD-10-CM

## 2021-04-03 MED ORDER — ZIPRASIDONE HCL 20 MG PO CAPS: 20.0000 mg | ORAL_CAPSULE | Freq: Every day | ORAL | 0 refills | Status: DC

## 2021-04-03 NOTE — Progress Notes (Signed)
[facility] MD OP Progress Note  04/03/2021 1:05 PM Shari [name]  MRN:  [id number]  Chief Complaint:  Chief Complaint  Patient presents with   Follow-up: 26 year old African-American female, with history of MDD, GAD, insomnia, personality disorder, presented for medication management.   HPI: Shari [name] is a 26 year old African-American female, employed, lives in [place], has a history of MDD, GAD, insomnia, 2 diabetes mellitus was evaluated in office today.  Patient reports she is currently going through psychosocial stressors of job related problems.  Patient reports she currently works in a toxic environment and does not feel happy working there.  Patient reports she is not the only one who feels that way and that other employees were also affected.  Patient reports she feels not included in conversations, celebrations.  Patient reports she feels left out since they did not acknowledge her 1 year anniversary at her workplace.  Patient notes that she often feels that they are talking behind her back all the time.  She has wants to look for another job.  She wants to stay at this job and find another job if possible.  Has started filling out application forms.  Patient reports because of her job related stressors she feels sad, down, anxious all the time, has concentration problems, as well as has been thinking a lot about what her purpose in this world is.  Denies any active suicidal thoughts or plan.  Currently compliant on her medications.  Patient agreeable to referral for partial hospitalization program.  Also reports she is motivated to stay in therapy.  Denies any homicidality.  Does have a history of diabetes melitis type II, had recent changes to her medications however has been noncompliant with some of her medications including insulin due to cost.  She is currently working with her providers on the same.    Visit Diagnosis:    ICD-10-CM   1. MDD (major depressive disorder),  recurrent episode, moderate (HCC)  F33.1 ziprasidone (GEODON) 20 MG capsule    EKG 12-Lead    2. GAD (generalized anxiety disorder)  F41.1 ziprasidone (GEODON) 20 MG capsule    3. Insomnia due to mental condition  F51.05 ziprasidone (GEODON) 20 MG capsule   Depression, anxiety    4. Borderline personality disorder ([place])  F60.3     5. At risk for prolonged QT interval syndrome  Z91.89 EKG 12-Lead      Past Psychiatric History: Reviewed past psychiatric history from progress note on 02/12/2018.  Past trials of Wellbutrin, BuSpar.  Past Medical History:  Past Medical History:  Diagnosis Date   Anxiety    Asthma    well controlled   Diabetes mellitus without complication ([place])    pt states she went off her meds because her numbers were fine   Endometriosis    Hidradenitis suppurativa 12/31 /2019   Pneumonia 01/2017   Refusal of blood product     Past Surgical History:  Procedure Laterality Date   ADENOIDECTOMY Bilateral 02/09/2018   Procedure: REVISION ADENOIDECTOMY;  Surgeon: Carloyn Manner, MD;  Location: [facility] ORS;  Service: ENT;  Laterality: Bilateral;   EYE SURGERY     MYRINGOTOMY WITH TUBE PLACEMENT Right 02/09/2018   Procedure: MYRINGOTOMY WITH TUBE PLACEMENT;  Surgeon: Carloyn Manner, MD;  Location: [facility] OR[facility];  Service: ENT;  Laterality: Right;   NASOPHARYNGOSCOPY EUSTATION TUBE BALLOON DILATION Bilateral 02/09/2018   Procedure: NASOPHARYNGOSCOPY [place];  Surgeon: Carloyn Manner, MD;  Location: [facility] OR[facility];  Service: ENT;  Laterality: Bilateral;  TONSILLECTOMY     TURBINATE REDUCTION Right 02/09/2018   Procedure: OUTFRACTURE RIGHT INFERIOR TURBINATE;  Surgeon: Carloyn Manner, MD;  Location: [facility] OR[facility];  Service: ENT;  Laterality: Right;    Family Psychiatric History: Reviewed family psychiatric history from progress note on 02/12/2018.  Family History:  Family History  Problem Relation Age of Onset   Anxiety disorder Mother    Depression  Mother    Alcohol abuse Father    Drug abuse Father    Schizophrenia Maternal Uncle     Social History: Reviewed social history from progress note on 02/12/2018. Social History   Socioeconomic History   Marital status: Single    Spouse name: Not on file   Number of children: 0   Years of education: Not on file   Highest education level: Associate degree: occupational, Hotel manager, or vocational program  Occupational History   Not on file  Tobacco Use   Smoking status: Never   Smokeless tobacco: Never  Vaping Use   Vaping Use: Never used  Substance and Sexual Activity   Alcohol use: Not Currently    Comment: rare   Drug use: Yes    Types: Marijuana   Sexual activity: Yes    Partners: Male    Birth control/protection: Implant, Condom  Other Topics Concern   Not on file  Social History Narrative   Not on file   Social Determinants of Health   Financial Resource Strain: Not on file  Food Insecurity: Not on file  Transportation Needs: Not on file  Physical Activity: Not on file  Stress: Not on file  Social Connections: Not on file    Allergies:  Allergies  Allergen Reactions   Sulfa Antibiotics Hives   Tramadol Other (See Comments)    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 depression  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 depression 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 depression    Metabolic Disorder Labs: Lab Results  Component Value Date   HGBA1C 6.0 (H) 12/07/2015   MPG 126 12/07/2015   Lab Results  Component Value Date   PROLACTIN 14.1 12/07/2015   Lab Results  Component Value Date   CHOL 321 (H) 12/07/2015   TRIG 71 12/07/2015   HDL 41 12/07/2015   CHOLHDL 7.8 12/07/2015   VLDL 14 12/07/2015   LDLCALC 266 (H) 12/07/2015   Lab Results  Component Value Date   TSH 1.265 12/07/2015    Therapeutic Level Labs: No results found for:  LITHIUM No results found for: VALPROATE No components found for:  CBMZ  Current Medications: Current Outpatient Medications  Medication Sig Dispense Refill   azelastine (ASTELIN) 0.1 % nasal spray Place into the nose.     BINAXNOW COVID-19 AG HOME TEST KIT Use as Directed on the Package     bismuth subsalicylate (PEPTO BISMOL) 262 MG/15ML suspension Take 30 mLs by mouth every 6 (six) hours as needed for indigestion or diarrhea or loose stools.     Blood Glucose Monitoring Suppl (FIFTY50 GLUCOSE METER 2.0) w/Device KIT Use as instructed. Ok to dispense whichever glucometer is covered by insurance E11.65     Dulaglutide 0.75 MG/0.5ML SOPN Inject into the skin.     etonogestrel (NEXPLANON) 68 MG IMPL implant 68 mg by Subdermal route once.      exenatide (BYETTA 10 MCG PEN) 10 MCG/0.04ML SOPN injection Inject 10 mcg into the skin  2 (two) times daily with a meal.     ezetimibe (ZETIA) 10 MG tablet Take 10 mg by mouth daily.     FEROSUL 325 (65 Fe) MG tablet Take 325 mg by mouth daily.     fluconazole (DIFLUCAN) 150 MG tablet Take 1 tablet (150 mg total) by mouth daily. 1 tablet 1   fluticasone (FLONASE) 50 MCG/ACT nasal spray Place into both nostrils.     Fluticasone-Salmeterol (ADVAIR) 250-50 MCG/DOSE AEPB fluticasone 250 mcg-salmeterol 50 mcg/dose blistr powdr for inhalation     gabapentin (NEURONTIN) 300 MG capsule Take by mouth.     glucose blood (PRECISION QID TEST) test strip Dispense 100 blood glucose test strips, ok to sub any brand preferred by insurance/patient to match with meter, use 3x/day, dx E11.65     Insulin Degludec (TRESIBA FLEXTOUCH) 200 UNIT/ML SOPN Inject into the skin.     insulin glargine (LANTUS SOLOSTAR) 100 UNIT/ML Solostar Pen Use up to 60 units daily     insulin lispro (HUMALOG) 100 UNIT/ML KwikPen Inject 10 units before dinner     JARDIANCE 25 MG TABS tablet      ketoconazole (NIZORAL) 2 % cream Apply 1 application topically 2 (two) times daily.     letrozole  (FEMARA) 2.5 MG tablet      metFORMIN (GLUCOPHAGE-XR) 500 MG 24 hr tablet      methylPREDNISolone (MEDROL DOSEPAK) 4 MG TBPK tablet Take Tapered dose as directed 21 tablet 0   metroNIDAZOLE (FLAGYL) 500 MG tablet Take 500 mg by mouth 2 (two) times daily.     montelukast (SINGULAIR) 10 MG tablet TAKE 1 TABLET BY MOUTH ONCE DAILY FOR ASTHMA OR ALLERGIES.     montelukast (SINGULAIR) 10 MG tablet Take by mouth.     nirmatrelvir & ritonavir (PAXLOVID, 300/100,) 20 x 150 MG & 10 x 100MG TBPK See package instructions.     norethindrone (AYGESTIN) 5 MG tablet Take 2.5 mg by mouth 2 (two) times daily.      nystatin cream (MYCOSTATIN) Apply 1 application topically 2 (two) times daily. Apply to external vaginal area twice a day for yeast infection. 30 g 1   omeprazole (PRILOSEC) 20 MG capsule Take 1 capsule by mouth daily.     ondansetron (ZOFRAN) 4 MG tablet Take 4 mg by mouth every 8 (eight) hours as needed.     OZEMPIC, 0.25 OR 0.5 MG/DOSE, 2 MG/1.5ML SOPN Inject 1 mg into the skin.     PAXLOVID, 300/100, 20 x 150 MG & 10 x 100MG TBPK Take by mouth.     PROAIR HFA 108 (90 Base) MCG/ACT inhaler Inhale 2 puffs into the lungs every 6 (six) hours as needed for wheezing or shortness of breath.      rosuvastatin (CRESTOR) 40 MG tablet TAKE 1 TABLET BY MOUTH ONCE DAILY FOR HIGH CHOLESTEROL     sertraline (ZOLOFT) 100 MG tablet Take 2 tablets (200 mg total) by mouth daily. 60 tablet 2   TRADJENTA 5 MG TABS tablet      traZODone (DESYREL) 100 MG tablet Take 1-1.5 tablets (100-150 mg total) by mouth at bedtime as needed for sleep. 45 tablet 2   TRUEPLUS PEN NEEDLES 32G X 4 MM MISC      valACYclovir (VALTREX) 1000 MG tablet Take by mouth.     ziprasidone (GEODON) 20 MG capsule Take 1 capsule (20 mg total) by mouth daily with supper. 30 capsule 0   No current facility-administered medications for this visit.  Musculoskeletal: Strength & Muscle Tone: within normal limits Gait & Station: normal Patient  leans: N/A  Psychiatric Specialty Exam: Review of Systems  Psychiatric/Behavioral:  Positive for decreased concentration, dysphoric mood and sleep disturbance. The patient is nervous/anxious.   All other systems reviewed and are negative.  Blood pressure (!) 141/83, pulse 87, temperature 97.9 F (36.6 C), temperature source Temporal, weight 278 lb (126.1 kg).Body mass index is 42.27 kg/m.  General Appearance: Casual  Eye Contact:  Fair  Speech:  Clear and Coherent  Volume:  Normal  Mood:  Anxious and Depressed  Affect:  Congruent  Thought Process:  Goal Directed and Descriptions of Associations: Intact  Orientation:  Full (Time, Place, and Person)  Thought Content: Logical , as noted above does report work-related stressors, unknown if true paranoia.  Suicidal Thoughts:  No  Homicidal Thoughts:  No  Memory:  Immediate;   Fair Recent;   Fair Remote;   Fair  Judgement:  Fair  Insight:  Fair  Psychomotor Activity:  Normal  Concentration:  Concentration: Fair and Attention Span: Fair  Recall:  [employer] of Knowledge: Fair  Language: Fair  Akathisia:  No  Handed:  Right  AIMS (if indicated): done, 0  Assets:  Communication Skills Desire for Improvement Housing Social Support  ADL's:  Intact  Cognition: WNL  Sleep:   restless   Screenings: AUDIT    Flowsheet Row Admission (Discharged) from 12/06/2015 in [facility]  Alcohol Use Disorder Identification Test Final Score (AUDIT) 1      GAD-7    Flowsheet Row Video Visit from 01/03/2021 in [facility] Video Visit from 11/09/2020 in [facility] Video Visit from 10/05/2020 in [facility] from 09/21/2020 in [facility] Video Visit from 09/07/2020 in [facility]  Total GAD-7 Score _0 PHQ2-9    [facility] Visit from 04/03/2021 in  [facility] from 03/19/2021 in [facility] Video Visit from 01/03/2021 in [facility] Video Visit from 11/09/2020 in [facility] Video Visit from 10/05/2020 in [facility]  PHQ-2 Total Score 4 0 0 0 0  PHQ-9 Total Score 18 -- -- 0 --      [facility] [facility] Office Visit from 04/03/2021 in [facility] Counselor from 03/19/2021 in [facility] Counsel[facility] from 02/09/2021 in [facility] [place] No Risk No Risk        Assessment and Plan: Georjean Toya is a 26 year old African-American female who is employed, lives in [place], has a history of depression, GAD was evaluated in office today.  Patient is currently struggling with job-related stressors, with worsening mood symptoms , passive suicidal thoughts, will benefit from medication changes as well as referral for intensive program like PHP.  Plan as noted below.  Plan MDD-unstable Zoloft 200 mg p.o. daily Start Geodon 20 mg p.o. daily with supper  GAD-unstable Patient advised to have more frequent sessions with a therapist-Ms. Christina Hussami Referred for PHP Zoloft 200 mg p.o. daily Hydroxyzine 12.5-25 mg p.o. daily as needed for severe anxiety attacks Start Geodon 20 mg p.o. daily with supper  Insomnia-unstable Geodon added as noted above, might help with sleep also. Trazodone 100-200 mg p.o. nightly as needed  Borderline personality disorder-unstable Continue CBT.  At risk for prolonged QT syndrome-we will  order EKG.  Patient provided [phone] to contact for appointment.  EKG needs to be monitored since she is being started on Geodon.  Crisis plan discussed with patient.  Provided information for [facility] behavioral health urgent care.  Follow-up in clinic in 10 days  or sooner in person.  Collaboration of Care: Collaboration of Care: Referral or follow-up with counselor/therapist AEB communicated with staff to see if patient could be seen by therapist sooner and Other also sent a referral to [facility], PHP.  Patient/Guardian was advised Release of Information must be obtained prior to any record release in order to collaborate their care with an outside provider. Patient/Guardian was advised if they have not already done so to contact the registration department to sign all necessary forms in order for [place] to release information regarding their care.   Consent: Patient/Guardian gives verbal consent for treatment and assignment of benefits for services provided during this visit. Patient/Guardian expressed understanding and agreed to proceed.   This note was generated in part or whole with voice recognition software. Voice recognition is usually quite accurate but there are transcription errors that can and very often do occur. I apologize for any typographical errors that were not detected and corrected.      Ursula Alert, MD 04/03/2021, 1:05 PM

## 2021-04-03 NOTE — Patient Instructions (Addendum)
[facility] [email or web address] [place], [place] [address]  ~2.6 mi [phone]    Please call for EKG - [phone]   Ziprasidone Capsules What is this medication? ZIPRASIDONE (zi PRAY si done) treats schizophrenia and bipolar disorder. It works by balancing the levels of dopamine and serotonin in your brain, substances that help regulate mood, behaviors, and thoughts. It belongs to a group of medications called antipsychotics. Antipsychotic medications can be used to treat several kinds of mental health conditions. This medicine may be used for other purposes; ask your health care provider or pharmacist if you have questions. COMMON BRAND NAME(S): Geodon What should I tell my care team before I take this medication? They need to know if you have any of these conditions: Dementia Diabetes Difficulty swallowing Heart disease Heart failure History of breast cancer History of irregular heartbeat History of stroke Liver disease Low blood counts, like low white cell, platelet, or red cell counts Low blood pressure Parkinson's disease Seizures Suicidal thoughts, plans or attempt; a previous suicide attempt by you or a family member An unusual or allergic reaction to ziprasidone, other medications, foods, dyes, or preservatives Pregnant or trying to get pregnant Breast-feeding How should I use this medication? Take this medication by mouth with water. Take it as directed on the prescription label at the same time every day. Do not cut, crush, or chew this medication. Swallow the capsules whole. Take it with food. Keep taking it unless your care team tells you to stop. Talk to your care team about the use of this medication in children. Special care may be needed. Overdosage: If you think you have taken too much of this medicine contact a poison control center or emergency room at once. NOTE: This medicine is only for you. Do not share  this medicine with others. What if I miss a dose? If you miss a dose, take it as soon as you can. If it is almost time for your next dose, take only that dose. Do not take double or extra doses. What may interact with this medication? Do not take this medication with any of the following: Arsenic trioxide Certain antibiotics like gatifloxacin, moxifloxacin, sparfloxacin Certain medications for irregular heart beat like amiodarone, dofetilide, flecainide, procainamide, quinidine, sotalol Chlorpromazine Cisapride Dextromethorphan; quinidine Dolasetron Dronedarone Droperidol Halofantrine Levomethadyl Ketoconazole Mefloquine Mesoridazine Metoclopramide Other medications that prolong the QT interval (cause an abnormal heart rhythm) Pentamidine Pimozide Probucol Tacrolimus Thioridazine This medication may also interact with the following: Alcohol Antihistamines for allergy, cough, and cold Carbamazepine Certain medications for anxiety or sleep Certain medications for depression like amitriptyline, fluoxetine, sertraline General anesthetics like halothane, isoflurane, methoxyflurane, propofol Levodopa or other medications for Parkinson's disease Medications for blood pressure Medications for seizures Medications that relax muscles for surgery Narcotic medications for pain Phenothiazines like perphenazine, prochlorperazine, trifluoperazine This list may not describe all possible interactions. Give your health care provider a list of all the medicines, herbs, non-prescription drugs, or dietary supplements you use. Also tell them if you smoke, drink alcohol, or use illegal drugs. Some items may interact with your medicine. What should I watch for while using this medication? Visit your care team for regular checks on your progress. Tell your care team if symptoms do not start to get better or if they get worse. Do not stop taking except on your care team's advice. You may develop a  severe reaction. Your care team will tell you how much medication to take. This medication  may cause serious skin reactions. They can happen weeks to months after starting the medication. Contact your care team right away if you notice fevers or flu-like symptoms with a rash. The rash may be red or purple and then turn into blisters or peeling of the skin. Or, you might notice a red rash with swelling of the face, lips or lymph nodes in your neck or under your arms. You may get drowsy or dizzy. Do not drive, use machinery, or do anything that needs mental alertness until you know how this medication affects you. Do not stand or sit up quickly, especially if you are an older patient. This reduces the risk of dizzy or fainting spells. Alcohol may interfere with the effect of this medication. Avoid alcoholic drinks. This medication can make you more sensitive to the sun. Keep out of the sun. If you cannot avoid being in the sun, wear protective clothing and use sunscreen. Do not use sun lamps or tanning beds/booths. This medication may increase blood sugar. Ask your care team if changes in diet or medications are needed if you have diabetes. This medication can cause problems with controlling your body temperature. It can lower the response of your body to cold temperatures. If possible, stay indoors during cold weather. If you must go outdoors, wear warm clothes. It can also lower the response of your body to heat. Do not overheat. Do not over-exercise. Stay out of the sun when possible. If you must be in the sun, wear cool clothing. Drink plenty of water. If you have trouble controlling your body temperature, call your care team right away. Your mouth may get dry. Chewing sugarless gum or sucking hard candy, and drinking plenty of water may help. Contact your care team if the problem does not go away or is severe. What side effects may I notice from receiving this medication? Side effects that you should  report to your care team as soon as possible: Allergic reactions--skin rash, itching, hives, swelling of the face, lips, tongue, or throat Heart rhythm changes--fast or irregular heartbeat, dizziness, feeling faint or lightheaded, chest pain, trouble breathing High blood sugar (hyperglycemia)--increased thirst or amount of urine, unusual weakness or fatigue, blurry vision High fever, stiff muscles, increased sweating, fast or irregular heartbeat, and confusion, which may be signs of neuroleptic malignant syndrome High prolactin level--unexpected breast tissue growth, discharge from the nipple, change in sex drive or performance, irregular menstrual cycle Infection--fever, chills, cough, or sore throat Low blood pressure--dizziness, feeling faint or lightheaded, blurry vision Pain or trouble swallowing Prolonged or painful erection Rash, fever, and swollen lymph nodes Redness, blistering, peeling, or loosening of the skin, including inside the mouth Thoughts of suicide or self-harm, worsening mood, feelings of depression Uncontrolled and repetitive body movements, muscle stiffness or spasms, tremors or shaking, loss of balance or coordination, restlessness, shuffling walk, which may be signs of extrapyramidal symptoms (EPS) Side effects that usually do not require medical attention (report to your care team if they continue or are bothersome): Constipation Dizziness Drowsiness Headache Nausea Upset stomach Weight gain This list may not describe all possible side effects. Call your doctor for medical advice about side effects. You may report side effects to FDA at 1-800-FDA-1088. Where should I keep my medication? Keep out of the reach of children and pets. Store at room temperature between 15 and 30 degrees C (59 and 86 degrees F). Get rid of any unused medication after the expiration date. To get rid of medications that  are no longer needed or have expired: Take the medication to a  medication take-back program. Check with your pharmacy or law enforcement to find a location. If you cannot return the medication, check the label or package insert to see if the medication should be thrown out in the garbage or flushed down the toilet. If you are not sure, ask your care team. If it is safe to put it in the trash, take the medication out of the container. Mix the medication with cat litter, dirt, coffee grounds, or other unwanted substance. Seal the mixture in a bag or container. Put it in the trash. NOTE: This sheet is a summary. It may not cover all possible information. If you have questions about this medicine, talk to your doctor, pharmacist, or health care provider.  2022 Elsevier/Gold Standard (2020-10-04 00:00:00)

## 2021-04-04 ENCOUNTER — Telehealth (HOSPITAL_COMMUNITY): Payer: Self-pay | Admitting: Licensed Clinical Social Worker

## 2021-04-04 ENCOUNTER — Other Ambulatory Visit (HOSPITAL_COMMUNITY): Payer: Self-pay | Attending: Psychiatry | Admitting: Licensed Clinical Social Worker

## 2021-04-04 DIAGNOSIS — F331 Major depressive disorder, recurrent, moderate: Secondary | ICD-10-CM

## 2021-04-04 DIAGNOSIS — F411 Generalized anxiety disorder: Secondary | ICD-10-CM

## 2021-04-04 DIAGNOSIS — F29 Unspecified psychosis not due to a substance or known physiological condition: Secondary | ICD-10-CM
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
## 2021-04-04 NOTE — Plan of Care (Signed)
Pt agrees to tx plan °

## 2021-04-05 ENCOUNTER — Other Ambulatory Visit: Payer: Self-pay

## 2021-04-05 ENCOUNTER — Ambulatory Visit
Admission: RE | Admit: 2021-04-05 | Discharge: 2021-04-05 | Disposition: A | Discharge status: Home / Self Care | Payer: BC Managed Care – PPO | Source: Ambulatory Visit | Attending: Psychiatry | Admitting: Psychiatry

## 2021-04-05 ENCOUNTER — Telehealth: Payer: Self-pay | Admitting: Psychiatry

## 2021-04-05 DIAGNOSIS — F331 Major depressive disorder, recurrent, moderate: Secondary | ICD-10-CM | POA: Insufficient documentation

## 2021-04-05 DIAGNOSIS — Z9189 Other specified personal risk factors, not elsewhere classified: Secondary | ICD-10-CM | POA: Diagnosis not present

## 2021-04-05 NOTE — Telephone Encounter (Signed)
Reviewed and discussed EKG-sinus tachycardia-patient to contact primary care provider to discuss further. ? ?Will not make any medication changes today will continue the same.  Discussed the same with patient. ? ?Patient reported she will start PHP program on Monday. ?

## 2021-04-09 ENCOUNTER — Telehealth (HOSPITAL_COMMUNITY): Payer: Self-pay | Admitting: Licensed Clinical Social Worker

## 2021-04-09 ENCOUNTER — Other Ambulatory Visit (HOSPITAL_COMMUNITY): Payer: Self-pay

## 2021-04-10 ENCOUNTER — Other Ambulatory Visit: Payer: Self-pay

## 2021-04-10 ENCOUNTER — Other Ambulatory Visit (HOSPITAL_COMMUNITY): Payer: Self-pay | Admitting: Licensed Clinical Social Worker

## 2021-04-10 ENCOUNTER — Other Ambulatory Visit (HOSPITAL_COMMUNITY): Payer: Self-pay

## 2021-04-10 ENCOUNTER — Telehealth (HOSPITAL_COMMUNITY): Payer: Self-pay | Admitting: Licensed Clinical Social Worker

## 2021-04-10 DIAGNOSIS — F411 Generalized anxiety disorder: Secondary | ICD-10-CM

## 2021-04-10 DIAGNOSIS — F331 Major depressive disorder, recurrent, moderate: Secondary | ICD-10-CM

## 2021-04-11 ENCOUNTER — Other Ambulatory Visit (HOSPITAL_COMMUNITY): Payer: Self-pay

## 2021-04-11 ENCOUNTER — Ambulatory Visit (HOSPITAL_COMMUNITY): Payer: Self-pay

## 2021-04-12 ENCOUNTER — Ambulatory Visit (HOSPITAL_COMMUNITY): Payer: Self-pay

## 2021-04-12 ENCOUNTER — Other Ambulatory Visit (HOSPITAL_COMMUNITY): Payer: Self-pay

## 2021-04-13 ENCOUNTER — Other Ambulatory Visit (HOSPITAL_COMMUNITY): Payer: Self-pay

## 2021-04-13 ENCOUNTER — Ambulatory Visit (HOSPITAL_COMMUNITY): Payer: Self-pay

## 2021-04-16 ENCOUNTER — Ambulatory Visit (HOSPITAL_COMMUNITY): Payer: Self-pay

## 2021-04-16 ENCOUNTER — Other Ambulatory Visit (HOSPITAL_COMMUNITY): Payer: Self-pay

## 2021-04-17 ENCOUNTER — Other Ambulatory Visit: Payer: Self-pay

## 2021-04-17 ENCOUNTER — Ambulatory Visit (INDEPENDENT_AMBULATORY_CARE_PROVIDER_SITE_OTHER): Payer: BC Managed Care – PPO | Admitting: Psychiatry

## 2021-04-17 ENCOUNTER — Encounter: Payer: Self-pay | Admitting: Psychiatry

## 2021-04-17 ENCOUNTER — Ambulatory Visit (HOSPITAL_COMMUNITY): Payer: Self-pay

## 2021-04-17 VITALS — BP 144/85 | HR 83 | Temp 98.3°F | Wt 280.6 lb

## 2021-04-17 DIAGNOSIS — F333 Major depressive disorder, recurrent, severe with psychotic symptoms: Secondary | ICD-10-CM

## 2021-04-17 DIAGNOSIS — F5105 Insomnia due to other mental disorder: Secondary | ICD-10-CM | POA: Diagnosis not present

## 2021-04-17 DIAGNOSIS — F411 Generalized anxiety disorder: Secondary | ICD-10-CM | POA: Diagnosis not present

## 2021-04-17 DIAGNOSIS — F603 Borderline personality disorder: Secondary | ICD-10-CM | POA: Diagnosis not present

## 2021-04-17 NOTE — Progress Notes (Signed)
[facility] MD OP Progress Note ? ?04/17/2021 12:54 PM ?Shari [name] More  ?MRN:  [id number] ? ?Chief Complaint:  ?Chief Complaint  ?Patient presents with  ? Follow-up: 26 year old African-American female with history of MDD, GAD, insomnia, personality disorder, presented for medication management.  ? ?HPI: Shari [name] is a 26 year old African-American female, employed, lives in [place], has a history of MDD, GAD, insomnia, type 2 diabetes mellitus was evaluated in office today. ? ?Patient on 04/03/2021 was referred for partial hospitalization program. ?As per notes per Shari [name] dated 04/10/2021-patient has not been compliant with partial hospitalization program. ? ?Patient today returns reporting that she did not feel the partial hospitalization program group was the right fit for her.  Patient also reports she was scared to take time off from work which also made her more anxious. ? ?Patient today reports she also did not start the [place] which was started last visit since she was worried about the side effects. ? ?Patient continues to struggle with depressive symptoms, sadness, low motivation, concentration problems, low energy.  Reports she has been sleeping well since the past few days. ? ?Currently denies any auditory or visual hallucinations.  Patient does have a history of paranoia especially in stressful situation, like her work.  Continues to have paranoia especially when she is at work.  Feels her colleagues are talking behind her back. ? ?Patient denies any suicidality at this time.  She however does have a history of having passive SI.  Reports she does go through phases when she feels she does not want to be here.  However denies any active plans. ? ?Patient is interested in inpatient mental health admission, reports she will go to the nearest emergency department since there has been no changes in her mood. ? ?She is interested in finding a new job. ? ?Denies any other concerns today. ? ?Visit  Diagnosis:  ?  ICD-10-CM   ?1. Severe episode of recurrent major depressive disorder, with psychotic features ([name])  F33.3   ?  ?2. GAD (generalized anxiety disorder)  F41.1   ?  ?3. Insomnia due to mental condition  F51.05   ? Mood  ?  ?4. Borderline personality disorder ([name])  F60.3   ?  ? ? ?Past Psychiatric History: Reviewed past psychiatric history from progress note on 02/12/2018.  Past trials of Wellbutrin, BuSpar. ? ?Past Medical History:  ?Past Medical History:  ?Diagnosis Date  ? Anxiety   ? Asthma   ? well controlled  ? Diabetes mellitus without complication ([place])   ? pt states she went off her meds because her numbers were fine  ? Endometriosis   ? Hidradenitis suppurativa 12/31 /2019  ? Pneumonia 01/2017  ? Refusal of blood product   ?  ?Past Surgical History:  ?Procedure Laterality Date  ? ADENOIDECTOMY Bilateral 02/09/2018  ? Procedure: REVISION ADENOIDECTOMY;  Surgeon: Shari Manner, MD;  Location: [facility] ORS;  Service: ENT;  Laterality: Bilateral;  ? EYE SURGERY    ? MYRINGOTOMY WITH TUBE PLACEMENT Right 02/09/2018  ? Procedure: MYRINGOTOMY WITH TUBE PLACEMENT;  Surgeon: Shari Manner, MD;  Location: [facility] OR[facility];  Service: ENT;  Laterality: Right;  ? NASOPHARYNGOSCOPY EUSTATION TUBE BALLOON DILATION Bilateral 02/09/2018  ? Procedure: NASOPHARYNGOSCOPY EUSTATION TUBE BALLOON DILATION;  Surgeon: Shari Manner, MD;  Location: [facility] ORS;  Service: ENT;  Laterality: Bilateral;  ? TONSILLECTOMY    ? TURBINATE REDUCTION Right 02/09/2018  ? Procedure: OUTFRACTURE RIGHT INFERIOR TURBINATE;  Surgeon: Shari Manner, MD;  Location: [facility] ORS;  Service: ENT;  Laterality: Right;  ? ? ?Family Psychiatric History: Reviewed family psychiatric history from progress note on 02/12/2018. ? ?Family History:  ?Family History  ?Problem Relation Age of Onset  ? Anxiety disorder Mother   ? Depression Mother   ? Alcohol abuse Father   ? Drug abuse Father   ? Schizophrenia Maternal Uncle   ? ? ?Social History: Reviewed  social history from progress note on 02/12/2018. ?Social History  ? ?Socioeconomic History  ? Marital status: Single  ?  Spouse name: Not on file  ? Number of children: 0  ? Years of education: Not on file  ? Highest education level: Associate degree: occupational, Hotel manager, or vocational program  ?Occupational History  ? Not on file  ?Tobacco Use  ? Smoking status: Never  ? Smokeless tobacco: Never  ?Vaping Use  ? Vaping Use: Never used  ?Substance and Sexual Activity  ? Alcohol use: Not Currently  ?  Comment: rare  ? Drug use: Yes  ?  Types: Marijuana  ? Sexual activity: Yes  ?  Partners: Male  ?  Birth control/protection: Implant, Condom  ?Other Topics Concern  ? Not on file  ?Social History Narrative  ? Not on file  ? ?Social Determinants of Health  ? ?Financial Resource Strain: Not on file  ?Food Insecurity: Not on file  ?Transportation Needs: Not on file  ?Physical Activity: Not on file  ?Stress: Not on file  ?Social Connections: Not on file  ? ? ?Allergies:  ?Allergies  ?Allergen Reactions  ? Sulfa Antibiotics Hives  ? Tramadol Other (See Comments)  ?  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 ?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 ? ?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 ?depression ? ?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 ? ?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 ?depression ?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 ?depression  ? ? ?Metabolic Disorder Labs: ?Lab Results  ?Component Value Date  ? HGBA1C 6.0 (H) 12/07/2015  ? MPG 126 12/07/2015  ? ?Lab Results  ?Component Value Date  ? PROLACTIN 14.1 12/07/2015  ? ?Lab Results  ?Component Value Date  ? CHOL 321 (H) 12/07/2015  ? TRIG 71 12/07/2015  ? HDL 41 12/07/2015  ? CHOLHDL 7.8 12/07/2015  ? VLDL 14 12/07/2015  ? LDLCALC 266 (H) 12/07/2015  ? ?Lab Results  ?Component Value Date  ? TSH 1.265 12/07/2015  ? ? ?Therapeutic Level Labs: ?No results found for: LITHIUM ?No results found for: VALPROATE ?No components found for:  CBMZ ? ?Current Medications: ?Current Outpatient  Medications  ?Medication Sig Dispense Refill  ? azelastine (ASTELIN) 0.1 % nasal spray Place into the nose.    ? BINAXNOW COVID-19 AG HOME TEST KIT Use as Directed on the Package    ? bismuth subsalicylate (PEPTO BISMOL) 262 MG/15ML suspension Take 30 mLs by mouth every 6 (six) hours as needed for indigestion or diarrhea or loose stools.    ? Blood Glucose Monitoring Suppl (FIFTY50 GLUCOSE METER 2.0) w/Device KIT Use as instructed. Ok to dispense whichever glucometer is covered by insurance E11.65    ? Dulaglutide 0.75 MG/0.5ML SOPN Inject into the skin.    ? etonogestrel (NEXPLANON) 68 MG IMPL implant 68 mg by Subdermal route once.     ? exenatide (BYETTA 10 MCG PEN) 10 MCG/0.04ML SOPN injection Inject 10 mcg into the skin 2 (two) times daily with a meal.    ? ezetimibe (ZETIA) 10 MG tablet Take 10 mg by mouth daily.    ?  FEROSUL 325 (65 Fe) MG tablet Take 325 mg by mouth daily.    ? fluconazole (DIFLUCAN) 150 MG tablet Take 1 tablet (150 mg total) by mouth daily. 1 tablet 1  ? fluticasone (FLONASE) 50 MCG/ACT nasal spray Place into both nostrils.    ? Fluticasone-Salmeterol (ADVAIR) 250-50 MCG/DOSE AEPB fluticasone 250 mcg-salmeterol 50 mcg/dose blistr powdr for inhalation    ? gabapentin (NEURONTIN) 300 MG capsule Take by mouth.    ? glucose blood (PRECISION QID TEST) test strip Dispense 100 blood glucose test strips, ok to sub any brand preferred by insurance/patient to match with meter, use 3x/day, dx E11.65    ? Insulin Degludec (TRESIBA FLEXTOUCH) 200 UNIT/ML SOPN Inject into the skin.    ? insulin glargine (LANTUS SOLOSTAR) 100 UNIT/ML Solostar Pen Use up to 60 units daily    ? insulin lispro (HUMALOG) 100 UNIT/ML KwikPen Inject 10 units before dinner    ? JARDIANCE 25 MG TABS tablet     ? ketoconazole (NIZORAL) 2 % cream Apply 1 application topically 2 (two) times daily.    ? letrozole (FEMARA) 2.5 MG tablet     ? metFORMIN (GLUCOPHAGE-XR) 500 MG 24 hr tablet     ? methylPREDNISolone (MEDROL DOSEPAK) 4 MG  TBPK tablet Take Tapered dose as directed 21 tablet 0  ? metroNIDAZOLE (FLAGYL) 500 MG tablet Take 500 mg by mouth 2 (two) times daily.    ? montelukast (SINGULAIR) 10 MG tablet TAKE 1 TABLET BY MOUTH ONCE DAILY FOR A

## 2021-04-18 ENCOUNTER — Other Ambulatory Visit (HOSPITAL_COMMUNITY): Payer: Self-pay

## 2021-04-18 ENCOUNTER — Ambulatory Visit (HOSPITAL_COMMUNITY): Payer: Self-pay

## 2021-04-19 ENCOUNTER — Ambulatory Visit (HOSPITAL_COMMUNITY): Payer: Self-pay

## 2021-04-19 ENCOUNTER — Other Ambulatory Visit (HOSPITAL_COMMUNITY): Payer: Self-pay

## 2021-04-20 ENCOUNTER — Other Ambulatory Visit (HOSPITAL_COMMUNITY): Payer: Self-pay

## 2021-04-20 ENCOUNTER — Ambulatory Visit (HOSPITAL_COMMUNITY): Payer: Self-pay

## 2021-04-23 ENCOUNTER — Ambulatory Visit (HOSPITAL_COMMUNITY): Payer: Self-pay

## 2021-04-23 ENCOUNTER — Other Ambulatory Visit (HOSPITAL_COMMUNITY): Payer: Self-pay

## 2021-04-24 ENCOUNTER — Other Ambulatory Visit (HOSPITAL_COMMUNITY): Payer: Self-pay

## 2021-04-24 ENCOUNTER — Other Ambulatory Visit: Payer: Self-pay

## 2021-04-24 ENCOUNTER — Ambulatory Visit (INDEPENDENT_AMBULATORY_CARE_PROVIDER_SITE_OTHER): Payer: BC Managed Care – PPO | Admitting: Psychiatry

## 2021-04-24 ENCOUNTER — Encounter: Payer: Self-pay | Admitting: Psychiatry

## 2021-04-24 ENCOUNTER — Ambulatory Visit (HOSPITAL_COMMUNITY): Payer: Self-pay

## 2021-04-24 VITALS — BP 131/83 | HR 96 | Temp 97.9°F | Wt 276.8 lb

## 2021-04-24 DIAGNOSIS — F1221 Cannabis dependence, in remission: Secondary | ICD-10-CM

## 2021-04-24 DIAGNOSIS — F603 Borderline personality disorder: Secondary | ICD-10-CM | POA: Diagnosis not present

## 2021-04-24 DIAGNOSIS — F5105 Insomnia due to other mental disorder: Secondary | ICD-10-CM

## 2021-04-24 DIAGNOSIS — F3342 Major depressive disorder, recurrent, in full remission: Secondary | ICD-10-CM

## 2021-04-24 DIAGNOSIS — F1994 Other psychoactive substance use, unspecified with psychoactive substance-induced mood disorder: Secondary | ICD-10-CM | POA: Insufficient documentation

## 2021-04-24 DIAGNOSIS — F411 Generalized anxiety disorder: Secondary | ICD-10-CM | POA: Diagnosis not present

## 2021-04-24 MED ORDER — SERTRALINE HCL 100 MG PO TABS: 200.0000 mg | ORAL_TABLET | Freq: Every day | ORAL | 2 refills | Status: DC

## 2021-04-24 MED ORDER — HYDROXYZINE HCL 10 MG PO TABS: 10.0000 mg | ORAL_TABLET | Freq: Three times a day (TID) | ORAL | 1 refills | Status: DC | PRN

## 2021-04-24 NOTE — Progress Notes (Signed)
[facility] MD OP Progress Note ? ?04/24/2021 1:41 PM ?Shari [name]  ?MRN:  [id number] ? ?Chief Complaint:  ?Chief Complaint  ?Patient presents with  ? Follow-up: 26 year old African-American female with history of MDD, GAD, insomnia, personality disorder, presented for medication management.  ? ?HPI: Shari [name] is a 26 year old African-American female,single, employed, lives in [place], has a history of MDD, GAD, insomnia type 2 diabetes mellitus was evaluated in office today. ? ?Patient today presented late for her appointment. ? ?Patient today reports she currently feels much better with regards to her mood.  Patient completed the PHQ-9 and scored 0 today.  This is a huge difference compared to her score few days ago. ? ?Patient today reports that she wants to be honest with Probation officer and reports she has been abusing cannabis since 2021.  She reports it may have started in summer 2021 when she started gradually smoking it.  She reports she had relationship struggles with her ex-boyfriend who gave her ' herpes" in May 2022.  She started smoking cannabis heavily after that.  She reports she has been using 1-2 blunts daily.  She has been carrying into her work and has been smoking it during her breaks as well as first in the morning, and at bedtime.  Patient reports it got to a point that she felt she should not be doing so much and then 2 weeks ago she decided to quit smoking.  She reports it was hard in the beginning however she wants to stay sober. ? ?Patient reports she also has been noncompliant with her medications including the insulin, Geodon as well as the Zoloft.  She reports she is not good with taking her medications .  Patient however reports she would like to make some changes in her life and is interested in start taking her medications daily. ? ?Patient denies any hallucinations at this time. ? ?Patient denies any suicidality, homicidality. ? ?Reports she continues to have problems at her work.  She  reports she is trying to find a new job. ? ?Patient is agreeable to continuing psychotherapy sessions has upcoming visits with her therapist. ? ?Reports good support system from her current boyfriend, they have been together since the past 4 months.  She is interested in having a baby however reports her OB/GYN wants her to wait until her diabetes is under control. ? ? ? ?Visit Diagnosis: R/O Substance induced mood disorder ( cannabis)  ?  ICD-10-CM   ?1. MDD (major depressive disorder), recurrent, in full remission ([place])  F33.42 hydrOXYzine (ATARAX) 10 MG tablet  ?  ?2. GAD (generalized anxiety disorder)  F41.1 sertraline (ZOLOFT) 100 MG tablet  ?  hydrOXYzine (ATARAX) 10 MG tablet  ?  ?3. Insomnia due to mental condition  F51.05 hydrOXYzine (ATARAX) 10 MG tablet  ? mood  ?  ?4. Borderline personality disorder ([place])  F60.3   ?  ?5. Cannabis use disorder, severe, in early remission [facility])  F12.21   ?  ? ? ?Past Psychiatric History: Reviewed past psychiatric history from progress note on 02/12/2018.  Past trials of Wellbutrin, BuSpar. ? ?Past Medical History:  ?Past Medical History:  ?Diagnosis Date  ? Anxiety   ? Asthma   ? well controlled  ? Diabetes mellitus without complication ([place])   ? pt states she went off her meds because her numbers were fine  ? Endometriosis   ? Hidradenitis suppurativa 12/31 /2019  ? Pneumonia 01/2017  ? Refusal of blood product   ?  ?Past  Surgical History:  ?Procedure Laterality Date  ? ADENOIDECTOMY Bilateral 02/09/2018  ? Procedure: REVISION ADENOIDECTOMY;  Surgeon: Carloyn Manner, MD;  Location: [facility] ORS;  Service: ENT;  Laterality: Bilateral;  ? EYE SURGERY    ? MYRINGOTOMY WITH TUBE PLACEMENT Right 02/09/2018  ? Procedure: MYRINGOTOMY WITH TUBE PLACEMENT;  Surgeon: Carloyn Manner, MD;  Location: [facility] OR[facility];  Service: ENT;  Laterality: Right;  ? NASOPHARYNGOSCOPY EUSTATION TUBE BALLOON DILATION Bilateral 02/09/2018  ? Procedure: NASOPHARYNGOSCOPY EUSTATION TUBE BALLOON DILATION;   Surgeon: Carloyn Manner, MD;  Location: [facility] OR[facility];  Service: ENT;  Laterality: Bilateral;  ? TONSILLECTOMY    ? TURBINATE REDUCTION Right 02/09/2018  ? Procedure: OUTFRACTURE RIGHT INFERIOR TURBINATE;  Surgeon: Carloyn Manner, MD;  Location: [facility] OR[facility];  Service: ENT;  Laterality: Right;  ? ?Substance abuse history :Patient reports abusing cannabis since 2021, heavy use since May 2022.  Reports she was using 1-2 blunts every day, would smoke first in the morning, during breaks at work as well as at bedtime.  She reports she stopped smoking 2 weeks ago-reported on 04/24/2021. ? ? ?Family Psychiatric History: Reviewed family psychiatric history from progress note on 02/12/2018. ? ?Family History:  ?Family History  ?Problem Relation Age of Onset  ? Anxiety disorder Mother   ? Depression Mother   ? Alcohol abuse Father   ? Drug abuse Father   ? Schizophrenia Maternal Uncle   ? ? ?Social History: Reviewed social history from progress note on 02/12/2018. ?Social History  ? ?Socioeconomic History  ? Marital status: Single  ?  Spouse name: Not on file  ? Number of children: 0  ? Years of education: Not on file  ? Highest education level: Associate degree: occupational, Hotel manager, or vocational program  ?Occupational History  ? Not on file  ?Tobacco Use  ? Smoking status: Never  ? Smokeless tobacco: Never  ?Vaping Use  ? Vaping Use: Never used  ?Substance and Sexual Activity  ? Alcohol use: Not Currently  ?  Comment: rare  ? Drug use: Yes  ?  Types: Marijuana  ? Sexual activity: Yes  ?  Partners: Male  ?  Birth control/protection: Implant, Condom  ?Other Topics Concern  ? Not on file  ?Social History Narrative  ? Not on file  ? ?Social Determinants of Health  ? ?Financial Resource Strain: Not on file  ?Food Insecurity: Not on file  ?Transportation Needs: Not on file  ?Physical Activity: Not on file  ?Stress: Not on file  ?Social Connections: Not on file  ? ? ?Allergies:  ?Allergies  ?Allergen Reactions  ? Sulfa Antibiotics  Hives  ? Tramadol Other (See Comments)  ?  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 ?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 ? ?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 ?depression ? ?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 ? ?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 ?depression ?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 ?depression  ? ? ?Metabolic Disorder Labs: ?Lab Results  ?Component Value Date  ? HGBA1C 6.0 (H) 12/07/2015  ? MPG 126 12/07/2015  ? ?Lab Results  ?Component Value Date  ? PROLACTIN 14.1 12/07/2015  ? ?Lab Results  ?Component Value Date  ? CHOL 321 (H) 12/07/2015  ? TRIG 71 12/07/2015  ? HDL 41 12/07/2015  ? CHOLHDL 7.8 12/07/2015  ? VLDL 14 12/07/2015  ? LDLCALC 266 (H) 12/07/2015  ? ?Lab Results  ?Component Value Date  ? TSH 1.265 12/07/2015  ? ? ?Therapeutic Level Labs: ?No results found for: LITHIUM ?No results found  for: VALPROATE ?No components found for:  CBMZ ? ?Current Medications: ?Current Outpatient Medications  ?Medication Sig Dispense Refill  ? azelastine (ASTELIN) 0.1 % nasal spray Place into the nose.    ? BINAXNOW COVID-19 AG HOME TEST KIT Use as Directed on the Package    ? bismuth subsalicylate (PEPTO BISMOL) 262 MG/15ML suspension Take 30 mLs by mouth every 6 (six) hours as needed for indigestion or diarrhea or loose stools.    ? Blood Glucose Monitoring Suppl (FIFTY50 GLUCOSE METER 2.0) w/Device KIT Use as instructed. Ok to dispense whichever glucometer is covered by insurance E11.65    ? Dulaglutide 0.75 MG/0.5ML SOPN Inject into the skin.    ? etonogestrel (NEXPLANON) 68 MG IMPL implant 68 mg by Subdermal route once.     ? exenatide (BYETTA 10 MCG PEN) 10 MCG/0.04ML SOPN injection Inject 10 mcg into the skin 2 (two) times daily with a meal.    ? ezetimibe (ZETIA) 10 MG tablet Take 10 mg by mouth daily.    ? FEROSUL 325 (65 Fe) MG tablet Take 325 mg by mouth daily.    ? FIASP FLEXTOUCH 100 UNIT/ML FlexTouch Pen Inject into the skin.    ? fluconazole (DIFLUCAN) 150 MG tablet Take 1 tablet (150 mg total) by  mouth daily. 1 tablet 1  ? fluticasone (FLONASE) 50 MCG/ACT nasal spray Place into both nostrils.    ? Fluticasone-Salmeterol (ADVAIR) 250-50 MCG/DOSE AEPB fluticasone 250 mcg-salmeterol 50 mcg/dose blistr powdr

## 2021-04-25 ENCOUNTER — Other Ambulatory Visit (HOSPITAL_COMMUNITY): Payer: Self-pay

## 2021-04-25 ENCOUNTER — Ambulatory Visit (HOSPITAL_COMMUNITY): Payer: Self-pay

## 2021-04-26 ENCOUNTER — Ambulatory Visit (HOSPITAL_COMMUNITY): Payer: Self-pay

## 2021-04-26 ENCOUNTER — Other Ambulatory Visit (HOSPITAL_COMMUNITY): Payer: Self-pay

## 2021-04-27 ENCOUNTER — Ambulatory Visit (HOSPITAL_COMMUNITY): Payer: Self-pay

## 2021-04-27 ENCOUNTER — Other Ambulatory Visit (HOSPITAL_COMMUNITY): Payer: Self-pay

## 2021-05-01 ENCOUNTER — Ambulatory Visit (INDEPENDENT_AMBULATORY_CARE_PROVIDER_SITE_OTHER): Payer: BC Managed Care – PPO | Admitting: Licensed Clinical Social Worker

## 2021-05-01 DIAGNOSIS — F411 Generalized anxiety disorder: Secondary | ICD-10-CM

## 2021-05-01 DIAGNOSIS — F3342 Major depressive disorder, recurrent, in full remission: Secondary | ICD-10-CM | POA: Diagnosis not present

## 2021-05-01 NOTE — Progress Notes (Signed)
Virtual Visit via Audio Note ? ?I connected with Shari [name] on 05/01/21 at  2:00 PM EDT by an a audio enabled telemedicine application and verified that I am speaking with the correct person using two identifiers. ? ?Video connection was lost when less than 50% of the duration of the visit was complete, at which time the remainder of the visit was completed via audio only. ? ?Location: ?Patient: home ?Provider: remote office [place], [place]) ?  ?I discussed the limitations of evaluation and management by telemedicine and the availability of in person appointments. The patient expressed understanding and agreed to proceed. ?  ?I discussed the assessment and treatment plan with the patient. The patient was provided an opportunity to ask questions and all were answered. The patient agreed with the plan and demonstrated an understanding of the instructions. ?  ?The patient was advised to call back or seek an in-person evaluation if the symptoms worsen or if the condition fails to improve as anticipated. ? ?I provided 40 minutes of non-face-to-face time during this encounter. ? ? ?Lior Cartelli R Micky Overturf, LCSW ? ? ?THERAPIST PROGRESS NOTE ? ?Session Time: 2-240p ? ?Participation Level: Active ? ?Behavioral Response: NAAlertAnxious ? ?Type of Therapy: Individual Therapy ? ?Treatment Goals addressed:  ?Problem: Decrease depressive symptoms and improve levels of effective functioning ? ?Goal: LTG: Reduce frequency, intensity, and duration of depression symptoms as evidenced by: SSB input needed on appropriate metric--pt self report ?Outcome: Not Progressing ?Note: Pt presenting with irritable affect at today's session ? ?Goal: STG: '@PREFFIRSTNAME'$ @ will participate in at least 80% of scheduled individual psychotherapy sessions ?Outcome: Progressing ?Note: Pt on time and engaged throughout session--pt engaged for 40 minutes versus 15 at last session ? ?Interventions:  ? ?Intervention: Encourage patient to identify  triggers ? ?Intervention: Assess emotional status and coping mechanisms ? ?Intervention: REVIEW PLEASE SKILLS (TREAT PHYSICAL ILLNESS, BALANCE EATING, AVOID MOOD-ALTERING SUBSTANCES, BALANCE SLEEP AND GET EXERCISE) WITH Shari [name] ?  ?Summary: Shari [name] is a 26 y.o. female who presents with continuing symptoms consistent with depression and mild anxiety. Reviewed coping skills that have been helpful since last session.  ? ?Allowed pt to explore and express thoughts and feelings associated with recent life situations and external stressors. Pt reports that she is unsure about happiness of her job and her relationship with her boyfriend. Pt reports at work she feels her colleagues are "cliquish" and are petty about things. Discussed ways that pt could take breaks throughout the day and incorporate mindfulness into her day as a coping strategy for emotion regulation.  Explored pts relationship with boyfriend--pt reports that she is unsure about her relationship because she drove 30 min to see him yesterday and he spent time on the video games and did not pay attention to her. Discussed "the five love languages" and encouraged pt to do some independent research online to discover what her love language is and how SHE feels loved.   ? ?Reviewed the importance of self care to help pt stay balanced while coping with stressors. Pt states that communication will happen with her mother soon.  ? ?Pt requested to end session early.  ? ?Continued recommendations are as follows: self care behaviors, positive social engagements, focusing on overall work/home/life balance, and focusing on positive physical and emotional wellness.  ? ?Suicidal/Homicidal: No ? ?Therapist Response: Pt is continuing to apply interventions learned in session into daily life situations. Pt is currently on track to meet goals utilizing interventions mentioned above. Personal growth and progress noted. Treatment  to continue as indicated.  ? ?Plan: Return  again in 4 weeks. ? ?Diagnosis: Axis I: MDD, recurrent ? ?Collaboration of Care: Other Pt encouraged to continue psychiatric follow ups with psychiatrist--Dr. Shea Evans ? ?Patient/Guardian was advised Release of Information must be obtained prior to any record release in order to collaborate their care with an outside provider. Patient/Guardian was advised if they have not already done so to contact the registration department to sign all necessary forms in order for [place] to release information regarding their care.  ? ?Consent: Patient/Guardian gives verbal consent for treatment and assignment of benefits for services provided during this visit. Patient/Guardian expressed understanding and agreed to proceed.  ?   ? ? ? ?Shari [name] R Shari Metzner, LCSW ?05/01/2021 ? ?

## 2021-05-01 NOTE — Plan of Care (Signed)
?  Problem: Decrease depressive symptoms and improve levels of effective functioning ?Goal: LTG: Reduce frequency, intensity, and duration of depression symptoms as evidenced by: SSB input needed on appropriate metric--pt self report ?Outcome: Not Progressing ?Note: Pt presenting with irritable affect at today's session ?Goal: STG: '@PREFFIRSTNAME'$ @ will participate in at least 80% of scheduled individual psychotherapy sessions ?Outcome: Progressing ?Note: Pt on time and engaged throughout session--pt engaged for 40 minutes versus 15 at last session ?Intervention: Encourage patient to identify triggers ?Intervention: Assess emotional status and coping mechanisms ?Intervention: Identify effective coping behavior ?  ?

## 2021-05-29 ENCOUNTER — Ambulatory Visit: Payer: BC Managed Care – PPO | Admitting: Psychiatry

## 2021-05-30 ENCOUNTER — Ambulatory Visit (INDEPENDENT_AMBULATORY_CARE_PROVIDER_SITE_OTHER): Payer: BC Managed Care – PPO | Admitting: Psychiatry

## 2021-05-30 ENCOUNTER — Ambulatory Visit: Payer: BLUE CROSS/BLUE SHIELD

## 2021-05-30 ENCOUNTER — Encounter: Payer: Self-pay | Admitting: Psychiatry

## 2021-05-30 VITALS — BP 136/85 | HR 102 | Temp 97.5°F | Wt 274.0 lb

## 2021-05-30 DIAGNOSIS — F603 Borderline personality disorder: Secondary | ICD-10-CM | POA: Diagnosis not present

## 2021-05-30 DIAGNOSIS — F1221 Cannabis dependence, in remission: Secondary | ICD-10-CM

## 2021-05-30 DIAGNOSIS — F3342 Major depressive disorder, recurrent, in full remission: Secondary | ICD-10-CM

## 2021-05-30 DIAGNOSIS — F5105 Insomnia due to other mental disorder: Secondary | ICD-10-CM | POA: Diagnosis not present

## 2021-05-30 DIAGNOSIS — F411 Generalized anxiety disorder: Secondary | ICD-10-CM

## 2021-05-30 MED ORDER — SERTRALINE HCL 100 MG PO TABS: 200.0000 mg | ORAL_TABLET | Freq: Every day | ORAL | 2 refills | Status: DC

## 2021-05-30 MED ORDER — HYDROXYZINE HCL 10 MG PO TABS: 10.0000 mg | ORAL_TABLET | Freq: Three times a day (TID) | ORAL | 1 refills | Status: DC | PRN

## 2021-05-30 MED ORDER — ZIPRASIDONE HCL 20 MG PO CAPS: 20.0000 mg | ORAL_CAPSULE | Freq: Every day | ORAL | 2 refills | Status: DC

## 2021-05-30 NOTE — Progress Notes (Signed)
[facility] MD OP Progress Note ? ?05/30/2021 2:57 PM ?Shari [name]  ?MRN:  [id number] ? ?Chief Complaint:  ?Chief Complaint  ?Patient presents with  ? Follow-up: 26 year old African-American female with history of MDD, GAD, insomnia, personality disorder, with recent job loss and separation from boyfriend presented for medication management.  ? ?HPI: Shari [name] is a 26 year old African-American female, single, unemployed, lives in [place], has a history of MDD, GAD, insomnia ,type 2 diabetes mellitus was evaluated in office today. ? ?Patient today reports she is currently separated from her boyfriend.  She reports she broke up with him on April 11.  Patient also reports she quit her job beginning of April.  She reports she could not keep up with the toxic job anymore.  Patient reports she received a text message from her boss late at night and got upset and went in to have a chat with her the next day and decided to quit. ? ?She reports she is currently looking for a new job and has had 5 interviews.  Still waiting.  She is hopeful.  Currently has good support system from her family. ? ?Patient reports overall mood symptoms as currently improving since she is no longer at this job. ? ?Reports sleep is good. ? ?Currently compliant on medications.  Denies side effects. ? ?Patient reports she had a syphilis scare recently since her ex-boyfriend told her that he may have tested positive.  Patient also reports current upper respiratory tract infection symptoms.  Patient hence is currently under the care of her primary care provider.  Currently on doxycycline.  Reports she was also prescribed prednisone and has not started taking it yet. ? ?Patient reports she quit smoking cannabis and continues to stay sober. ? ?Denies any other concerns today. ? ?Visit Diagnosis:  ?  ICD-10-CM   ?1. MDD (major depressive disorder), recurrent, in full remission ([place])  F33.42 hydrOXYzine (ATARAX) 10 MG tablet  ?  ?2. GAD (generalized  anxiety disorder)  F41.1 ziprasidone (GEODON) 20 MG capsule  ?  sertraline (ZOLOFT) 100 MG tablet  ?  hydrOXYzine (ATARAX) 10 MG tablet  ?  ?3. Insomnia due to mental condition  F51.05   ? mood  ?  ?4. Borderline personality disorder ([place])  F60.3   ?  ?5. Cannabis use disorder, severe, in early remission [facility])  F12.21   ?  ? ? ?Past Psychiatric History: Reviewed past psychiatric history from progress note on 02/12/2018.  Past also Wellbutrin, BuSpar. ? ?Past Medical History:  ?Past Medical History:  ?Diagnosis Date  ? Anxiety   ? Asthma   ? well controlled  ? Diabetes mellitus without complication ([place])   ? pt states she went off her meds because her numbers were fine  ? Endometriosis   ? Hidradenitis suppurativa 12/31 /2019  ? Pneumonia 01/2017  ? Refusal of blood product   ?  ?Past Surgical History:  ?Procedure Laterality Date  ? ADENOIDECTOMY Bilateral 02/09/2018  ? Procedure: REVISION ADENOIDECTOMY;  Surgeon: Carloyn Manner, MD;  Location: [facility] ORS;  Service: ENT;  Laterality: Bilateral;  ? EYE SURGERY    ? MYRINGOTOMY WITH TUBE PLACEMENT Right 02/09/2018  ? Procedure: MYRINGOTOMY WITH TUBE PLACEMENT;  Surgeon: Carloyn Manner, MD;  Location: [facility] ORS;  Service: ENT;  Laterality: Right;  ? NASOPHARYNGOSCOPY EUSTATION TUBE BALLOON DILATION Bilateral 02/09/2018  ? Procedure: NASOPHARYNGOSCOPY EUSTATION TUBE BALLOON DILATION;  Surgeon: Carloyn Manner, MD;  Location: [facility] ORS;  Service: ENT;  Laterality: Bilateral;  ? TONSILLECTOMY    ?  TURBINATE REDUCTION Right 02/09/2018  ? Procedure: OUTFRACTURE RIGHT INFERIOR TURBINATE;  Surgeon: Carloyn Manner, MD;  Location: [facility] ORS;  Service: ENT;  Laterality: Right;  ? ? ?Family Psychiatric History: Reviewed family psychiatric history from progress note on 02/12/2018. ? ?Family History:  ?Family History  ?Problem Relation Age of Onset  ? Anxiety disorder Mother   ? Depression Mother   ? Alcohol abuse Father   ? Drug abuse Father   ? Schizophrenia Maternal Uncle    ? ? ?Social History: Reviewed social history from progress note on 02/12/2018. ?Social History  ? ?Socioeconomic History  ? Marital status: Single  ?  Spouse name: Not on file  ? Number of children: 0  ? Years of education: Not on file  ? Highest education level: Associate degree: occupational, Hotel manager, or vocational program  ?Occupational History  ? Not on file  ?Tobacco Use  ? Smoking status: Never  ? Smokeless tobacco: Never  ?Vaping Use  ? Vaping Use: Never used  ?Substance and Sexual Activity  ? Alcohol use: Not Currently  ?  Comment: rare  ? Drug use: Yes  ?  Types: Marijuana  ? Sexual activity: Yes  ?  Partners: Male  ?  Birth control/protection: Implant, Condom  ?Other Topics Concern  ? Not on file  ?Social History Narrative  ? Not on file  ? ?Social Determinants of Health  ? ?Financial Resource Strain: Not on file  ?Food Insecurity: Not on file  ?Transportation Needs: Not on file  ?Physical Activity: Not on file  ?Stress: Not on file  ?Social Connections: Not on file  ? ? ?Allergies:  ?Allergies  ?Allergen Reactions  ? Sulfa Antibiotics Hives  ? Tramadol Other (See Comments)  ?  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 ?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 ? ?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 ?depression ? ?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 ? ?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 ?depression ?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 ?depression  ? ? ?Metabolic Disorder Labs: ?Lab Results  ?Component Value Date  ? HGBA1C 6.0 (H) 12/07/2015  ? MPG 126 12/07/2015  ? ?Lab Results  ?Component Value Date  ? PROLACTIN 14.1 12/07/2015  ? ?Lab Results  ?Component Value Date  ? CHOL 321 (H) 12/07/2015  ? TRIG 71 12/07/2015  ? HDL 41 12/07/2015  ? CHOLHDL 7.8 12/07/2015  ? VLDL 14 12/07/2015  ? LDLCALC 266 (H) 12/07/2015  ? ?Lab Results  ?Component Value Date  ? TSH 1.265 12/07/2015  ? ? ?Therapeutic Level Labs: ?No results found for: LITHIUM ?No results found for: VALPROATE ?No components found for:  CBMZ ? ?Current  Medications: ?Current Outpatient Medications  ?Medication Sig Dispense Refill  ? Blood Glucose Monitoring Suppl (FIFTY50 GLUCOSE METER 2.0) w/Device KIT Use as instructed. Ok to dispense whichever glucometer is covered by insurance E11.65    ? doxycycline (VIBRA-TABS) 100 MG tablet Take 100 mg by mouth 2 (two) times daily.    ? Dulaglutide 0.75 MG/0.5ML SOPN Inject into the skin.    ? etonogestrel (NEXPLANON) 68 MG IMPL implant 68 mg by Subdermal route once.     ? exenatide (BYETTA 10 MCG PEN) 10 MCG/0.04ML SOPN injection Inject 10 mcg into the skin 2 (two) times daily with a meal.    ? ezetimibe (ZETIA) 10 MG tablet Take 10 mg by mouth daily.    ? FEROSUL 325 (65 Fe) MG tablet Take 325 mg by mouth daily.    ? FIASP FLEXTOUCH 100 UNIT/ML  FlexTouch Pen Inject into the skin.    ? fluconazole (DIFLUCAN) 150 MG tablet Take 1 tablet (150 mg total) by mouth daily. 1 tablet 1  ? fluticasone (FLONASE) 50 MCG/ACT nasal spray Place into both nostrils.    ? Fluticasone-Salmeterol (ADVAIR) 250-50 MCG/DOSE AEPB fluticasone 250 mcg-salmeterol 50 mcg/dose blistr powdr for inhalation    ? glucose blood (PRECISION QID TEST) test strip Dispense 100 blood glucose test strips, ok to sub any brand preferred by insurance/patient to match with meter, use 3x/day, dx E11.65    ? Insulin Degludec (TRESIBA FLEXTOUCH) 200 UNIT/ML SOPN Inject into the skin.    ? insulin glargine (LANTUS SOLOSTAR) 100 UNIT/ML Solostar Pen Use up to 60 units daily    ? insulin lispro (HUMALOG) 100 UNIT/ML KwikPen Inject 10 units before dinner    ? JARDIANCE 25 MG TABS tablet     ? ketoconazole (NIZORAL) 2 % cream Apply 1 application topically 2 (two) times daily.    ? letrozole (FEMARA) 2.5 MG tablet     ? metroNIDAZOLE (FLAGYL) 500 MG tablet Take 500 mg by mouth 2 (two) times daily.    ? montelukast (SINGULAIR) 10 MG tablet Take by mouth.    ? omeprazole (PRILOSEC) 20 MG capsule Take 1 capsule by mouth daily.    ? OZEMPIC, 0.25 OR 0.5 MG/DOSE, 2 MG/1.5ML SOPN  Inject 1 mg into the skin.    ? predniSONE (DELTASONE) 10 MG tablet Take 10 mg by mouth daily with breakfast.    ? PROAIR HFA 108 (90 Base) MCG/ACT inhaler Inhale 2 puffs into the lungs every 6 (six) hours as needed for

## 2021-05-30 NOTE — Psych (Signed)
Virtual Visit via Video Note ? ?I connected with [name] on 04/10/21 at  9:00 AM EDT by a video enabled telemedicine application and verified that I am speaking with the correct person using two identifiers. ? ?Location: ?Patient: patient home ?Provider: clinical home office ?  ?I discussed the limitations of evaluation and management by telemedicine and the availability of in person appointments. The patient expressed understanding and agreed to proceed. ? ?History of Present Illness: ?MDD and GAD ?  ?Observations/Objective: ?Pt is scheduled to begin PHP today. Pt presented in depressed and agitated manner. Pt is not forthcoming with information.  Pt states yesterday was "bad" and she feels she is getting no answers from her doctors regarding physical health with her diabetes. Pt reports feelings of anger, defeat, and crying. Pt reports napping to manage. Pt denies SI/HI.  ? ?Assessment and Plan: ?Pt abruptly left group approximately 10:30. Cln C. Hooker followed up via telephone and pt states she does not want to be in group therapy and will not return to [facility]. Pt denies safety issues and SI/HI ? ?Follow Up Instructions: ?Pt will not admit to PHP and will continue with OP providers, C. Hussami and S. Eappen.  ?  ?I discussed the assessment and treatment plan with the patient. The patient was provided an opportunity to ask questions and all were answered. The patient agreed with the plan and demonstrated an understanding of the instructions. ?  ?The patient was advised to call back or seek an in-person evaluation if the symptoms worsen or if the condition fails to improve as anticipated. ? ?Pt was provided 90 minutes of non-face-to-face time during this encounter. ? ? ?Lorin Glass, LCSW ? ? ?

## 2021-06-19 ENCOUNTER — Ambulatory Visit (INDEPENDENT_AMBULATORY_CARE_PROVIDER_SITE_OTHER): Payer: Self-pay | Admitting: Licensed Clinical Social Worker

## 2021-06-19 DIAGNOSIS — F411 Generalized anxiety disorder: Secondary | ICD-10-CM

## 2021-06-19 DIAGNOSIS — F603 Borderline personality disorder: Secondary | ICD-10-CM

## 2021-06-19 DIAGNOSIS — F3342 Major depressive disorder, recurrent, in full remission: Secondary | ICD-10-CM

## 2021-06-19 NOTE — Progress Notes (Signed)
Virtual Visit via Audio Note  I connected with Shari [name] on 06/19/21 at  2:00 PM EDT by an a audio enabled telemedicine application and verified that I am speaking with the correct person using two identifiers.  Video connection was lost when less than 50% of the duration of the visit was complete, at which time the remainder of the visit was completed via audio only.  Location: Patient: home Provider: remote office [place], [place])   I discussed the limitations of evaluation and management by telemedicine and the availability of in person appointments. The patient expressed understanding and agreed to proceed.   I discussed the assessment and treatment plan with the patient. The patient was provided an opportunity to ask questions and all were answered. The patient agreed with the plan and demonstrated an understanding of the instructions.   The patient was advised to call back or seek an in-person evaluation if the symptoms worsen or if the condition fails to improve as anticipated.  I provided 35 minutes of non-face-to-face time during this encounter.   [place], LCSW   THERAPIST PROGRESS NOTE  Session Time: 2-235p  Participation Level: Active  Behavioral Response: NAAlertAnxious  Type of Therapy: Individual Therapy  Treatment Goals addressed: Problem: Decrease depressive symptoms and improve levels of effective functioning Goal: LTG: Reduce frequency, intensity, and duration of depression symptoms as evidenced by: SSB input needed on appropriate metric--pt self report Outcome: Not Progressing Note: Pt reports that she has been extremely emotional, frustrated/irritable Goal: STG: '@PREFFIRSTNAME'$ @ will participate in at least 80% of scheduled individual psychotherapy sessions Outcome: Progressing Intervention: REVIEW PLEASE SKILLS (TREAT PHYSICAL ILLNESS, BALANCE EATING, AVOID MOOD-ALTERING SUBSTANCES, BALANCE SLEEP AND GET EXERCISE) WITH [place] Note:  reviewed Intervention: Encourage compliance with prescribed medication regimen Note: Encouraged. Pt commits to re-start medication at this time. Intervention: Encourage verbalization of feelings/concerns/expectations Note: Explored/allowed pt to express  Interventions: Solution Focused   Summary: Shari [name] is a 26 y.o. female who presents with continuing symptoms consistent with depression and mild anxiety. Reviewed coping skills that have been helpful since last session. Pt admits that she has been noncompliant with medication since last session.  Pt denies SI but admits some HI with plan to "build a bomb and blow up my ex boyfriend". Pt states that the idea was fleeting--no true plan or intent to follow through.   Allowed pt to explore and express thoughts and feelings associated with recent life situations and external stressors. Pt reports that she broke up with boyfriend and that she also left a job that she didn't like. Pt states that she is currently working at a new job and its her brother's girlfriend that is her Clinical research associate. Pt reports that this is a trigger because "she and my brother are such a cute couple". It reminds pt of "what I want" in a relationship.  Allowed pt to explore her thoughts and expectations about relationships. Discussed relationship with ex girlfriend "but I'm not really gay. She wants me back but I'm not about that".  Encouraged pt to take some time for herself before jumping into another relationship to heal and do self reflection. Pt agrees.   Discussed situations that have triggered anger--pt made comment that she has gotten triggered by road rage. Pt reports that she feels that she lives in a "robotic world" and that some cars on the road were robots and not driven by people. When questioned, pt admits that she has not been compliant with medication--discussed how medication compliance will  be a treatment goal priority. Pt agrees and commits at time of session.    Continued recommendations are as follows: self care behaviors, positive social engagements, focusing on overall work/home/life balance, and focusing on positive physical and emotional wellness.   Suicidal/Homicidal: No. Passive HI towards ex boyfriend.  Therapist Response: Pt is continuing to apply interventions learned in session into daily life situations. Pt is currently on track to meet goals utilizing interventions mentioned above. Personal growth and progress intermittent/fluctuating due to external stressors and medication noncompliance currently.Treatment to continue as indicated.   Plan: Return again in 4 weeks.  Diagnosis:  Encounter Diagnoses  Name Primary?   MDD (major depressive disorder), recurrent, in full remission ([place]) Yes   GAD (generalized anxiety disorder)    Borderline personality disorder ([place])     Collaboration of Care: Other Pt encouraged to continue psychiatric follow ups with psychiatrist--Dr. Shea Evans  Patient/Guardian was advised Release of Information must be obtained prior to any record release in order to collaborate their care with an outside provider. Patient/Guardian was advised if they have not already done so to contact the registration department to sign all necessary forms in order for [place] to release information regarding their care.   Consent: Patient/Guardian gives verbal consent for treatment and assignment of benefits for services provided during this visit. Patient/Guardian expressed understanding and agreed to proceed.        [place], LCSW 06/19/2021

## 2021-06-19 NOTE — Plan of Care (Signed)
  Problem: Decrease depressive symptoms and improve levels of effective functioning Goal: LTG: Reduce frequency, intensity, and duration of depression symptoms as evidenced by: SSB input needed on appropriate metric--pt self report Outcome: Not Progressing Note: Pt reports that she has been extremely emotional, frustrated/irritable Goal: STG: '@PREFFIRSTNAME'$ @ will participate in at least 80% of scheduled individual psychotherapy sessions Outcome: Progressing Intervention: REVIEW PLEASE SKILLS (TREAT PHYSICAL ILLNESS, BALANCE EATING, AVOID MOOD-ALTERING SUBSTANCES, BALANCE SLEEP AND GET EXERCISE) WITH Shari [name] Note: reviewed Intervention: Encourage compliance with prescribed medication regimen Note: Encouraged. Pt commits to re-start medication at this time. Intervention: Encourage verbalization of feelings/concerns/expectations Note: Explored/allowed pt to express

## 2021-07-11 ENCOUNTER — Ambulatory Visit: Payer: BC Managed Care – PPO | Admitting: Psychiatry

## 2021-07-13 ENCOUNTER — Ambulatory Visit: Payer: BC Managed Care – PPO | Admitting: Psychiatry

## 2021-08-02 ENCOUNTER — Ambulatory Visit (HOSPITAL_COMMUNITY): Payer: BC Managed Care – PPO | Admitting: Licensed Clinical Social Worker

## 2021-08-02 NOTE — Progress Notes (Signed)
Pt reports that she is currently sick with COVID. Rescheduled pt appointment to 7/17'@8am'$  virtual

## 2021-08-13 ENCOUNTER — Ambulatory Visit (INDEPENDENT_AMBULATORY_CARE_PROVIDER_SITE_OTHER): Payer: Self-pay | Admitting: Licensed Clinical Social Worker

## 2021-08-13 ENCOUNTER — Encounter: Payer: Self-pay | Admitting: Licensed Clinical Social Worker

## 2021-08-13 DIAGNOSIS — F3342 Major depressive disorder, recurrent, in full remission: Secondary | ICD-10-CM

## 2021-08-13 DIAGNOSIS — F411 Generalized anxiety disorder: Secondary | ICD-10-CM

## 2021-08-13 NOTE — Progress Notes (Signed)
Virtual Visit via Audio Note  I connected with Shari [name] on 08/13/21 at 11:00 AM EDT by an a audio enabled telemedicine application and verified that I am speaking with the correct person using two identifiers.  Video connection was lost when less than 50% of the duration of the visit was complete, at which time the remainder of the visit was completed via audio only.  Location: Patient: home Provider: remote office [place], [place])   I discussed the limitations of evaluation and management by telemedicine and the availability of in person appointments. The patient expressed understanding and agreed to proceed.   I discussed the assessment and treatment plan with the patient. The patient was provided an opportunity to ask questions and all were answered. The patient agreed with the plan and demonstrated an understanding of the instructions.   The patient was advised to call back or seek an in-person evaluation if the symptoms worsen or if the condition fails to improve as anticipated.  I provided 30 minutes of non-face-to-face time during this encounter.   Shari [name] R Kapil Petropoulos, LCSW   THERAPIST PROGRESS NOTE  Session Time: 11-1130a  Participation Level: Active  Behavioral Response: NAAlertAnxious  Type of Therapy: Individual Therapy  Treatment Goals addressed: Problem: Decrease depressive symptoms and improve levels of effective functioning Goal: LTG: Reduce frequency, intensity, and duration of depression symptoms as evidenced by: SSB input needed on appropriate metric--pt self report Outcome: Not Progressing Note: Pt reports that she has been extremely emotional, frustrated/irritable Goal: STG: '@PREFFIRSTNAME'$ @ will participate in at least 80% of scheduled individual psychotherapy sessions Outcome: Progressing Intervention: REVIEW PLEASE SKILLS (TREAT PHYSICAL ILLNESS, BALANCE EATING, AVOID MOOD-ALTERING SUBSTANCES, BALANCE SLEEP AND GET EXERCISE) WITH [place] Note:  reviewed Intervention: Encourage compliance with prescribed medication regimen Note: Encouraged. Pt commits to re-start medication at this time. Intervention: Encourage verbalization of feelings/concerns/expectations Note: Explored/allowed pt to express  Interventions: Solution Focused   Summary: Shari [name] is a 26 y.o. female who presents with continuing symptoms consistent with depression and mild anxiety. Reviewed coping skills that have been helpful since last session.   Allowed pt to explore and express thoughts and feelings associated with recent life situations and external stressors.  Shari [name] reports that she has some anxiety and stress associated with starting a new job. Patient reports that she is very thankful that she has a new job, but is stressed that she currently does not have insurance. Patient reports that she does not think she has any insurance coverage to continue with mental health care. Provided patient with local resources that have additional funding for uninsured. Patient reflects understanding.  Reviewed coping mechanisms for managing depression, and managing stress. Allowed patient to explore and identify which coping skills work best for her period encourage patient to continue using these coping skills throughout the time that she is not able to check in with behavioral health providers.   Continued recommendations are as follows: self care behaviors, positive social engagements, focusing on overall work/home/life balance, and focusing on positive physical and emotional wellness.   Suicidal/Homicidal: No.  Therapist Response: Pt is continuing to apply interventions learned in session into daily life situations. Pt is currently on track to meet goals utilizing interventions mentioned above. Personal growth and progress intermittent/fluctuating due to external stressors and medication noncompliance currently.Treatment to continue as indicated.   Plan: Return again  PRN--pt states that she lost her insurance coverage due to starting a new job. Referred pt to local counseling resources if pt needs help/assistance. Referred [facility].  Pt reports she may become eligible for resources in 90 days.   Diagnosis:  Encounter Diagnoses  Name Primary?   MDD (major depressive disorder), recurrent, in full remission ([place]) Yes   GAD (generalized anxiety disorder)     Collaboration of Care: Other Pt encouraged to continue psychiatric follow ups with psychiatrist--Dr. Shea Evans  Patient/Guardian was advised Release of Information must be obtained prior to any record release in order to collaborate their care with an outside provider. Patient/Guardian was advised if they have not already done so to contact the registration department to sign all necessary forms in order for [place] to release information regarding their care.   Consent: Patient/Guardian gives verbal consent for treatment and assignment of benefits for services provided during this visit. Patient/Guardian expressed understanding and agreed to proceed.      [place], LCSW 08/13/2021

## 2021-08-23 ENCOUNTER — Encounter: Payer: Self-pay | Admitting: Advanced Practice Midwife

## 2021-08-23 ENCOUNTER — Ambulatory Visit: Payer: Self-pay

## 2021-08-23 ENCOUNTER — Ambulatory Visit (LOCAL_COMMUNITY_HEALTH_CENTER): Payer: Self-pay | Admitting: Advanced Practice Midwife

## 2021-08-23 VITALS — BP 139/86 | Ht 68.0 in | Wt 274.4 lb

## 2021-08-23 DIAGNOSIS — T7431XA Adult psychological abuse, confirmed, initial encounter: Secondary | ICD-10-CM | POA: Insufficient documentation

## 2021-08-23 DIAGNOSIS — E1165 Type 2 diabetes mellitus with hyperglycemia: Secondary | ICD-10-CM

## 2021-08-23 DIAGNOSIS — T7431XS Adult psychological abuse, confirmed, sequela: Secondary | ICD-10-CM

## 2021-08-23 DIAGNOSIS — Z3009 Encounter for other general counseling and advice on contraception: Secondary | ICD-10-CM

## 2021-08-23 DIAGNOSIS — Z3046 Encounter for surveillance of implantable subdermal contraceptive: Secondary | ICD-10-CM

## 2021-08-23 DIAGNOSIS — Z01419 Encounter for gynecological examination (general) (routine) without abnormal findings: Secondary | ICD-10-CM
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
## 2021-08-23 NOTE — Progress Notes (Signed)
Pt here for PE and Nexplanon removal.  FP packet and [facility] application provided to pt. Windle Guard, RN

## 2021-12-27 ENCOUNTER — Other Ambulatory Visit: Payer: Self-pay

## 2021-12-27 ENCOUNTER — Encounter: Payer: Self-pay | Admitting: Emergency Medicine

## 2021-12-27 ENCOUNTER — Emergency Department
Admission: EM | Admit: 2021-12-27 | Discharge: 2021-12-27 | Disposition: A | Discharge status: Home / Self Care | Payer: Self-pay | Attending: Emergency Medicine | Admitting: Emergency Medicine

## 2021-12-27 DIAGNOSIS — J45909 Unspecified asthma, uncomplicated: Secondary | ICD-10-CM | POA: Insufficient documentation

## 2021-12-27 DIAGNOSIS — Z20822 Contact with and (suspected) exposure to covid-19: Secondary | ICD-10-CM | POA: Insufficient documentation

## 2021-12-27 DIAGNOSIS — E119 Type 2 diabetes mellitus without complications: Secondary | ICD-10-CM | POA: Insufficient documentation

## 2021-12-27 DIAGNOSIS — B349 Viral infection, unspecified: Secondary | ICD-10-CM | POA: Insufficient documentation

## 2021-12-27 LAB — RESP PANEL BY RT-PCR (FLU A&B, COVID) ARPGX2
Influenza A by PCR: NEGATIVE
Influenza B by PCR: NEGATIVE
SARS Coronavirus 2 by RT PCR: NEGATIVE

## 2021-12-27 LAB — GROUP A STREP BY PCR: Group A Strep by PCR: NOT DETECTED

## 2021-12-27 MED ORDER — ACETAMINOPHEN 500 MG PO TABS
1000.0000 mg | ORAL_TABLET | Freq: Once | ORAL | Status: AC
  Administered 2021-12-27: 1000 mg via ORAL
  Filled 2021-12-27: qty 2

## 2021-12-27 MED ORDER — IBUPROFEN 400 MG PO TABS
400.0000 mg | ORAL_TABLET | Freq: Once | ORAL | Status: AC
  Administered 2021-12-27: 400 mg via ORAL
  Filled 2021-12-27: qty 1
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
## 2021-12-27 NOTE — Discharge Instructions (Addendum)
Your COVID and influenza test were negative.  You can take Tylenol Motrin for headache and bodyaches.

## 2021-12-27 NOTE — ED Triage Notes (Signed)
Patient ambulatory to triage with steady gait, without difficulty or distress noted; pt reports body aches, sore throat and congestion since yesterday

## 2022-03-16 ENCOUNTER — Ambulatory Visit
Admission: EM | Admit: 2022-03-16 | Discharge: 2022-03-16 | Disposition: A | Discharge status: Home / Self Care | Payer: Medicaid Other | Attending: Physician Assistant | Admitting: Physician Assistant

## 2022-03-16 ENCOUNTER — Encounter: Payer: Self-pay | Admitting: Emergency Medicine

## 2022-03-16 DIAGNOSIS — J101 Influenza due to other identified influenza virus with other respiratory manifestations: Secondary | ICD-10-CM | POA: Diagnosis not present

## 2022-03-16 DIAGNOSIS — J45901 Unspecified asthma with (acute) exacerbation: Secondary | ICD-10-CM | POA: Diagnosis not present

## 2022-03-16 DIAGNOSIS — Z1152 Encounter for screening for COVID-19: Secondary | ICD-10-CM | POA: Diagnosis not present

## 2022-03-16 LAB — RESP PANEL BY RT-PCR (RSV, FLU A&B, COVID)  RVPGX2
Influenza A by PCR: POSITIVE — AB
Influenza B by PCR: NEGATIVE
Resp Syncytial Virus by PCR: NEGATIVE
SARS Coronavirus 2 by RT PCR: NEGATIVE

## 2022-03-16 MED ORDER — PREDNISONE 20 MG PO TABS: 40.0000 mg | ORAL_TABLET | Freq: Every day | ORAL | 0 refills | Status: DC

## 2022-03-16 MED ORDER — OSELTAMIVIR PHOSPHATE 75 MG PO CAPS: 75.0000 mg | ORAL_CAPSULE | Freq: Two times a day (BID) | ORAL | 0 refills | Status: DC

## 2022-03-16 MED ORDER — DEXAMETHASONE SODIUM PHOSPHATE 10 MG/ML IJ SOLN
10.0000 mg | Freq: Once | INTRAMUSCULAR | Status: AC
  Administered 2022-03-16: 10 mg via INTRAMUSCULAR

## 2022-03-16 MED ORDER — IPRATROPIUM-ALBUTEROL 0.5-2.5 (3) MG/3ML IN SOLN: 3.0000 mL | Freq: Four times a day (QID) | RESPIRATORY_TRACT | 0 refills | Status: DC | PRN

## 2022-03-16 MED ORDER — IPRATROPIUM-ALBUTEROL 0.5-2.5 (3) MG/3ML IN SOLN
3.0000 mL | Freq: Once | RESPIRATORY_TRACT | Status: AC
  Administered 2022-03-16: 3 mL via RESPIRATORY_TRACT
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
## 2022-03-16 NOTE — ED Triage Notes (Signed)
Patient c/o cough, chest congestion, nasal congestion, bodyaches and chills that started yesterday.  Patient unsure of fevers.  Patient has history of asthma.

## 2022-03-16 NOTE — Discharge Instructions (Addendum)
Positive for influenza A which is a virus and must improve with time, virus most likely flared your asthma  Begin Tamiflu every morning and every evening for 5 days to reduce the amount of virus in your body which will help to minimize your symptoms, does not fully take away illness  You may use DuoNeb nebulizer solution every 6 hours as needed for shortness of breath or wheezing  Starting tomorrow begin prednisone every morning with food for 5 days to help relax the airway  You have been given a nebulizer treatment and an injection of steroids here today in our office to help relax your airway to calm your breathing    You can take Tylenol and/or Ibuprofen as needed for fever reduction and pain relief.   For cough: honey 1/2 to 1 teaspoon (you can dilute the honey in water or another fluid).  You can also use guaifenesin and dextromethorphan for cough. You can use a humidifier for chest congestion and cough.  If you don't have a humidifier, you can sit in the bathroom with the hot shower running.      For sore throat: try warm salt water gargles, cepacol lozenges, throat spray, warm tea or water with lemon/honey, popsicles or ice, or OTC cold relief medicine for throat discomfort.   For congestion: take a daily anti-histamine like Zyrtec, Claritin, and a oral decongestant, such as pseudoephedrine.  You can also use Flonase 1-2 sprays in each nostril daily.   It is important to stay hydrated: drink plenty of fluids (water, gatorade/powerade/pedialyte, juices, or teas) to keep your throat moisturized and help further relieve irritation/discomfort.

## 2022-05-29 ENCOUNTER — Inpatient Hospital Stay
Admission: RE | Admit: 2022-05-29 | Discharge: 2022-05-29 | Disposition: A | Discharge status: Home / Self Care | Payer: Medicaid Other | Source: Ambulatory Visit | Attending: Emergency Medicine | Admitting: Emergency Medicine

## 2022-05-29 ENCOUNTER — Ambulatory Visit
Admission: EM | Admit: 2022-05-29 | Discharge: 2022-05-29 | Disposition: A | Discharge status: Home / Self Care | Payer: Medicaid Other | Attending: Urgent Care | Admitting: Urgent Care

## 2022-05-29 VITALS — BP 129/79 | HR 93 | Temp 98.1°F | Resp 16

## 2022-05-29 DIAGNOSIS — R300 Dysuria: Secondary | ICD-10-CM

## 2022-05-29 LAB — POCT URINALYSIS DIP (MANUAL ENTRY)
Bilirubin, UA: NEGATIVE
Blood, UA: NEGATIVE
Glucose, UA: NEGATIVE mg/dL
Leukocytes, UA: NEGATIVE
Nitrite, UA: NEGATIVE
Spec Grav, UA: >=1.03 — AB (ref 1.010–1.025)
Urobilinogen, UA: 0.2 E.U./dL
pH, UA: 6 (ref 5.0–8.0)
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
## 2022-05-29 NOTE — Discharge Instructions (Signed)
Follow up here or with your primary care provider if your symptoms are worsening or not improving.     

## 2022-05-29 NOTE — ED Triage Notes (Signed)
Pt states she is having lower back pain with lower abdominal cramping, with painful urination and clear discharge that started 5 days ago.

## 2022-05-30 LAB — CERVICOVAGINAL ANCILLARY ONLY
Bacterial Vaginitis (gardnerella): NEGATIVE
Candida Glabrata: NEGATIVE
Candida Vaginitis: NEGATIVE
Comment: NEGATIVE
Comment: NEGATIVE
Comment: NEGATIVE

## 2022-07-22 ENCOUNTER — Ambulatory Visit
Admission: EM | Admit: 2022-07-22 | Discharge: 2022-07-22 | Disposition: A | Discharge status: Home / Self Care | Payer: 59 | Attending: Emergency Medicine | Admitting: Emergency Medicine

## 2022-07-22 DIAGNOSIS — Z3202 Encounter for pregnancy test, result negative: Secondary | ICD-10-CM | POA: Insufficient documentation

## 2022-07-22 DIAGNOSIS — Z113 Encounter for screening for infections with a predominantly sexual mode of transmission: Secondary | ICD-10-CM | POA: Insufficient documentation

## 2022-07-22 DIAGNOSIS — N898 Other specified noninflammatory disorders of vagina: Secondary | ICD-10-CM | POA: Diagnosis not present

## 2022-07-22 LAB — POCT URINALYSIS DIP (MANUAL ENTRY)
Bilirubin, UA: NEGATIVE
Blood, UA: NEGATIVE
Glucose, UA: NEGATIVE mg/dL
Leukocytes, UA: NEGATIVE
Nitrite, UA: NEGATIVE
Spec Grav, UA: >=1.03 — AB (ref 1.010–1.025)
Urobilinogen, UA: 0.2 E.U./dL
pH, UA: 5.5 (ref 5.0–8.0)

## 2022-07-22 LAB — POCT URINE PREGNANCY: Preg Test, Ur: NEGATIVE
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
## 2022-07-22 NOTE — Discharge Instructions (Addendum)
Your tests are pending.  If your test results are positive, we will call you.  You and your sexual partner(s) may require treatment at that time.  Do not have sexual activity for at least 7 days.    Follow up with your primary care provider if your symptoms are not improving.    

## 2022-07-22 NOTE — ED Triage Notes (Signed)
Patient to Urgent Care with complaints of increased, clear vaginal discharge that started today. Reports new sexual partner. Finished period last week.   Recent yeast infection- treated with monistat.

## 2022-07-23 LAB — CERVICOVAGINAL ANCILLARY ONLY
Bacterial Vaginitis (gardnerella): NEGATIVE
Candida Glabrata: NEGATIVE
Candida Vaginitis: POSITIVE — AB
Chlamydia: NEGATIVE
Comment: NEGATIVE
Comment: NEGATIVE
Comment: NEGATIVE
Comment: NEGATIVE
Comment: NEGATIVE
Comment: NORMAL
Neisseria Gonorrhea: NEGATIVE
Trichomonas: NEGATIVE

## 2022-07-24 ENCOUNTER — Telehealth (HOSPITAL_COMMUNITY): Payer: Self-pay | Admitting: Emergency Medicine

## 2022-07-24 LAB — RPR: RPR Ser Ql: NONREACTIVE

## 2022-07-24 LAB — HIV ANTIBODY (ROUTINE TESTING W REFLEX): HIV Screen 4th Generation wRfx: NONREACTIVE

## 2022-07-24 MED ORDER — FLUCONAZOLE 150 MG PO TABS: 150.0000 mg | ORAL_TABLET | Freq: Once | ORAL | 0 refills | Status: AC

## 2022-07-30 ENCOUNTER — Ambulatory Visit: Payer: 59

## 2022-10-24 ENCOUNTER — Encounter: Payer: Self-pay | Admitting: Advanced Practice Midwife

## 2022-10-24 ENCOUNTER — Ambulatory Visit: Payer: 59 | Admitting: Advanced Practice Midwife

## 2022-10-24 ENCOUNTER — Other Ambulatory Visit: Payer: Self-pay

## 2022-10-24 DIAGNOSIS — Z113 Encounter for screening for infections with a predominantly sexual mode of transmission: Secondary | ICD-10-CM

## 2022-10-24 DIAGNOSIS — B379 Candidiasis, unspecified: Secondary | ICD-10-CM

## 2022-10-24 DIAGNOSIS — L732 Hidradenitis suppurativa: Secondary | ICD-10-CM

## 2022-10-24 LAB — HM HIV SCREENING LAB: HM HIV Screening: NEGATIVE

## 2022-10-24 LAB — HM HEPATITIS C SCREENING LAB: HM Hepatitis Screen: NEGATIVE

## 2022-10-24 LAB — HEPATITIS B SURFACE ANTIGEN

## 2022-10-24 LAB — WET PREP FOR TRICH, YEAST, CLUE: Trichomonas Exam: NEGATIVE

## 2022-10-24 MED ORDER — CLOTRIMAZOLE 1 % VA CREA: 1.0000 | TOPICAL_CREAM | Freq: Every day | VAGINAL | Status: AC
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
## 2022-10-24 NOTE — Progress Notes (Signed)
Pt here for std screening.  Wet prep results reviewed, treated per standing order.  The patient was dispensed clotrimazole today. I provided counseling today regarding the medication. We discussed the medication, the side effects and when to call clinic. Patient given the opportunity to ask questions. Questions answered.  Condoms declined. Gaspar Garbe, RN

## 2022-10-30 LAB — GONOCOCCUS CULTURE

## 2022-12-09 ENCOUNTER — Emergency Department
Admission: EM | Admit: 2022-12-09 | Discharge: 2022-12-09 | Disposition: A | Discharge status: Home / Self Care | Payer: 59 | Attending: Emergency Medicine | Admitting: Emergency Medicine

## 2022-12-09 DIAGNOSIS — N3001 Acute cystitis with hematuria: Secondary | ICD-10-CM | POA: Diagnosis not present

## 2022-12-09 DIAGNOSIS — R1110 Vomiting, unspecified: Secondary | ICD-10-CM | POA: Diagnosis present

## 2022-12-09 DIAGNOSIS — R1030 Lower abdominal pain, unspecified: Secondary | ICD-10-CM

## 2022-12-09 LAB — URINALYSIS, ROUTINE W REFLEX MICROSCOPIC
Bilirubin Urine: NEGATIVE
Glucose, UA: NEGATIVE mg/dL
Ketones, ur: NEGATIVE mg/dL
Nitrite: NEGATIVE
Protein, ur: 100 mg/dL — AB
RBC / HPF: >50 RBC/hpf (ref 0–5)
Specific Gravity, Urine: 1.03 (ref 1.005–1.030)
WBC, UA: >50 WBC/hpf (ref 0–5)
pH: 5 (ref 5.0–8.0)

## 2022-12-09 LAB — CBC
HCT: 35 % — ABNORMAL LOW (ref 36.0–46.0)
Hemoglobin: 11.3 g/dL — ABNORMAL LOW (ref 12.0–15.0)
MCH: 27.5 pg (ref 26.0–34.0)
MCHC: 32.3 g/dL (ref 30.0–36.0)
MCV: 85.2 fL (ref 80.0–100.0)
Platelets: 392 10*3/uL (ref 150–400)
RBC: 4.11 MIL/uL (ref 3.87–5.11)
RDW: 14.3 % (ref 11.5–15.5)
WBC: 9 10*3/uL (ref 4.0–10.5)
nRBC: 0 % (ref 0.0–0.2)

## 2022-12-09 LAB — COMPREHENSIVE METABOLIC PANEL
ALT: 12 U/L (ref 0–44)
AST: 12 U/L — ABNORMAL LOW (ref 15–41)
Albumin: 3.6 g/dL (ref 3.5–5.0)
Alkaline Phosphatase: 73 U/L (ref 38–126)
Anion gap: 8 (ref 5–15)
BUN: 13 mg/dL (ref 6–20)
CO2: 23 mmol/L (ref 22–32)
Calcium: 8.3 mg/dL — ABNORMAL LOW (ref 8.9–10.3)
Chloride: 106 mmol/L (ref 98–111)
Creatinine, Ser: 0.72 mg/dL (ref 0.44–1.00)
GFR, Estimated: >60 mL/min (ref >60)
Glucose, Bld: 123 mg/dL — ABNORMAL HIGH (ref 70–99)
Potassium: 3.4 mmol/L — ABNORMAL LOW (ref 3.5–5.1)
Sodium: 137 mmol/L (ref 135–145)
Total Bilirubin: 0.4 mg/dL (ref <1.2)
Total Protein: 8.2 g/dL — ABNORMAL HIGH (ref 6.5–8.1)

## 2022-12-09 LAB — POC URINE PREG, ED: Preg Test, Ur: NEGATIVE

## 2022-12-09 MED ORDER — CEPHALEXIN 500 MG PO CAPS: 500.0000 mg | ORAL_CAPSULE | Freq: Four times a day (QID) | ORAL | 0 refills | Status: AC

## 2022-12-09 NOTE — ED Provider Notes (Signed)
[facility] Provider Note    Event Date/Time   First MD Initiated Contact with Patient 12/09/22 [phone]     (approximate)   History   Abdominal Pain   HPI Shari [name] is a 27 y.o. female who presents for evaluation of abdominal pain and 1 episode of emesis.  She reports that she is just started her menstrual cycle.  She has been having lighter than normal vaginal bleeding but states that tonight all of a sudden she had a really bad pain in her lower abdomen in the middle (gestures to suprapubic region) and it scared her because it hurts so much.  She states that she called 911 and ran outside and then threw up once in the ER.  Now she feels better.  She has a little bit of persistent pain but nothing like before.  No persistent emesis.  She was fine before this happened.  She denies any recent increase in urinary frequency and denies pain when she urinates.     Physical Exam   Triage Vital Signs: ED Triage Vitals  Encounter Vitals Group     BP 12/09/22 0113 132/60     Systolic BP Percentile --      Diastolic BP Percentile --      Pulse Rate 12/09/22 0113 88     Resp 12/09/22 0113 18     Temp 12/09/22 0113 99.5 F (37.5 C)     Temp Source 12/09/22 0113 Oral     SpO2 12/09/22 0113 99 %     Weight 12/09/22 0116 122.5 kg (270 lb)     Height 12/09/22 0116 1.727 m (5\' 8" )     Head Circumference --      Peak Flow --      Pain Score 12/09/22 0114 9     Pain Loc --      Pain Education --      Exclude from Growth Chart --     Most recent vital signs: Vitals:   12/09/22 0113  BP: 132/60  Pulse: 88  Resp: 18  Temp: 99.5 F (37.5 C)  SpO2: 99%    General: Awake, no distress.  Generally well-appearing. CV:  Good peripheral perfusion.  Regular rate and rhythm. Resp:  Normal effort. Speaking easily and comfortably, no accessory muscle usage nor intercostal retractions.   Abd:  No distention.  Obese.  No tenderness to palpation of the abdomen and no  guarding.   ED Results / Procedures / Treatments   Labs (all labs ordered are listed, but only abnormal results are displayed) Labs Reviewed  COMPREHENSIVE METABOLIC PANEL - Abnormal; Notable for the following components:      Result Value   Potassium 3.4 (*)    Glucose, Bld 123 (*)    Calcium 8.3 (*)    Total Protein 8.2 (*)    AST 12 (*)    All other components within normal limits  CBC - Abnormal; Notable for the following components:   Hemoglobin 11.3 (*)    HCT 35.0 (*)    All other components within normal limits  URINALYSIS, ROUTINE W REFLEX MICROSCOPIC - Abnormal; Notable for the following components:   Color, Urine YELLOW (*)    APPearance CLOUDY (*)    Hgb urine dipstick LARGE (*)    Protein, ur 100 (*)    Leukocytes,Ua MODERATE (*)    Bacteria, UA MANY (*)    All other components within normal limits  URINE CULTURE  POC URINE  PREG, ED      RADIOLOGY None   PROCEDURES:  Critical Care performed: No  Procedures    IMPRESSION / MDM / ASSESSMENT AND PLAN / ED COURSE  I reviewed the triage vital signs and the nursing notes.                              Differential diagnosis includes, but is not limited to, menstrual cramping, nonspecific gastritis or colitis, ovarian cyst and/or torsion, STD/PID, UTI/pyelonephritis.  Patient's presentation is most consistent with acute presentation with potential threat to life or bodily function.  Labs/studies ordered: Urine pregnancy test, urine culture, CMP, urinalysis, CBC  Interventions/Medications given:  Medications - No data to display  (Note:  hospital course my include additional interventions and/or labs/studies not listed above.)   Vital signs stable and reassuring, physical exam is also reassuring and the patient has no persistent abdominal pain or nausea and she has no tenderness to palpation.  Highly doubt acute/emergent intra-abdominal pathology.  Her workup is all essentially normal except for a  grossly positive urinalysis.  I explained to the patient that she likely has a UTI which could have led to some of the pain she was experiencing in the suprapubic region.  She declines any medications tonight including a first dose of antibiotics but recognizes she needs to pick up her prescription.  She declined intramuscular injection of antibiotics as well.  She will follow-up as an outpatient and I gave my usual and customary return precautions.  I considered imaging but given her benign abdominal exam I do not think she would benefit from any imaging at this time.  Stable and appropriate for discharge.       FINAL CLINICAL IMPRESSION(S) / ED DIAGNOSES   Final diagnoses:  Lower abdominal pain  Acute cystitis with hematuria     Rx / DC Orders   ED Discharge Orders          Ordered    cephALEXin (KEFLEX) 500 MG capsule  4 times daily        12/09/22 0344             Note:  This document was prepared using Dragon voice recognition software and may include unintentional dictation errors.   Loleta Rose, MD 12/09/22 [phone]

## 2022-12-09 NOTE — ED Notes (Addendum)
BIB [facility] from home for abd pain. Pt started her period yesterday. Pt states "it is a light flow and has vomited once" pregnancy is  a possibility. Pt has not taken any OTC meds for pain. Pt is caox4, in no acute distress and ambulatory in triage.   127BGL 88HR 99% RA 119/62

## 2022-12-09 NOTE — ED Triage Notes (Signed)
Pt BIB [facility] for abd pain/cramping, pt started her menstrual yesterday. Pt denies heavy vaginal bleeding, reports "lighter then normal". Concern for possible pregnancy, sexually active w/o any sort of prevenative methods. Vomited x1 PTA. Denies diarrhea. A&O x4, NAD Noted, VSS.

## 2022-12-09 NOTE — Discharge Instructions (Signed)
We believe that your abdominal pain is most likely combination of your menstrual cycle as well as the urinary tract infection we found on your evaluation.  Please take the full course of antibiotics as prescribed.  Follow-up with your regular provider at the next available opportunity and return to the emergency department if you develop new or worsening symptoms that concern you.

## 2022-12-10 LAB — URINE CULTURE

## 2022-12-24 ENCOUNTER — Other Ambulatory Visit: Payer: Self-pay

## 2022-12-24 ENCOUNTER — Emergency Department (EMERGENCY_DEPARTMENT_HOSPITAL)
Admission: EM | Admit: 2022-12-24 | Discharge: 2022-12-25 | Disposition: A | Discharge status: Other Acute Inpatient Hospital | Payer: 59 | Source: Home / Self Care | Attending: Emergency Medicine | Admitting: Emergency Medicine

## 2022-12-24 DIAGNOSIS — J45909 Unspecified asthma, uncomplicated: Secondary | ICD-10-CM | POA: Insufficient documentation

## 2022-12-24 DIAGNOSIS — F32A Depression, unspecified: Secondary | ICD-10-CM | POA: Insufficient documentation

## 2022-12-24 DIAGNOSIS — F29 Unspecified psychosis not due to a substance or known physiological condition: Secondary | ICD-10-CM | POA: Insufficient documentation

## 2022-12-24 DIAGNOSIS — F603 Borderline personality disorder: Secondary | ICD-10-CM | POA: Insufficient documentation

## 2022-12-24 DIAGNOSIS — R4589 Other symptoms and signs involving emotional state: Secondary | ICD-10-CM | POA: Insufficient documentation

## 2022-12-24 DIAGNOSIS — R45851 Suicidal ideations: Secondary | ICD-10-CM

## 2022-12-24 DIAGNOSIS — Z79899 Other long term (current) drug therapy: Secondary | ICD-10-CM | POA: Insufficient documentation

## 2022-12-24 LAB — COMPREHENSIVE METABOLIC PANEL
ALT: 13 U/L (ref 0–44)
AST: 14 U/L — ABNORMAL LOW (ref 15–41)
Albumin: 3.6 g/dL (ref 3.5–5.0)
Alkaline Phosphatase: 75 U/L (ref 38–126)
Anion gap: 7 (ref 5–15)
BUN: 11 mg/dL (ref 6–20)
CO2: 23 mmol/L (ref 22–32)
Calcium: 8.2 mg/dL — ABNORMAL LOW (ref 8.9–10.3)
Chloride: 105 mmol/L (ref 98–111)
Creatinine, Ser: 0.62 mg/dL (ref 0.44–1.00)
GFR, Estimated: >60 mL/min (ref >60)
Glucose, Bld: 109 mg/dL — ABNORMAL HIGH (ref 70–99)
Potassium: 3.2 mmol/L — ABNORMAL LOW (ref 3.5–5.1)
Sodium: 135 mmol/L (ref 135–145)
Total Bilirubin: 0.4 mg/dL (ref <1.2)
Total Protein: 8.1 g/dL (ref 6.5–8.1)

## 2022-12-24 LAB — CBC
HCT: 33.8 % — ABNORMAL LOW (ref 36.0–46.0)
Hemoglobin: 11.1 g/dL — ABNORMAL LOW (ref 12.0–15.0)
MCH: 27.4 pg (ref 26.0–34.0)
MCHC: 32.8 g/dL (ref 30.0–36.0)
MCV: 83.5 fL (ref 80.0–100.0)
Platelets: 382 10*3/uL (ref 150–400)
RBC: 4.05 MIL/uL (ref 3.87–5.11)
RDW: 14.4 % (ref 11.5–15.5)
WBC: 8.8 10*3/uL (ref 4.0–10.5)
nRBC: 0 % (ref 0.0–0.2)

## 2022-12-24 MED ORDER — ALBUTEROL SULFATE HFA 108 (90 BASE) MCG/ACT IN AERS
1.0000 | INHALATION_SPRAY | RESPIRATORY_TRACT | Status: DC | PRN
Start: 2022-12-24 — End: 2022-12-25
  Filled 2022-12-24: qty 6.7

## 2022-12-24 NOTE — ED Notes (Addendum)
Pt belongings:  Black hat  Black sweatshirt White tshirt Black pj pants Black shoes [employer]

## 2022-12-24 NOTE — ED Triage Notes (Signed)
Pt to ED via [place] [employer], Under IVC. Per paperwork pt had rope tied to cinder block over bypass and was feeling suicidal. Pt currently denies SI/HI. Pt calm and cooperative in triage.

## 2022-12-24 NOTE — Consult Note (Signed)
[facility] Psych ED Progress Note  12/25/2022 6:37 AM Shari [name]  MRN:  [id number]   Method of visit?: Virtual (Location of provider: [facility] Location of patient: [facility] ED Names and roles of anyone participating in the consult/assessment: Bishop Limbo PMHNP This service was provided via telemedicine using a 2-way, interactive audio, and video technology. )  Subjective:  "It's been a rough day, I did something I shouldn't have, I'm fine now" Diagnosis:  Active Problems:   Suicidal behavior  Total Time spent with patient: 30 minutes  HPI: Shari [name] 27 y.o., female patient with past psychiatric history of schizophrenia, borderline personality disorder, cannabis use disorder, depression, anxiety, and at least one hospitalization in 2017 for auditory hallucinations/psychosis presented to [facility] ED under IVC accompanied by law enforcement.   Per triage note: Pt to ED via [place] sher[employer] dept, Under IVC. Per paperwork pt had rope tied to cinder block over bypass and was feeling suicidal.   During my evaluation Shari [name] shared that today was a rough day she had some personal things going on that she initially states she does not want to get into, she later states the personal things involved a break-up and on top of that her mood is normally down a couple of weeks prior to her period starting.  Though she is forthcoming with information and tells me that she got a cinderblock, tied a rope around the cinderblock, placed it in her vehicle and drove to a bypass overlooking [facility] and sat there, she is also minimizing today's events and states that she is fine now.  Patient declined to provide consent for staff to obtain collateral from a family member or friend.  During evaluation Shari [name] is seated and in no acute distress. She is alert, oriented x 4, calm, cooperative, and attentive. Her mood is anxious with flat affect.  She has normal speech, and behavior.  Objectively,  there is no evidence of psychosis, mania, or delusional thinking.  Patient is able to converse coherently, goal directed thoughts, no distractibility, or pre-occupation. She also denies suicidal/self-harm/homicidal ideation, psychosis, and paranoia. She denies ETOH, tobacco, or other substance use. Patient answered questions appropriately.    Discussed recommendation for inpatient psychiatric admission for stabilization and treatment. Discussed milieu and expectations.  At this time patient became agitated stating she does not want to stay here once moment more, she has already been here too long, she states she needs to go to work in the morning, and this place is inhumane.  Patient is not in agreement with the plan for inpatient psychiatric admission, stating she is fine now and does not need to be here. Patient states she will not participate in anything once admitted and she states she will not take any medication tonight.   Past Medical History:  Past Medical History:  Diagnosis Date   Anxiety    Asthma    well controlled   Diabetes mellitus without complication (HCC)    pt states she went off her meds because her numbers were fine   Endometriosis    Hidradenitis suppurativa 12/31 /2019   Pneumonia 01/2017   Refusal of blood product     Past Surgical History:  Procedure Laterality Date   ADENOIDECTOMY Bilateral 02/09/2018   Procedure: REVISION ADENOIDECTOMY;  Surgeon: Bud Face, MD;  Location: [facility] ORS;  Service: ENT;  Laterality: Bilateral;   EYE SURGERY     MYRINGOTOMY WITH TUBE PLACEMENT Right 02/09/2018   Procedure: MYRINGOTOMY WITH TUBE PLACEMENT;  Surgeon: Bud Face, MD;  Location: [facility] ORS;  Service: ENT;  Laterality: Right;   NASOPHARYNGOSCOPY EUSTATION TUBE BALLOON DILATION Bilateral 02/09/2018   Procedure: NASOPHARYNGOSCOPY EUSTATION TUBE BALLOON DILATION;  Surgeon: Bud Face, MD;  Location: [facility] ORS;  Service: ENT;  Laterality: Bilateral;    TONSILLECTOMY     TURBINATE REDUCTION Right 02/09/2018   Procedure: OUTFRACTURE RIGHT INFERIOR TURBINATE;  Surgeon: Bud Face, MD;  Location: [facility] ORS;  Service: ENT;  Laterality: Right;   Family History:  Family History  Problem Relation Age of Onset   Anxiety disorder Mother    Depression Mother    Alcohol abuse Father    Drug abuse Father    Schizophrenia Maternal Uncle    Social History:  Social History   Substance and Sexual Activity  Alcohol Use Yes   Alcohol/week: 2.0 standard drinks of alcohol   Types: 2 Glasses of wine per week     Social History   Substance and Sexual Activity  Drug Use Not Currently   Types: Marijuana   Comment: last use 08/19/21    Social History   Socioeconomic History   Marital status: Single    Spouse name: Not on file   Number of children: 0   Years of education: Not on file   Highest education level: Associate degree: occupational, Scientist, product/process development, or vocational program  Occupational History   Not on file  Tobacco Use   Smoking status: Never   Smokeless tobacco: Never  Vaping Use   Vaping status: Never Used  Substance and Sexual Activity   Alcohol use: Yes    Alcohol/week: 2.0 standard drinks of alcohol    Types: 2 Glasses of wine per week   Drug use: Not Currently    Types: Marijuana    Comment: last use 08/19/21   Sexual activity: Yes    Partners: Male    Birth control/protection: None  Other Topics Concern   Not on file  Social History Narrative   Not on file   Social Determinants of Health   Financial Resource Strain: Low Risk  (09/02/2022)   Received from [facility]   Overall Financial Resource Strain (CARDIA)    Difficulty of Paying Living Expenses: Not hard at all  Food Insecurity: No Food Insecurity (12/25/2022)   Hunger Vital Sign    Worried About Running Out of Food in the Last Year: Never true    Ran Out of Food in the Last Year: Never true  Transportation Needs: No Transportation Needs (12/25/2022)    PRAPARE - Administrator, Civil Service (Medical): No    Lack of Transportation (Non-Medical): No  Physical Activity: Unknown (02/19/2017)   Exercise Vital Sign    Days of Exercise per Week: 0 days    Minutes of Exercise per Session: Not asked  Stress: Stress Concern Present (02/19/2017)   [employer] of Occupational Health - Occupational Stress Questionnaire    Feeling of Stress : Rather much  Social Connections: Unknown (06/12/2021)   Received from [facility]   Social Network    Social Network: Not on file    Sleep: Good  Appetite:  Good  Current Medications: No current facility-administered medications for this encounter.   No current outpatient medications on file.   Facility-Administered Medications Ordered in Other Encounters  Medication Dose Route Frequency Provider Last Rate Last Admin   acetaminophen (TYLENOL) tablet 650 mg  650 mg Oral Q6H PRN Taziah Difatta, Lanney Gins, NP  alum & mag hydroxide-simeth (MAALOX/MYLANTA) 200-200-20 MG/5ML suspension 30 mL  30 mL Oral Q4H PRN Saretta Dahlem L, NP       hydrOXYzine (ATARAX) tablet 25 mg  25 mg Oral TID PRN Teagyn Fishel L, NP       magnesium hydroxide (MILK OF MAGNESIA) suspension 30 mL  30 mL Oral Daily PRN Sussan Meter L, NP       traZODone (DESYREL) tablet 50 mg  50 mg Oral QHS PRN Belen Pesch L, NP        Lab Results:  Results for orders placed or performed during the hospital encounter of 12/24/22 (from the past 48 hour(s))  Comprehensive metabolic panel     Status: Abnormal   Collection Time: 12/24/22 11:36 PM  Result Value Ref Range   Sodium 135 135 - 145 mmol/L   Potassium 3.2 (L) 3.5 - 5.1 mmol/L   Chloride 105 98 - 111 mmol/L   CO2 23 22 - 32 mmol/L   Glucose, Bld 109 (H) 70 - 99 mg/dL    Comment: Glucose reference range applies only to samples taken after fasting for at least 8 hours.   BUN 11 6 - 20 mg/dL   Creatinine, Ser 5.28 0.44 - 1.00 mg/dL   Calcium 8.2 (L) 8.9 - 10.3 mg/dL    Total Protein 8.1 6.5 - 8.1 g/dL   Albumin 3.6 3.5 - 5.0 g/dL   AST 14 (L) 15 - 41 U/L   ALT 13 0 - 44 U/L   Alkaline Phosphatase 75 38 - 126 U/L   Total Bilirubin 0.4 <1.2 mg/dL   GFR, Estimated >41 >32 mL/min    Comment: (NOTE) Calculated using the CKD-EPI Creatinine Equation (2021)    Anion gap 7 5 - 15    Comment: Performed at [facility], [address] [place], [place] [address]  Ethanol     Status: None   Collection Time: 12/24/22 11:36 PM  Result Value Ref Range   Alcohol, Ethyl (B) <10 <10 mg/dL    Comment: (NOTE) Lowest detectable limit for serum alcohol is 10 mg/dL.  For medical purposes only. Performed at [facility], [address] [place], [place] [address]   Salicylate level     Status: Abnormal   Collection Time: 12/24/22 11:36 PM  Result Value Ref Range   Salicylate Lvl <7.0 (L) 7.0 - 30.0 mg/dL    Comment: Performed at [facility], [address] [place], [place] [address]  Acetaminophen level     Status: Abnormal   Collection Time: 12/24/22 11:36 PM  Result Value Ref Range   Acetaminophen (Tylenol), Serum <10 (L) 10 - 30 ug/mL    Comment: (NOTE) Therapeutic concentrations vary significantly. A range of 10-30 ug/mL  may be an effective concentration for many patients. However, some  are best treated at concentrations outside of this range. Acetaminophen concentrations >150 ug/mL at 4 hours after ingestion  and >50 ug/mL at 12 hours after ingestion are often associated with  toxic reactions.  Performed at [facility], [address] [place], [place] [address]   cbc     Status: Abnormal   Collection Time: 12/24/22 11:36 PM  Result Value Ref Range   WBC 8.8 4.0 - 10.5 K/uL   RBC 4.05 3.87 - 5.11 MIL/uL   Hemoglobin 11.1 (L) 12.0 - 15.0 g/dL   HCT 59.5 (L) 63.8 - 75.6 %   MCV 83.5 80.0 - 100.0 fL   MCH 27.4 26.0 - 34.0  pg   MCHC 32.8 30.0 - 36.0 g/dL   RDW 01.0 27.2 - 53.6 %   Platelets 382 150 - 400  K/uL   nRBC 0.0 0.0 - 0.2 %    Comment: Performed at [facility], [address] [place], [place] [address]  Urine Drug Screen, Qualitative     Status: Abnormal   Collection Time: 12/24/22 11:54 PM  Result Value Ref Range   Tricyclic, Ur Screen NONE DETECTED NONE DETECTED   Amphetamines, Ur Screen NONE DETECTED NONE DETECTED   MDMA (Ecstasy)Ur Screen NONE DETECTED NONE DETECTED   Cocaine Metabolite,Ur [place] NONE DETECTED NONE DETECTED   Opiate, Ur Screen NONE DETECTED NONE DETECTED   Phencyclidine (PCP) Ur S NONE DETECTED NONE DETECTED   Cannabinoid 50 Ng, Ur [place] POSITIVE (A) NONE DETECTED   Barbiturates, Ur Screen NONE DETECTED NONE DETECTED   Benzodiazepine, Ur Scrn NONE DETECTED NONE DETECTED   Methadone Scn, Ur NONE DETECTED NONE DETECTED    Comment: (NOTE) Tricyclics + metabolites, urine    Cutoff 1000 ng/mL Amphetamines + metabolites, urine  Cutoff 1000 ng/mL MDMA (Ecstasy), urine              Cutoff 500 ng/mL Cocaine Metabolite, urine          Cutoff 300 ng/mL Opiate + metabolites, urine        Cutoff 300 ng/mL Phencyclidine (PCP), urine         Cutoff 25 ng/mL Cannabinoid, urine                 Cutoff 50 ng/mL Barbiturates + metabolites, urine  Cutoff 200 ng/mL Benzodiazepine, urine              Cutoff 200 ng/mL Methadone, urine                   Cutoff 300 ng/mL  The urine drug screen provides only a preliminary, unconfirmed analytical test result and should not be used for non-medical purposes. Clinical consideration and professional judgment should be applied to any positive drug screen result due to possible interfering substances. A more specific alternate chemical method must be used in order to obtain a confirmed analytical result. Gas chromatography / mass spectrometry (GC/MS) is the preferred confirm atory method. Performed at [facility], [address] [place], [place] [address]   Pregnancy, urine     Status: None   Collection Time:  12/24/22 11:54 PM  Result Value Ref Range   Preg Test, Ur NEGATIVE NEGATIVE    Comment:        THE SENSITIVITY OF THIS METHODOLOGY IS >25 mIU/mL. Performed at [facility], [address] [place], [place] [address]     Blood Alcohol level:  Lab Results  Component Value Date   [facility] <10 12/24/2022   ETH <5 12/06/2015    Musculoskeletal: Strength & Muscle Tone: within normal limits Gait & Station: normal Patient leans: N/A  Psychiatric Specialty Exam:  Presentation  General Appearance: Appropriate for Environment; Casual (Dressed in ED scrubs)  Eye Contact:Good  Speech:Clear and Coherent  Speech Volume:Normal  Handedness:Right   Mood and Affect  Mood:Depressed; Anxious  Affect:Congruent   Thought Process  Thought Processes:Coherent  Descriptions of Associations:Intact  Orientation:Full (Time, Place and Person)  Thought Content:WDL  History of Schizophrenia/Schizoaffective disorder:No  Duration of Psychotic Symptoms:No data recorded Hallucinations:Hallucinations: None  Ideas of Reference:None  Suicidal Thoughts:Suicidal Thoughts: No (Patient denies suicidal ideation, she was found by police sitting in her car  on a bridge over [facility] with a rope tied around a cinder block)  Homicidal Thoughts:Homicidal Thoughts: No   Sensorium  Memory:Immediate Good; Recent Good; Remote Good  Judgment:Poor  Insight:Poor   Executive Functions  Concentration:Good  Attention Span:Good  Recall:Good  Fund of Knowledge:Good  Language:Good   Psychomotor Activity  Psychomotor Activity:Psychomotor Activity: Normal   Assets  Assets:Communication Skills; Housing; Transportation   Sleep  Sleep:Sleep: Good Number of Hours of Sleep: 7    Physical Exam: Physical Exam Vitals and nursing note reviewed.  Constitutional:      General: She is not in acute distress.    Appearance: She is not ill-appearing.  HENT:     Head: Normocephalic.   Cardiovascular:     Rate and Rhythm: Normal rate.  Pulmonary:     Effort: Pulmonary effort is normal.  Neurological:     General: No focal deficit present.     Mental Status: She is alert and oriented to person, place, and time.  Psychiatric:        Attention and Perception: Attention normal.        Mood and Affect: Mood is anxious. Affect is angry.        Speech: Speech normal.        Behavior: Behavior is agitated.        Judgment: Judgment is impulsive and inappropriate.    Review of Systems  Constitutional:  Negative for fever and weight loss.  HENT:  Negative for congestion.   Eyes:  Negative for discharge.  Respiratory:  Negative for cough, shortness of breath and wheezing.   Cardiovascular:  Negative for palpitations.  Neurological:  Negative for focal weakness and weakness.  Psychiatric/Behavioral:  Negative for substance abuse. The patient does not have insomnia.    Blood pressure 136/83, pulse 85, temperature 98.8 F (37.1 C), temperature source Oral, resp. rate 18, height 5\' 8"  (1.727 m), weight 122.5 kg, last menstrual period 12/08/2022, SpO2 99%. Body mass index is 41.05 kg/m.  Treatment Plan Summary: Patient meets criteria for psychiatric inpatient treatment, patient poses a risk for safety to herself. Patient requires inpatient treatment for acute crisis management and psychiatric stabilization.  LCSW will fax out if no bed availability at [facility].   This visit was coded based on time. I spent a total of 40 minutes in both face-to-face and non-face-to-face activities for this visit on the date of this encounter.   This document was prepared using Dragon voice recognition software and may include unintentional dictation errors.  Bishop Limbo DNP, MBA, PMHNP-BC, FNP-BC  Psychiatric Mental Health Nurse Practitioner [facility]

## 2022-12-24 NOTE — ED Notes (Signed)
NT stuck pt for lab work x2, unsuccessful.

## 2022-12-24 NOTE — ED Provider Notes (Incomplete)
[facility]    Event Date/Time   First MD Initiated Contact with Patient 12/24/22 2254     (approximate)   History   Psychiatric Evaluation   HPI Shari [name] is a 27 y.o. female who reports a prior psychiatric history involving at least 1 psychiatric hospitalization.  She is brought to the ED under involuntary commitment by law enforcement.  She was found [place] a bridge with a cinderblock and a rope tied to herself in the block.  She was reportedly threatening to jump although she denies telling law enforcement that that was her plan.  She now says that she is totally fine and she "will say anything I need to say for you to let me go."  She states she was just upset but now she feels better.  She did not elaborate about what was making her upset.  She states she has a history of asthma and said that she occasionally uses an albuterol inhaler and that she used it more earlier today but she does not have any trouble breathing right now.     Physical Exam   Triage Vital Signs: ED Triage Vitals  Encounter Vitals Group     BP 12/24/22 2238 136/83     Systolic BP Percentile --      Diastolic BP Percentile --      Pulse Rate 12/24/22 2238 85     Resp 12/24/22 2238 18     Temp 12/24/22 2238 98.8 F (37.1 C)     Temp Source 12/24/22 2238 Oral     SpO2 12/24/22 2238 99 %     Weight 12/24/22 2237 122.5 kg (270 lb)     Height 12/24/22 2237 1.727 m (5\' 8" )     Head Circumference --      Peak Flow --      Pain Score 12/24/22 2237 0     Pain Loc --      Pain Education --      Exclude from Growth Chart --     Most recent vital signs: Vitals:   12/24/22 2238  BP: 136/83  Pulse: 85  Resp: 18  Temp: 98.8 F (37.1 C)  SpO2: 99%    General: Awake, no obvious distress.  Left eye with some mild conjunctival injection, no discharge, otherwise normal-appearing. CV:  Good peripheral perfusion.  Resp:  Normal effort. Speaking easily and  comfortably, no accessory muscle usage nor intercostal retractions.   Abd:  No distention.  Other:  Patient currently denying suicidal ideation but admits to tying a rope to herself into a cinderblock and standing on a bridge.   ED Results / Procedures / Treatments   Labs (all labs ordered are listed, but only abnormal results are displayed) Labs Reviewed  COMPREHENSIVE METABOLIC PANEL - Abnormal; Notable for the following components:      Result Value   Potassium 3.2 (*)    Glucose, Bld 109 (*)    Calcium 8.2 (*)    AST 14 (*)    All other components within normal limits  SALICYLATE LEVEL - Abnormal; Notable for the following components:   Salicylate Lvl <7.0 (*)    All other components within normal limits  ACETAMINOPHEN LEVEL - Abnormal; Notable for the following components:   Acetaminophen (Tylenol), Serum <10 (*)    All other components within normal limits  CBC - Abnormal; Notable for the following components:   Hemoglobin 11.1 (*)    HCT 33.8 (*)  All other components within normal limits  URINE DRUG SCREEN, QUALITATIVE ([facility] ONLY) - Abnormal; Notable for the following components:   Cannabinoid 50 Ng, Ur [place] POSITIVE (*)    All other components within normal limits  ETHANOL  PREGNANCY, URINE      PROCEDURES:  Critical Care performed: No  Procedures    IMPRESSION / MDM / ASSESSMENT AND PLAN / ED COURSE  I reviewed the triage vital signs and the nursing notes.                              Differential diagnosis includes, but is not limited to, depression, adjustment disorder, mood disorder.  Patient's presentation is most consistent with acute presentation with potential threat to life or bodily function.  Labs/studies ordered: As per protocol, I ordered the following labs as part of the patient's medical and psychiatric evaluation:  CBC, CMP, ethanol level, acetaminophen level, salicylate level, urine drug screen.  Interventions/Medications given:   Medications  albuterol (VENTOLIN HFA) 108 (90 Base) MCG/ACT inhaler 1-2 puff (has no administration in time range)  potassium chloride SA (KLOR-CON M) CR tablet 40 mEq (40 mEq Oral Given 12/25/22 0139)    (Note:  hospital course my include additional interventions and/or labs/studies not listed above.)   Patient's behavior is very concerning for active suicidal ideation and intent to kill herself.  Also concerning is her statement that she is willing to say anything to be allowed to leave.  I encouraged her to be honest when she speaks with psychiatry about what she did and why she did it, but I explained that we must keep her safe given the severity of the situation in which law enforcement found her.  She is displeased, but is not immediately presenting a danger to herself or anyone else in terms of her behavior in the emergency department.  No medical complaints or concerns.  Potassium level is very slightly low which is not clinically significant but I ordered 40 mill equivalents of potassium by mouth for a morning dose because she currently is refusing medications.  I am consulting psychiatry.  The patient has been placed in psychiatric observation due to the need to provide a safe environment for the patient while obtaining psychiatric consultation and evaluation, as well as ongoing medical and medication management to treat the patient's condition.  The patient has been placed under full IVC at this time.      Clinical Course as of 12/25/22 5784  Wed Dec 25, 2022  0031 The patient has been evaluated by Bishop Limbo, psych NP, who is recommending inpatient treatment.  [name] recommended to the patient that she take something to help her sleep, but the patient is currently refusing medications. [CF]  0211 Patient admitted to [facility] [CF]    Clinical Course User Index [CF] Loleta Rose, MD     FINAL CLINICAL IMPRESSION(S) / ED DIAGNOSES   Final diagnoses:   Suicidal ideation     Rx / DC Orders   ED Discharge Orders     None        Note:  This document was prepared using Dragon voice recognition software and may include unintentional dictation errors.   Loleta Rose, MD 12/25/22 Mike Gip    Loleta Rose, MD 12/25/22 [phone]

## 2022-12-25 ENCOUNTER — Inpatient Hospital Stay
Admission: AD | Admit: 2022-12-25 | Discharge: 2022-12-28 | DRG: 880 | Disposition: A | Discharge status: Home / Self Care | Payer: 59 | Source: Intra-hospital | Attending: Psychiatry | Admitting: Psychiatry

## 2022-12-25 ENCOUNTER — Encounter: Payer: Self-pay | Admitting: Psychiatry

## 2022-12-25 DIAGNOSIS — R45851 Suicidal ideations: Principal | ICD-10-CM | POA: Diagnosis present

## 2022-12-25 DIAGNOSIS — E7800 Pure hypercholesterolemia, unspecified: Secondary | ICD-10-CM | POA: Diagnosis present

## 2022-12-25 DIAGNOSIS — Z885 Allergy status to narcotic agent status: Secondary | ICD-10-CM | POA: Diagnosis not present

## 2022-12-25 DIAGNOSIS — G4700 Insomnia, unspecified: Secondary | ICD-10-CM | POA: Diagnosis present

## 2022-12-25 DIAGNOSIS — F419 Anxiety disorder, unspecified: Secondary | ICD-10-CM | POA: Diagnosis present

## 2022-12-25 DIAGNOSIS — F209 Schizophrenia, unspecified: Secondary | ICD-10-CM | POA: Diagnosis present

## 2022-12-25 DIAGNOSIS — F603 Borderline personality disorder: Secondary | ICD-10-CM | POA: Diagnosis present

## 2022-12-25 DIAGNOSIS — F32A Depression, unspecified: Secondary | ICD-10-CM | POA: Diagnosis present

## 2022-12-25 DIAGNOSIS — Z91199 Patient's noncompliance with other medical treatment and regimen due to unspecified reason: Secondary | ICD-10-CM | POA: Diagnosis not present

## 2022-12-25 DIAGNOSIS — Z592 Discord with neighbors, lodgers and landlord: Secondary | ICD-10-CM

## 2022-12-25 DIAGNOSIS — Z811 Family history of alcohol abuse and dependence: Secondary | ICD-10-CM

## 2022-12-25 DIAGNOSIS — Z813 Family history of other psychoactive substance abuse and dependence: Secondary | ICD-10-CM | POA: Diagnosis not present

## 2022-12-25 DIAGNOSIS — R4589 Other symptoms and signs involving emotional state: Principal | ICD-10-CM | POA: Diagnosis present

## 2022-12-25 DIAGNOSIS — Z79899 Other long term (current) drug therapy: Secondary | ICD-10-CM | POA: Diagnosis not present

## 2022-12-25 DIAGNOSIS — Z818 Family history of other mental and behavioral disorders: Secondary | ICD-10-CM

## 2022-12-25 DIAGNOSIS — Z882 Allergy status to sulfonamides status: Secondary | ICD-10-CM | POA: Diagnosis not present

## 2022-12-25 DIAGNOSIS — E119 Type 2 diabetes mellitus without complications: Secondary | ICD-10-CM | POA: Diagnosis present

## 2022-12-25 DIAGNOSIS — T1491XA Suicide attempt, initial encounter: Secondary | ICD-10-CM | POA: Diagnosis not present

## 2022-12-25 DIAGNOSIS — Z7984 Long term (current) use of oral hypoglycemic drugs: Secondary | ICD-10-CM

## 2022-12-25 DIAGNOSIS — F1210 Cannabis abuse, uncomplicated: Secondary | ICD-10-CM | POA: Diagnosis present

## 2022-12-25 LAB — GLUCOSE, CAPILLARY
Glucose-Capillary: 108 mg/dL — ABNORMAL HIGH (ref 70–99)
Glucose-Capillary: 160 mg/dL — ABNORMAL HIGH (ref 70–99)

## 2022-12-25 LAB — URINE DRUG SCREEN, QUALITATIVE (ARMC ONLY)
Amphetamines, Ur Screen: NOT DETECTED
Barbiturates, Ur Screen: NOT DETECTED
Benzodiazepine, Ur Scrn: NOT DETECTED
Cannabinoid 50 Ng, Ur ~~LOC~~: POSITIVE — AB
Cocaine Metabolite,Ur ~~LOC~~: NOT DETECTED
MDMA (Ecstasy)Ur Screen: NOT DETECTED
Methadone Scn, Ur: NOT DETECTED
Opiate, Ur Screen: NOT DETECTED
Phencyclidine (PCP) Ur S: NOT DETECTED
Tricyclic, Ur Screen: NOT DETECTED

## 2022-12-25 LAB — ACETAMINOPHEN LEVEL: Acetaminophen (Tylenol), Serum: <10 ug/mL — ABNORMAL LOW (ref 10–30)

## 2022-12-25 LAB — ETHANOL: Alcohol, Ethyl (B): <10 mg/dL (ref <10)

## 2022-12-25 LAB — SALICYLATE LEVEL: Salicylate Lvl: <7 mg/dL — ABNORMAL LOW (ref 7.0–30.0)

## 2022-12-25 LAB — PREGNANCY, URINE: Preg Test, Ur: NEGATIVE

## 2022-12-25 MED ORDER — ALUM & MAG HYDROXIDE-SIMETH 200-200-20 MG/5ML PO SUSP
30.0000 mL | ORAL | Status: DC | PRN
Start: 2022-12-25 — End: 2022-12-28

## 2022-12-25 MED ORDER — INSULIN GLARGINE-YFGN 100 UNIT/ML ~~LOC~~ SOLN
5.0000 [IU] | Freq: Every day | SUBCUTANEOUS | Status: DC
  Administered 2022-12-25 – 2022-12-27 (×3): 5 [IU] via SUBCUTANEOUS
  Filled 2022-12-25 (×4): qty 0.05

## 2022-12-25 MED ORDER — ACETAMINOPHEN 325 MG PO TABS: 650.0000 mg | ORAL_TABLET | Freq: Four times a day (QID) | ORAL | Status: DC | PRN

## 2022-12-25 MED ORDER — HYDROXYZINE HCL 25 MG PO TABS
25.0000 mg | ORAL_TABLET | Freq: Three times a day (TID) | ORAL | Status: DC | PRN
  Administered 2022-12-27: 25 mg via ORAL
  Filled 2022-12-25: qty 1

## 2022-12-25 MED ORDER — ALBUTEROL SULFATE HFA 108 (90 BASE) MCG/ACT IN AERS
2.0000 | INHALATION_SPRAY | Freq: Four times a day (QID) | RESPIRATORY_TRACT | Status: DC | PRN
  Administered 2022-12-25: 2 via RESPIRATORY_TRACT

## 2022-12-25 MED ORDER — POTASSIUM CHLORIDE CRYS ER 20 MEQ PO TBCR
40.0000 meq | EXTENDED_RELEASE_TABLET | Freq: Once | ORAL | Status: AC
  Administered 2022-12-25: 40 meq via ORAL

## 2022-12-25 MED ORDER — MAGNESIUM HYDROXIDE 400 MG/5ML PO SUSP: 30.0000 mL | Freq: Every day | ORAL | Status: DC | PRN

## 2022-12-25 MED ORDER — TRAZODONE HCL 50 MG PO TABS
50.0000 mg | ORAL_TABLET | Freq: Every evening | ORAL | Status: DC | PRN
  Filled 2022-12-25: qty 1

## 2022-12-25 MED ORDER — IPRATROPIUM-ALBUTEROL 0.5-2.5 (3) MG/3ML IN SOLN
3.0000 mL | Freq: Four times a day (QID) | RESPIRATORY_TRACT | Status: DC | PRN
  Administered 2022-12-26: 3 mL via RESPIRATORY_TRACT
  Filled 2022-12-25 (×2): qty 3

## 2022-12-25 NOTE — ED Notes (Signed)
Attempted to give pt PO potassium - pt refusing at this time.

## 2022-12-25 NOTE — Tx Team (Signed)
Initial Treatment Plan 12/25/2022 2:31 AM [name] KXF:[id number]    PATIENT STRESSORS: Substance abuse   Other: Patient minimizes, says she was upset, under stress     PATIENT STRENGTHS: Average or above average intelligence  Communication skills    PATIENT IDENTIFIED PROBLEMS: Suicidal Ideation  Bipolar Depression  Substance Use                 DISCHARGE CRITERIA:  Improved stabilization in mood, thinking, and/or behavior  PRELIMINARY DISCHARGE PLAN: Outpatient therapy  PATIENT/FAMILY INVOLVEMENT: This treatment plan has been presented to and reviewed with the patient, Shari [name], and/or family member, .  The patient and family have been given the opportunity to ask questions and make suggestions.  Shelia Media, RN 12/25/2022, 2:31 AM

## 2022-12-25 NOTE — Group Note (Signed)
Recreation Therapy Group Note   Group Topic:Emotion Expression  Group Date: 12/25/2022 Start Time: 1045 End Time: 1135 Facilitators: Rosina Lowenstein, LRT, CTRS Location:  Craft Room  Group Description: Gratitude Journaling. Patients and LRT discussed what gratitude means, how we can express it and what it means to [place], personally. LRT gave an education handout on the definition of gratitude that also gave different examples of gratitude exercises that they could try. One of the examples was "Gratitude Letter", which prompted patient to write a letter to someone they appreciate. LRT played soft music while everyone wrote their letter. Once letter was completed, LRT encouraged people to read their letter if they wanted to; or share who they wrote it to, at minimum. LRT and pts talked about the benefits of journaling and how it can be used as a positive coping skill. LRT and pts processed how showing gratitude towards themselves, and others can be applied to everyday life post-discharge. LRT offered journals to pts afterwards.   Goal Area(s) Addressed:  Patient will identify the definition of gratitude. Patient will learn different gratitude exercises. Patient will practice writing/journaling as a coping skill.  Patient will identify a new coping skill.   Affect/Mood: N/A   Participation Level: Did not attend    Clinical Observations/Individualized Feedback: Shari [name] did not attend group.   Plan: Continue to engage patient in RT group sessions 2-3x/week.   Rosina Lowenstein, LRT, CTRS 12/25/2022 12:13 PM

## 2022-12-25 NOTE — BH IP Treatment Plan (Signed)
Interdisciplinary Treatment and Diagnostic Plan Update  12/25/2022 Time of Session: 9:40AM Shari [name] MRN: [id number]  Principal Diagnosis: Suicidal behavior  Secondary Diagnoses: Principal Problem:   Suicidal behavior   Current Medications:  Current Facility-Administered Medications  Medication Dose Route Frequency Provider Last Rate Last Admin   acetaminophen (TYLENOL) tablet 650 mg  650 mg Oral Q6H PRN Foust, Katy L, NP       alum & mag hydroxide-simeth (MAALOX/MYLANTA) 200-200-20 MG/5ML suspension 30 mL  30 mL Oral Q4H PRN Foust, Katy L, NP       hydrOXYzine (ATARAX) tablet 25 mg  25 mg Oral TID PRN Foust, Katy L, NP       magnesium hydroxide (MILK OF MAGNESIA) suspension 30 mL  30 mL Oral Daily PRN Foust, Katy L, NP       traZODone (DESYREL) tablet 50 mg  50 mg Oral QHS PRN Foust, Katy L, NP       PTA Medications: Medications Prior to Admission  Medication Sig Dispense Refill Last Dose   Blood Glucose Monitoring Suppl (FIFTY50 GLUCOSE METER 2.0) w/Device KIT Use as instructed. Ok to dispense whichever glucometer is covered by insurance E11.65 (Patient not taking: Reported on 08/23/2021)      Dulaglutide 0.75 MG/0.5ML SOPN Inject into the skin. (Patient not taking: Reported on 08/23/2021)      etonogestrel (NEXPLANON) 68 MG IMPL implant 68 mg by Subdermal route once.  (Patient not taking: Reported on 07/22/2022)      exenatide (BYETTA 10 MCG PEN) 10 MCG/0.04ML SOPN injection Inject 10 mcg into the skin 2 (two) times daily with a meal. (Patient not taking: Reported on 08/23/2021)      ezetimibe (ZETIA) 10 MG tablet Take 10 mg by mouth daily. (Patient not taking: Reported on 08/23/2021)      FEROSUL 325 (65 Fe) MG tablet Take 325 mg by mouth daily. (Patient not taking: Reported on 08/23/2021)      FIASP FLEXTOUCH 100 UNIT/ML FlexTouch Pen Inject into the skin. (Patient not taking: Reported on 08/23/2021)      fluticasone (FLONASE) 50 MCG/ACT nasal spray Place into both nostrils.  (Patient not taking: Reported on 08/23/2021)      Fluticasone-Salmeterol (ADVAIR) 250-50 MCG/DOSE AEPB fluticasone 250 mcg-salmeterol 50 mcg/dose blistr powdr for inhalation (Patient not taking: Reported on 08/23/2021)      glucose blood (PRECISION QID TEST) test strip Dispense 100 blood glucose test strips, ok to sub any brand preferred by insurance/patient to match with meter, use 3x/day, dx E11.65 (Patient not taking: Reported on 08/23/2021)      Insulin Degludec (TRESIBA FLEXTOUCH) 200 UNIT/ML SOPN Inject into the skin. (Patient not taking: Reported on 08/23/2021)      insulin glargine (LANTUS SOLOSTAR) 100 UNIT/ML Solostar Pen Use up to 60 units daily (Patient not taking: Reported on 08/23/2021)      ipratropium-albuterol (DUONEB) 0.5-2.5 (3) MG/3ML SOLN Take 3 mLs by nebulization every 6 (six) hours as needed. 360 mL 0    JARDIANCE 25 MG TABS tablet  (Patient not taking: Reported on 08/23/2021)      ketoconazole (NIZORAL) 2 % cream Apply 1 application topically 2 (two) times daily. (Patient not taking: Reported on 08/23/2021)      letrozole [facility]) 2.5 MG tablet  (Patient not taking: Reported on 08/23/2021)      metFORMIN (GLUCOPHAGE-XR) 500 MG 24 hr tablet  (Patient not taking: Reported on 05/30/2021)      metroNIDAZOLE (FLAGYL) 500 MG tablet Take 500 mg by mouth 2 (two)  times daily. (Patient not taking: Reported on 08/23/2021)      omeprazole (PRILOSEC) 20 MG capsule Take 1 capsule by mouth daily. (Patient not taking: Reported on 07/22/2022)      OZEMPIC, 0.25 OR 0.5 MG/DOSE, 2 MG/1.5ML SOPN Inject 1 mg into the skin. (Patient not taking: Reported on 07/22/2022)      PROAIR HFA 108 (90 Base) MCG/ACT inhaler Inhale 2 puffs into the lungs every 6 (six) hours as needed for wheezing or shortness of breath.       rosuvastatin (CRESTOR) 40 MG tablet TAKE 1 TABLET BY MOUTH ONCE DAILY FOR HIGH CHOLESTEROL      SEMGLEE, YFGN, 100 UNIT/ML Pen Inject into the skin. (Patient not taking: Reported on 08/23/2021)       TRADJENTA 5 MG TABS tablet  (Patient not taking: Reported on 05/30/2021)      traZODone (DESYREL) 100 MG tablet Take 1-1.5 tablets (100-150 mg total) by mouth at bedtime as needed for sleep. (Patient not taking: Reported on 08/23/2021) 45 tablet 2    TRUEPLUS PEN NEEDLES 32G X 4 MM MISC  (Patient not taking: Reported on 08/23/2021)      valACYclovir (VALTREX) 1000 MG tablet Take by mouth. (Patient not taking: Reported on 08/23/2021)       Patient Stressors: Substance abuse   Other: Patient minimizes, says she was upset, under stress    Patient Strengths: Average or above average intelligence  Communication skills   Treatment Modalities: Medication Management, Group therapy, Case management,  1 to 1 session with clinician, Psychoeducation, Recreational therapy.   Physician Treatment Plan for Primary Diagnosis: Suicidal behavior Long Term Goal(s):     Short Term Goals:    Medication Management: Evaluate patient's response, side effects, and tolerance of medication regimen.  Therapeutic Interventions: 1 to 1 sessions, Unit Group sessions and Medication administration.  Evaluation of Outcomes: Not Met  Physician Treatment Plan for Secondary Diagnosis: Principal Problem:   Suicidal behavior  Long Term Goal(s):     Short Term Goals:       Medication Management: Evaluate patient's response, side effects, and tolerance of medication regimen.  Therapeutic Interventions: 1 to 1 sessions, Unit Group sessions and Medication administration.  Evaluation of Outcomes: Not Met   RN Treatment Plan for Primary Diagnosis: Suicidal behavior Long Term Goal(s): Knowledge of disease and therapeutic regimen to maintain health will improve  Short Term Goals: Ability to demonstrate self-control, Ability to participate in decision making will improve, Ability to verbalize feelings will improve, Ability to disclose and discuss suicidal ideas, Ability to identify and develop effective coping behaviors will  improve, and Compliance with prescribed medications will improve  Medication Management: RN will administer medications as ordered by provider, will assess and evaluate patient's response and provide education to patient for prescribed medication. RN will report any adverse and/or side effects to prescribing provider.  Therapeutic Interventions: 1 on 1 counseling sessions, Psychoeducation, Medication administration, Evaluate responses to treatment, Monitor vital signs and CBGs as ordered, Perform/monitor CIWA, COWS, AIMS and Fall Risk screenings as ordered, Perform wound care treatments as ordered.  Evaluation of Outcomes: Not Met   LCSW Treatment Plan for Primary Diagnosis: Suicidal behavior Long Term Goal(s): Safe transition to appropriate next level of care at discharge, Engage patient in therapeutic group addressing interpersonal concerns.  Short Term Goals: Engage patient in aftercare planning with referrals and resources, Increase social support, Increase ability to appropriately verbalize feelings, Increase emotional regulation, Facilitate acceptance of mental health diagnosis and concerns, and Increase  skills for wellness and recovery  Therapeutic Interventions: Assess for all discharge needs, 1 to 1 time with Social worker, Explore available resources and support systems, Assess for adequacy in community support network, Educate family and significant other(s) on suicide prevention, Complete Psychosocial Assessment, Interpersonal group therapy.  Evaluation of Outcomes: Not Met   Progress in Treatment: Attending groups: No. Participating in groups: No. Taking medication as prescribed: No. Toleration medication: No. Family/Significant other contact made: No, will contact:  once permission has been granted Patient understands diagnosis: Yes. Discussing patient identified problems/goals with staff: Yes. Medical problems stabilized or resolved: Yes. Denies suicidal/homicidal ideation:  Yes. Issues/concerns per patient self-inventory: No. Other: none  New problem(s) identified: No, Describe:  none  New Short Term/Long Term Goal(s): detox, elimination of symptoms of psychosis, medication management for mood stabilization; elimination of SI thoughts; development of comprehensive mental wellness/sobriety plan.   Patient Goals:   "to leave here and do what I need to do to keep my mental health in check"  Discharge Plan or Barriers:  CSW to assist patient in development of appropriate discharge plans.  Patient reports that she does not want to be on the unit.  Patient is declining medications and participation.  Patient is not aligned with treatment at this time.  Reason for Continuation of Hospitalization: Anxiety Depression Medication stabilization Suicidal ideation  Estimated Length of Stay:  1-7 days  Last 3 [place] Suicide Severity Risk Score: Flowsheet Row Admission (Current) from 12/25/2022 in [facility]PATIENT BEH[facility]INE ED from 12/24/2022 in [facility] Emergency Department at [facility] ED from 12/09/2022 in [facility] Emergency Department at [facility]  C-SSRS RISK CATEGORY No Risk No Risk No Risk       Last PHQ 2/9 Scores:    08/23/2021   11:13 AM 08/23/2021   10:04 AM 05/30/2021   11:16 AM  Depression screen PHQ 2/9  Decreased Interest  0 0  Down, Depressed, Hopeless  3 0  PHQ - 2 Score  3 0  Altered sleeping 0  0  Tired, decreased energy 0  0  Change in appetite 0  0  Feeling bad or failure about yourself  3  0  Trouble concentrating 0  0  Moving slowly or fidgety/restless 0  0  Suicidal thoughts 3  0  PHQ-9 Score   0  Difficult doing work/chores Somewhat difficult  Not difficult at all    Scribe for Treatment Team: Harden Mo, LCSW 12/25/2022 11:48 AM

## 2022-12-25 NOTE — Progress Notes (Signed)
   12/25/22 1100  Psych Admission Type (Psych Patients Only)  Admission Status Involuntary  Psychosocial Assessment  Patient Complaints Irritability  Eye Contact Fair;Intense  Facial Expression Anxious  Affect Irritable  Speech Aggressive;Pressured;Rapid;Argumentative  Interaction Evasive;Guarded  Motor Activity Slow  Appearance/Hygiene Layered clothes;Unremarkable  Behavior Characteristics Agressive verbally;Agitated;Irritable;Resistant to care  Mood Irritable;Preoccupied;Labile  Aggressive Behavior  Effect No apparent injury  Thought Process  Coherency Circumstantial  Content Blaming others  Delusions None reported or observed  Perception WDL  Hallucination None reported or observed  Judgment Poor  Confusion None  Danger to Self  Current suicidal ideation? Denies  Danger to Others  Danger to Others None reported or observed

## 2022-12-25 NOTE — Plan of Care (Signed)
Problem: Safety: Goal: Ability to disclose and discuss suicidal ideas will improve Outcome: Progressing

## 2022-12-25 NOTE — Progress Notes (Signed)
Pt was given her Albuterol inhaler in the ED just prior to coming to our unit. The RN in the ED called me to tell me she was given the inhaler but it was not scanned in the [facility].

## 2022-12-25 NOTE — [facility] Suicide Risk Assessment (Signed)
[facility] INPATIENT:  Family/Significant Other Suicide Prevention Education  Suicide Prevention Education:  Patient Refusal for Family/Significant Other Suicide Prevention Education: The patient Shari [name] has refused to provide written consent for family/significant other to be provided Family/Significant Other Suicide Prevention Education during admission and/or prior to discharge.  Physician notified.  Lowry Ram 12/25/2022, 4:09 PM
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
## 2022-12-25 NOTE — ED Notes (Signed)
Ivc/ psych consult pending

## 2022-12-25 NOTE — Group Note (Signed)
Date:  12/25/2022 Time:  10:10 AM  Group Topic/Focus:  Goals Group:   The focus of this group is to help patients establish daily goals to achieve during treatment and discuss how the patient can incorporate goal setting into their daily lives to aide in recovery.    Participation Level:  Did Not Attend   Lynelle Smoke [facility] 12/25/2022, 10:10 AM

## 2022-12-25 NOTE — BHH Suicide Risk Assessment (Addendum)
[facility] Admission Suicide Risk Assessment   Nursing information obtained from:  Patient, Review of record Demographic factors:  Unemployed Current Mental Status:  Suicidal ideation indicated by others Loss Factors:  NA Historical Factors:  NA Risk Reduction Factors:  Employed  Total Time spent with patient: 2.5 hours Principal Problem: Suicidal behavior Diagnosis:  Principal Problem:   Suicidal behavior  Subjective Data:  27 year old [place] American female with a history of schizophrenia, borderline personality disorder, depression, anxiety, and cannabis use disorder, presenting under Involuntary Commitment (IVC) after law enforcement intervention. The patient was brought to the emergency department after reportedly tying a rope to a cinder block and driving to a bypass over the [facility] with suicidal intent. During the evaluation, she acknowledged the event but minimized its severity, stating she is "fine now" and does not need treatment. She reported stressors including a recent breakup, premenstrual mood changes, and increased workload following a promotion, which she described as overwhelming.The patient denies current suicidal ideation, homicidal ideation, psychosis, or substance use. She declined consent to obtain collateral information from family or friends. She refused medication, including her diabetic medications, stating, "I am fine; I don't need anything, just want to go home." She expressed anger and irritability about the hospitalization, asserting she would not participate in treatment or take medications.She also refuses diabetic medications. She acknowledges her history of depression and anxiety but does not perceive her current condition as requiring inpatient care.  Continued Clinical Symptoms:  Alcohol Use Disorder Identification Test Final Score (AUDIT): 0 The "Alcohol Use Disorders Identification Test", Guidelines for Use in Primary Care, Second Edition.  World Environmental consultant [facility]). Score between 0-7:  no or low risk or alcohol related problems. Score between 8-15:  moderate risk of alcohol related problems. Score between 16-19:  high risk of alcohol related problems. Score 20 or above:  warrants further diagnostic evaluation for alcohol dependence and treatment.   CLINICAL FACTORS:   Severe Anxiety and/or Agitation Depression:   Comorbid alcohol abuse/dependence Impulsivity Alcohol/Substance Abuse/Dependencies Schizophrenia:   Less than 67 years old   Musculoskeletal: Strength & Muscle Tone: within normal limits Gait & Station: normal Patient leans: N/A  Psychiatric Specialty Exam:  Presentation  General Appearance:  Appropriate for Environment; Casual (Dressed in ED scrubs)  Eye Contact: Good  Speech: Clear and Coherent  Speech Volume: Normal  Handedness: Right   Mood and Affect  Mood: Anxious  Affect: Flat   Thought Process  Thought Processes: Coherent  Descriptions of Associations:Intact  Orientation:Full (Time, Place and Person)  Thought Content:WDL  History of Schizophrenia/Schizoaffective disorder:No  Duration of Psychotic Symptoms:No data recorded Hallucinations:Hallucinations: None  Ideas of Reference:None  Suicidal Thoughts:Suicidal Thoughts: No (Patient denies suicidal ideation, she was found by police sitting in her car on a bridge over [facility] with a rope tied around a cinder block)  Homicidal Thoughts:Homicidal Thoughts: No   Sensorium  Memory: Immediate Good; Recent Good; Remote Good  Judgment: Poor  Insight: Poor   Executive Functions  Concentration: Good  Attention Span: Good  Recall: Good  Fund of Knowledge: Good  Language: Good   Psychomotor Activity  Psychomotor Activity: Psychomotor Activity: Normal   Assets  Assets: Communication Skills; Housing; Transportation   Sleep  Sleep: Sleep: Good Number of Hours of Sleep: 7    Physical  Exam: Physical Exam Vitals and nursing note reviewed.  Constitutional:      Appearance: Normal appearance.  HENT:     Head: Normocephalic and atraumatic.  Nose: Nose normal.  Pulmonary:     Effort: Pulmonary effort is normal.  Musculoskeletal:        General: Swelling present.     Cervical back: Normal range of motion.  Neurological:     General: No focal deficit present.     Mental Status: She is alert and oriented to person, place, and time. Mental status is at baseline.  Psychiatric:        Attention and Perception: Attention and perception normal.        Mood and Affect: Mood is anxious. Affect is flat.        Speech: Speech normal.        Behavior: Behavior is withdrawn.        Thought Content: Thought content normal.        Cognition and Memory: Cognition and memory normal.        Judgment: Judgment is impulsive.    Review of Systems  Psychiatric/Behavioral:  Positive for substance abuse. The patient is nervous/anxious and has insomnia.   All other systems reviewed and are negative.  Blood pressure 121/61, pulse 76, temperature 98.4 F (36.9 C), temperature source Oral, resp. rate 17, height 5\' 8"  (1.727 m), weight 122 kg, last menstrual period 12/08/2022, SpO2 100%. Body mass index is 40.9 kg/m.   COGNITIVE FEATURES THAT CONTRIBUTE TO RISK:  Closed-mindedness    SUICIDE RISK:   Mild:  Suicidal ideation of limited frequency, intensity, duration, and specificity.  There are no identifiable plans, no associated intent, mild dysphoria and related symptoms, good self-control (both objective and subjective assessment), few other risk factors, and identifiable protective factors, including available and accessible social support.  PLAN OF CARE:  Educate the patient on the importance of treatment and the expectations of the inpatient setting, including structured therapy and medication options. Educate the patient on the importance of treatment, focusing on the role of  non-pharmacologic interventions if medication is not an option. Implement milieu therapy to provide a structured and supportive environment. Utilize crisis-oriented therapy to address immediate stressors and improve emotional regulation. Monitor blood glucose levels and provide education about the risks of uncontrolled diabetes I certify that inpatient services furnished can reasonably be expected to improve the patient's condition.   Myriam Forehand, NP 12/25/2022, 4:15 PM

## 2022-12-25 NOTE — Inpatient Diabetes Management (Signed)
Inpatient Diabetes Program Recommendations  AACE/ADA: New Consensus Statement on Inpatient Glycemic Control (2015)  Target Ranges:  Prepandial:   less than 140 mg/dL      Peak postprandial:   less than 180 mg/dL (1-2 hours)      Critically ill patients:  140 - 180 mg/dL    Latest Reference Range & Units 12/24/22 23:36  Glucose 70 - 99 mg/dL 528 (H)  (H): Data is abnormally high    Admit with: IVC due to SI  History: Diabetes, Schizophrenia, Borderline personality disorder   Home DM Meds: The following meds are listed but pt NOT taking any of them: Trulicity  Qweek no dose listed Byetta Injection no dose listed Fiasp Insulin no dose listed Tresiba Insulin no dose listed Lantus Insulin 60 units daily Jardiance no dose listed Metformin no dose listed Ozempic 1 mg Qweek Semglee Insulin no dose listed Tradjenta no dose listed  Current Orders: None   MD- Please consider for in-hospital glucose control:  Start Novolog Sensitive Correction Scale/ SSI (0-9 units) TID AC + HS (Use Glycemic Control Order set)  I don't have any blood sugar data to make recs on basal insulin yet   Unsure why pt has so many different medications listed in her home meds?? Multiple types of Insulin--Multiple types of oral meds.  Upon Chart Review, pt last saw PCP Ali Lowe, NP with [facility] on 11/19/2022 Told PCP she was taking Toujeo 39 units QPM + Fiasp 5 units at [employer] [facility] Pt told PCP she wants to stop insulin but PCP discussed with pt that this is not a realistic goal for her Added Rybelsus at that visit PCP wants pt to go to ENDO pt pt refusing A1c at that visit was 8.5%    --Will follow patient during hospitalization--  Ambrose Finland RN, MSN, CDCES Diabetes Coordinator Inpatient Glycemic Control Team Team Pager: [phone] (8a-5p)

## 2022-12-25 NOTE — Group Note (Signed)
Date:  12/25/2022 Time:  10:21 PM  Group Topic/Focus:  Wrap-Up Group:   The focus of this group is to help patients review their daily goal of treatment and discuss progress on daily workbooks.    Participation Level:  Active  Participation Quality:  Appropriate  Affect:  Appropriate  Cognitive:  Appropriate  Insight: Appropriate  Engagement in Group:  Engaged  Modes of Intervention:  Discussion  Lenore Cordia 12/25/2022, 10:21 PM

## 2022-12-25 NOTE — Group Note (Signed)
Date:  12/25/2022 Time:  5:29 PM  Group Topic/Focus:  Self Care:   The focus of this group is to help patients understand the importance of self-care in order to improve or restore emotional, physical, spiritual, interpersonal, and financial health.      Participation Level:  Active  Participation Quality:  Appropriate  Affect:  Appropriate  Cognitive:  Appropriate  Insight: Appropriate  Engagement in Group:  Engaged  Modes of Intervention:  Activity and Socialization  Additional Comments:    Wilford Corner 12/25/2022, 5:29 PM

## 2022-12-25 NOTE — ED Notes (Signed)
Dexcom reading of 63. Pt given [place] sandwich tray
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
## 2022-12-25 NOTE — Plan of Care (Signed)
  Problem: Education: Goal: Knowledge of [facility] Education information/materials will improve Outcome: Not Progressing Goal: Emotional status will improve Outcome: Not Progressing Goal: Mental status will improve Outcome: Not Progressing Goal: Verbalization of understanding the information provided will improve Outcome: Not Progressing   Problem: Activity: Goal: Interest or engagement in activities will improve Outcome: Not Progressing Goal: Sleeping patterns will improve Outcome: Not Progressing   Problem: Coping: Goal: Ability to verbalize frustrations and anger appropriately will improve Outcome: Not Progressing Goal: Ability to demonstrate self-control will improve Outcome: Not Progressing   Problem: Health Behavior/Discharge Planning: Goal: Identification of resources available to assist in meeting health care needs will improve Outcome: Not Progressing Goal: Compliance with treatment plan for underlying cause of condition will improve Outcome: Not Progressing   Problem: Physical Regulation: Goal: Ability to maintain clinical measurements within normal limits will improve Outcome: Not Progressing   Problem: Safety: Goal: Periods of time without injury will increase Outcome: Not Progressing   Problem: Education: Goal: Knowledge of [facility] Education information/materials will improve Outcome: Not Progressing Goal: Emotional status will improve Outcome: Not Progressing Goal: Mental status will improve Outcome: Not Progressing Goal: Verbalization of understanding the information provided will improve Outcome: Not Progressing   Problem: Activity: Goal: Interest or engagement in activities will improve Outcome: Not Progressing Goal: Sleeping patterns will improve Outcome: Not Progressing   Problem: Coping: Goal: Ability to verbalize frustrations and anger appropriately will improve Outcome: Not Progressing Goal: Ability to demonstrate  self-control will improve Outcome: Not Progressing   Problem: Health Behavior/Discharge Planning: Goal: Identification of resources available to assist in meeting health care needs will improve Outcome: Not Progressing Goal: Compliance with treatment plan for underlying cause of condition will improve Outcome: Not Progressing   Problem: Physical Regulation: Goal: Ability to maintain clinical measurements within normal limits will improve Outcome: Not Progressing   Problem: Safety: Goal: Periods of time without injury will increase Outcome: Not Progressing   Problem: Education: Goal: Ability to make informed decisions regarding treatment will improve Outcome: Not Progressing   Problem: Coping: Goal: Coping ability will improve Outcome: Not Progressing   Problem: Health Behavior/Discharge Planning: Goal: Identification of resources available to assist in meeting health care needs will improve Outcome: Not Progressing   Problem: Medication: Goal: Compliance with prescribed medication regimen will improve Outcome: Not Progressing   Problem: Self-Concept: Goal: Ability to disclose and discuss suicidal ideas will improve Outcome: Not Progressing   Problem: Education: Goal: Utilization of techniques to improve thought processes will improve Outcome: Not Progressing Goal: Knowledge of the prescribed therapeutic regimen will improve Outcome: Not Progressing   Problem: Activity: Goal: Interest or engagement in leisure activities will improve Outcome: Not Progressing Goal: Imbalance in normal sleep/wake cycle will improve Outcome: Not Progressing   Problem: Coping: Goal: Coping ability will improve Outcome: Not Progressing Goal: Will verbalize feelings Outcome: Not Progressing   Problem: Health Behavior/Discharge Planning: Goal: Ability to make decisions will improve Outcome: Not Progressing Goal: Compliance with therapeutic regimen will improve Outcome: Not  Progressing   Problem: Role Relationship: Goal: Will demonstrate positive changes in social behaviors and relationships Outcome: Not Progressing   Problem: Safety: Goal: Ability to disclose and discuss suicidal ideas will improve Outcome: Not Progressing Goal: Ability to identify and utilize support systems that promote safety will improve Outcome: Not Progressing   Problem: Self-Concept: Goal: Will verbalize positive feelings about self Outcome: Not Progressing Goal: Level of anxiety will decrease Outcome: Not Progressing

## 2022-12-25 NOTE — Progress Notes (Signed)
Pt admitted from [facility] - ED, report received from [place], [place]. Pt calm and pleasant during admission. Pt stated she had a bad day and that there were no specific stressors that led her to want to kill her self. She regrets it and is remorseful. Pt given education, support, and encouragement to be active in her treatment plan. Pt denies SI/HI/AVH with this Clinical research associate. Pt oriented to the unit and her room. Pt being monitored Q 15 minutes for safety per unit protocol, remains safe on the unit.
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
## 2022-12-26 DIAGNOSIS — R4589 Other symptoms and signs involving emotional state: Secondary | ICD-10-CM | POA: Diagnosis not present

## 2022-12-26 LAB — GLUCOSE, CAPILLARY
Glucose-Capillary: 123 mg/dL — ABNORMAL HIGH (ref 70–99)
Glucose-Capillary: 114 mg/dL — ABNORMAL HIGH (ref 70–99)
Glucose-Capillary: 118 mg/dL — ABNORMAL HIGH (ref 70–99)

## 2022-12-26 NOTE — Progress Notes (Signed)
   12/26/22 0800  Psych Admission Type (Psych Patients Only)  Admission Status Involuntary  Psychosocial Assessment  Patient Complaints None  Eye Contact Fair  Facial Expression Other (Comment) (appropriate)  Affect Appropriate to circumstance  Speech Logical/coherent  Interaction Assertive;Isolative (isolates to room, excpet for meals and phone calls)  Motor Activity Slow  Appearance/Hygiene Unremarkable  Behavior Characteristics Cooperative;Appropriate to situation  Mood Pleasant  Aggressive Behavior  Effect No apparent injury  Thought Process  Coherency WDL  Content WDL  Delusions None reported or observed  Perception WDL  Hallucination None reported or observed  Judgment WDL  Confusion None  Danger to Self  Current suicidal ideation? Denies  Danger to Others  Danger to Others None reported or observed

## 2022-12-26 NOTE — Progress Notes (Signed)
[facility] MD Progress Note  12/26/2022 6:36 PM Shari [name]  MRN:  [id number] Subjective:   27 year old African American female, reports, "I talked to my sister, and she has agreed to take me home." The patient previously refused to allow family to be involved in her care or know about her situation but has now agreed to let the LCSW speak with her family. She is bargaining to return home Principal Problem: Suicidal behavior without attempted self-injury Diagnosis: Principal Problem:   Suicidal behavior without attempted self-injury  Total Time spent with patient: 1.5 hours  Past Psychiatric History: Depression Anxiety Suicidal Behavior   Past Medical History:  Past Medical History:  Diagnosis Date   Anxiety    Asthma    well controlled   Diabetes mellitus without complication (HCC)    pt states she went off her meds because her numbers were fine   Endometriosis    Hidradenitis suppurativa 12/31 /2019   Pneumonia 01/2017   Refusal of blood product     Past Surgical History:  Procedure Laterality Date   ADENOIDECTOMY Bilateral 02/09/2018   Procedure: REVISION ADENOIDECTOMY;  Surgeon: Bud Face, MD;  Location: [facility] ORS;  Service: ENT;  Laterality: Bilateral;   EYE SURGERY     MYRINGOTOMY WITH TUBE PLACEMENT Right 02/09/2018   Procedure: MYRINGOTOMY WITH TUBE PLACEMENT;  Surgeon: Bud Face, MD;  Location: [facility] ORS;  Service: ENT;  Laterality: Right;   NASOPHARYNGOSCOPY EUSTATION TUBE BALLOON DILATION Bilateral 02/09/2018   Procedure: NASOPHARYNGOSCOPY EUSTATION TUBE BALLOON DILATION;  Surgeon: Bud Face, MD;  Location: [facility] ORS;  Service: ENT;  Laterality: Bilateral;   TONSILLECTOMY     TURBINATE REDUCTION Right 02/09/2018   Procedure: OUTFRACTURE RIGHT INFERIOR TURBINATE;  Surgeon: Bud Face, MD;  Location: [facility] ORS;  Service: ENT;  Laterality: Right;   Family History:  Family History  Problem Relation Age of Onset   Anxiety disorder Mother     Depression Mother    Alcohol abuse Father    Drug abuse Father    Schizophrenia Maternal Uncle    Family Psychiatric  History: see above Social History:  Social History   Substance and Sexual Activity  Alcohol Use Yes   Alcohol/week: 2.0 standard drinks of alcohol   Types: 2 Glasses of wine per week     Social History   Substance and Sexual Activity  Drug Use Not Currently   Types: Marijuana   Comment: last use 08/19/21    Social History   Socioeconomic History   Marital status: Single    Spouse name: Not on file   Number of children: 0   Years of education: Not on file   Highest education level: Associate degree: occupational, Scientist, product/process development, or vocational program  Occupational History   Not on file  Tobacco Use   Smoking status: Never   Smokeless tobacco: Never  Vaping Use   Vaping status: Never Used  Substance and Sexual Activity   Alcohol use: Yes    Alcohol/week: 2.0 standard drinks of alcohol    Types: 2 Glasses of wine per week   Drug use: Not Currently    Types: Marijuana    Comment: last use 08/19/21   Sexual activity: Yes    Partners: Male    Birth control/protection: None  Other Topics Concern   Not on file  Social History Narrative   Not on file   Social Determinants of Health   Financial Resource Strain: Low Risk  (09/02/2022)   [facility]  Overall Financial Resource Strain (CARDIA)    Difficulty of Paying Living Expenses: Not hard at all  Food Insecurity: No Food Insecurity (12/25/2022)   Hunger Vital Sign    Worried About Running Out of Food in the Last Year: Never true    Ran Out of Food in the Last Year: Never true  Transportation Needs: No Transportation Needs (12/25/2022)   PRAPARE - Administrator, Civil Service (Medical): No    Lack of Transportation (Non-Medical): No  Physical Activity: Unknown (02/19/2017)   Exercise Vital Sign    Days of Exercise per Week: 0 days    Minutes of Exercise per Session: Not  asked  Stress: Stress Concern Present (02/19/2017)   [employer] of Occupational Health - Occupational Stress Questionnaire    Feeling of Stress : Rather much  Social Connections: Unknown (06/12/2021)   Received from [facility]   Social Network    Social Network: Not on file   Additional Social History:                         Sleep: Good  Appetite:  Good  Current Medications: Current Facility-Administered Medications  Medication Dose Route Frequency Provider Last Rate Last Admin   acetaminophen (TYLENOL) tablet 650 mg  650 mg Oral Q6H PRN Foust, Katy L, NP       alum & mag hydroxide-simeth (MAALOX/MYLANTA) 200-200-20 MG/5ML suspension 30 mL  30 mL Oral Q4H PRN Foust, Katy L, NP       hydrOXYzine (ATARAX) tablet 25 mg  25 mg Oral TID PRN Foust, Katy L, NP       insulin glargine-yfgn (SEMGLEE) injection 5 Units  5 Units Subcutaneous QHS Myriam Forehand, NP   5 Units at 12/25/22 2313   ipratropium-albuterol (DUONEB) 0.5-2.5 (3) MG/3ML nebulizer solution 3 mL  3 mL Nebulization Q6H PRN Myriam Forehand, NP   3 mL at 12/26/22 1743   magnesium hydroxide (MILK OF MAGNESIA) suspension 30 mL  30 mL Oral Daily PRN Foust, Katy L, NP       traZODone (DESYREL) tablet 50 mg  50 mg Oral QHS PRN Foust, Katy L, NP        Lab Results:  Results for orders placed or performed during the hospital encounter of 12/25/22 (from the past 48 hour(s))  Glucose, capillary     Status: Abnormal   Collection Time: 12/25/22  4:31 PM  Result Value Ref Range   Glucose-Capillary 108 (H) 70 - 99 mg/dL    Comment: Glucose reference range applies only to samples taken after fasting for at least 8 hours.   Comment 1 Notify RN   Glucose, capillary     Status: Abnormal   Collection Time: 12/25/22  9:41 PM  Result Value Ref Range   Glucose-Capillary 160 (H) 70 - 99 mg/dL    Comment: Glucose reference range applies only to samples taken after fasting for at least 8 hours.  Glucose, capillary      Status: Abnormal   Collection Time: 12/26/22  8:08 AM  Result Value Ref Range   Glucose-Capillary 123 (H) 70 - 99 mg/dL    Comment: Glucose reference range applies only to samples taken after fasting for at least 8 hours.  Glucose, capillary     Status: Abnormal   Collection Time: 12/26/22  4:34 PM  Result Value Ref Range   Glucose-Capillary 114 (H) 70 - 99 mg/dL    Comment: Glucose  reference range applies only to samples taken after fasting for at least 8 hours.    Blood Alcohol level:  Lab Results  Component Value Date   ETH <10 12/24/2022   ETH <5 12/06/2015    Metabolic Disorder Labs: Lab Results  Component Value Date   HGBA1C 6.0 (H) 12/07/2015   MPG 126 12/07/2015   Lab Results  Component Value Date   PROLACTIN 14.1 12/07/2015   Lab Results  Component Value Date   CHOL 321 (H) 12/07/2015   TRIG 71 12/07/2015   HDL 41 12/07/2015   CHOLHDL 7.8 12/07/2015   VLDL 14 12/07/2015   LDLCALC 266 (H) 12/07/2015    Physical Findings: AIMS:  , ,  ,  ,    CIWA:    COWS:     Musculoskeletal: Strength & Muscle Tone: within normal limits Gait & Station: normal Patient leans: N/A  Psychiatric Specialty Exam:  Presentation  General Appearance:  Neat  Eye Contact: Good  Speech: Clear and Coherent; Normal Rate  Speech Volume: Normal  Handedness: Right   Mood and Affect  Mood: Anxious; Irritable  Affect: Appropriate   Thought Process  Thought Processes: Coherent  Descriptions of Associations:Circumstantial  Orientation:Full (Time, Place and Person) (and situation)  Thought Content:WDL  History of Schizophrenia/Schizoaffective disorder:No  Duration of Psychotic Symptoms:none recorded Hallucinations:Hallucinations: None  Ideas of Reference:None  Suicidal Thoughts:Suicidal Thoughts: No  Homicidal Thoughts:Homicidal Thoughts: No   Sensorium  Memory: Immediate Good; Remote Good  Judgment: Fair  Insight: Poor   Executive  Functions  Concentration: Good  Attention Span: Good  Recall: Good  Fund of Knowledge: Good  Language: Good   Psychomotor Activity  Psychomotor Activity: Psychomotor Activity: Normal   Assets  Assets: Communication Skills; Financial Resources/Insurance; Housing; Social Support   Sleep  Sleep: Sleep: Good Number of Hours of Sleep: 8    Physical Exam: Physical Exam Vitals and nursing note reviewed.  Constitutional:      Appearance: Normal appearance.  HENT:     Head: Normocephalic and atraumatic.     Nose: Nose normal.  Pulmonary:     Effort: Pulmonary effort is normal.  Musculoskeletal:        General: Normal range of motion.     Cervical back: Normal range of motion.  Neurological:     General: No focal deficit present.     Mental Status: She is alert and oriented to person, place, and time. Mental status is at baseline.  Psychiatric:        Attention and Perception: Attention and perception normal.        Mood and Affect: Mood is anxious. Affect is flat.        Speech: Speech normal.        Behavior: Behavior normal. Behavior is cooperative.        Thought Content: Thought content normal.        Cognition and Memory: Cognition and memory normal.        Judgment: Judgment normal.   Review of Systems  Psychiatric/Behavioral:  The patient is nervous/anxious.   All other systems reviewed and are negative.  Blood pressure 129/61, pulse 97, temperature 98.2 F (36.8 C), resp. rate 17, height 5\' 8"  (1.727 m), weight 122 kg, last menstrual period 12/08/2022, SpO2 98%. Body mass index is 40.9 kg/m.   Treatment Plan Summary: Daily contact with patient to assess and evaluate symptoms and progress in treatment and Medication management Educate the patient on the importance of treatment and the  expectations of the inpatient setting, including structured therapy and medication options. Educate the patient on the importance of treatment, focusing on the role  of non-pharmacologic interventions if medication is not an option. Implement milieu therapy to provide a structured and supportive environment. Utilize crisis-oriented therapy to address immediate stressors and improve emotional regulation. Monitor blood glucose levels and provide education about the risks of uncontrolled diabetes Novolog 5 units nightly as prescribed for blood glucose control. Continue monitoring BG levels with bedside glucose checks to ensure effective glycemic management. Myriam Forehand, NP 12/26/2022, 6:36 PM

## 2022-12-26 NOTE — Plan of Care (Signed)
  Problem: Education: Goal: Knowledge of [facility] Education information/materials will improve Outcome: Progressing Goal: Emotional status will improve Outcome: Progressing Goal: Mental status will improve Outcome: Progressing Goal: Verbalization of understanding the information provided will improve Outcome: Progressing   Problem: Activity: Goal: Interest or engagement in activities will improve Outcome: Progressing Goal: Sleeping patterns will improve Outcome: Progressing   Problem: Coping: Goal: Ability to verbalize frustrations and anger appropriately will improve Outcome: Progressing Goal: Ability to demonstrate self-control will improve Outcome: Progressing   Problem: Health Behavior/Discharge Planning: Goal: Identification of resources available to assist in meeting health care needs will improve Outcome: Progressing Goal: Compliance with treatment plan for underlying cause of condition will improve Outcome: Progressing   Problem: Physical Regulation: Goal: Ability to maintain clinical measurements within normal limits will improve Outcome: Progressing   Problem: Safety: Goal: Periods of time without injury will increase Outcome: Progressing   Problem: Education: Goal: Knowledge of [facility] Education information/materials will improve Outcome: Progressing Goal: Emotional status will improve Outcome: Progressing Goal: Mental status will improve Outcome: Progressing Goal: Verbalization of understanding the information provided will improve Outcome: Progressing   Problem: Activity: Goal: Interest or engagement in activities will improve Outcome: Progressing Goal: Sleeping patterns will improve Outcome: Progressing   Problem: Coping: Goal: Ability to verbalize frustrations and anger appropriately will improve Outcome: Progressing Goal: Ability to demonstrate self-control will improve Outcome: Progressing   Problem: Health  Behavior/Discharge Planning: Goal: Identification of resources available to assist in meeting health care needs will improve Outcome: Progressing Goal: Compliance with treatment plan for underlying cause of condition will improve Outcome: Progressing   Problem: Physical Regulation: Goal: Ability to maintain clinical measurements within normal limits will improve Outcome: Progressing   Problem: Safety: Goal: Periods of time without injury will increase Outcome: Progressing   Problem: Education: Goal: Ability to make informed decisions regarding treatment will improve Outcome: Progressing   Problem: Coping: Goal: Coping ability will improve Outcome: Progressing   Problem: Health Behavior/Discharge Planning: Goal: Identification of resources available to assist in meeting health care needs will improve Outcome: Progressing   Problem: Medication: Goal: Compliance with prescribed medication regimen will improve Outcome: Progressing   Problem: Self-Concept: Goal: Ability to disclose and discuss suicidal ideas will improve Outcome: Progressing   Problem: Education: Goal: Utilization of techniques to improve thought processes will improve Outcome: Progressing Goal: Knowledge of the prescribed therapeutic regimen will improve Outcome: Progressing   Problem: Activity: Goal: Interest or engagement in leisure activities will improve Outcome: Progressing Goal: Imbalance in normal sleep/wake cycle will improve Outcome: Progressing   Problem: Coping: Goal: Coping ability will improve Outcome: Progressing Goal: Will verbalize feelings Outcome: Progressing   Problem: Health Behavior/Discharge Planning: Goal: Ability to make decisions will improve Outcome: Progressing Goal: Compliance with therapeutic regimen will improve Outcome: Progressing   Problem: Role Relationship: Goal: Will demonstrate positive changes in social behaviors and relationships Outcome: Progressing    Problem: Safety: Goal: Ability to disclose and discuss suicidal ideas will improve Outcome: Progressing Goal: Ability to identify and utilize support systems that promote safety will improve Outcome: Progressing   Problem: Self-Concept: Goal: Will verbalize positive feelings about self Outcome: Progressing Goal: Level of anxiety will decrease Outcome: Progressing

## 2022-12-26 NOTE — Plan of Care (Signed)
Problem: Education: Goal: Emotional status will improve Outcome: Progressing   Problem: Education: Goal: Mental status will improve Outcome: Progressing   Problem: Coping: Goal: Ability to verbalize frustrations and anger appropriately will improve Outcome: Progressing

## 2022-12-26 NOTE — Group Note (Signed)
Date:  12/26/2022 Time:  7:08 PM  Group Topic/Focus:  Activity Group:  The focus of the group is to promote activity with the patients to encourage them to go outside to the courtyard to get some fresh air and some exercise.     Participation Level:  Did Not Attend   Shari [name] 12/26/2022, 7:08 PM

## 2022-12-26 NOTE — Group Note (Signed)
Date:  12/26/2022 Time:  10:21 AM  Group Topic/Focus:  Goals Group:   The focus of this group is to help patients establish daily goals to achieve during treatment and discuss how the patient can incorporate goal setting into their daily lives to aide in recovery.    Participation Level:  Active  Participation Quality:  Appropriate  Affect:  Appropriate  Cognitive:  Appropriate  Insight: Appropriate  Engagement in Group:  Engaged  Modes of Intervention:  Discussion, Education, and Support  Additional Comments:    Wilford Corner 12/26/2022, 10:21 AM

## 2022-12-26 NOTE — Progress Notes (Signed)
Nursing Shift Note:  1900-0700  Attended Evening Group: Yes Medication Compliant:  Yes Behavior: anxious; sad; cooperative Sleep Quality: fair Significant Changes:  none.   Denies SI Hi AVH.  Continued monitoring for safety.     12/26/22 0000  Psych Admission Type (Psych Patients Only)  Admission Status Involuntary  Psychosocial Assessment  Patient Complaints Anxiety  Eye Contact Fair  Facial Expression Anxious  Affect Sad  Speech Soft  Interaction Guarded  Motor Activity Slow  Appearance/Hygiene Unremarkable  Behavior Characteristics Cooperative  Mood Preoccupied  Thought Process  Coherency WDL  Content Blaming others  Delusions None reported or observed  Perception WDL  Hallucination None reported or observed  Judgment Limited  Confusion None  Danger to Self  Current suicidal ideation? Denies  Danger to Others  Danger to Others None reported or observed

## 2022-12-26 NOTE — Progress Notes (Addendum)
Pt is alert and oriented X4. Affect is bright/mood is congruent. Denies anxiety/depression as well as SI/HI/AVH. No pain at this time.   Scheduled medications administered to patient per MD orders. Support and encouragement provided. Routine safety checks conducted every 15 minutes without incident. Patient informed to notify staff with problems or concerns and verbalizes understanding.  No adverse drug noted. Pt is compliant with medications and treatment plan. Pt receptive, calm, cooperative and interacts well with others on the unit.Pt contracts for safety and remains safe on the unit vat this time.

## 2022-12-27 DIAGNOSIS — R4589 Other symptoms and signs involving emotional state: Secondary | ICD-10-CM | POA: Diagnosis not present

## 2022-12-27 LAB — GLUCOSE, CAPILLARY
Glucose-Capillary: 126 mg/dL — ABNORMAL HIGH (ref 70–99)
Glucose-Capillary: 115 mg/dL — ABNORMAL HIGH (ref 70–99)
Glucose-Capillary: 116 mg/dL — ABNORMAL HIGH (ref 70–99)
Glucose-Capillary: 107 mg/dL — ABNORMAL HIGH (ref 70–99)

## 2022-12-27 MED ORDER — ALBUTEROL SULFATE HFA 108 (90 BASE) MCG/ACT IN AERS
2.0000 | INHALATION_SPRAY | RESPIRATORY_TRACT | Status: DC | PRN
  Administered 2022-12-28: 2 via RESPIRATORY_TRACT

## 2022-12-27 MED ORDER — PSEUDOEPHEDRINE HCL ER 120 MG PO TB12: 120.0000 mg | ORAL_TABLET | Freq: Two times a day (BID) | ORAL | Status: DC | PRN

## 2022-12-27 MED ORDER — IPRATROPIUM BROMIDE HFA 17 MCG/ACT IN AERS
2.0000 | INHALATION_SPRAY | Freq: Four times a day (QID) | RESPIRATORY_TRACT | Status: DC
  Administered 2022-12-27 – 2022-12-28 (×4): 2 via RESPIRATORY_TRACT
  Filled 2022-12-27: qty 12.9

## 2022-12-27 MED ORDER — FLUTICASONE PROPIONATE 50 MCG/ACT NA SUSP
2.0000 | Freq: Every day | NASAL | Status: DC
  Administered 2022-12-27 – 2022-12-28 (×2): 2 via NASAL
  Filled 2022-12-27: qty 16

## 2022-12-27 MED ORDER — DEXTROMETHORPHAN POLISTIREX ER 30 MG/5ML PO SUER: 15.0000 mg | Freq: Every evening | ORAL | Status: DC | PRN

## 2022-12-27 NOTE — [facility] Counselor (Signed)
CSW spoke with the patient to follow up on provider report that patient provided permission for collateral contact.  Patient asked what the contact would look like and CSW explained that conversation would focus on pt's acces to weapons, reason for pt admission, assessing if collateral thought pt was a danger to self or others and answering any questions asked with reason.  Pt stated that she did not see a purpose in the conversation as the collateral in question, her sister, would not know answer to some of the questions.  She reported that she is not a danger to self or others and there are no weapons at home.  However, pt declined to provide collateral consent.  Conversation terminated without incident.  Penni Homans, MSW, LCSW 12/27/2022 6:58 PM

## 2022-12-27 NOTE — Group Note (Signed)
[facility] LCSW Group Therapy Note   Group Date: 12/27/2022 Start Time: 1300 End Time: 1400   Type of Therapy/Topic:  Group Therapy:  Emotion Regulation  Participation Level:  Did Not Attend   Mood:  Description of Group:    The purpose of this group is to assist patients in learning to regulate negative emotions and experience positive emotions. Patients will be guided to discuss ways in which they have been vulnerable to their negative emotions. These vulnerabilities will be juxtaposed with experiences of positive emotions or situations, and patients challenged to use positive emotions to combat negative ones. Special emphasis will be placed on coping with negative emotions in conflict situations, and patients will process healthy conflict resolution skills.  Therapeutic Goals: Patient will identify two positive emotions or experiences to reflect on in order to balance out negative emotions:  Patient will label two or more emotions that they find the most difficult to experience:  Patient will be able to demonstrate positive conflict resolution skills through discussion or role plays:   Summary of Patient Progress: Patient did not attend group.   Therapeutic Modalities:   Cognitive Behavioral Therapy Feelings Identification Dialectical Behavioral Therapy   Lowry Ram, LCSW

## 2022-12-27 NOTE — Progress Notes (Signed)
   12/27/22 1701  Psych Admission Type (Psych Patients Only)  Admission Status Involuntary  Psychosocial Assessment  Patient Complaints None  Eye Contact Brief  Facial Expression Flat  Affect Flat  Speech Logical/coherent  Interaction Minimal  Motor Activity Slow  Appearance/Hygiene In hospital gown  Behavior Characteristics Cooperative  Mood Sad  Thought Process  Coherency WDL  Content WDL  Delusions None reported or observed  Perception WDL  Hallucination None reported or observed  Judgment WDL  Confusion None  Danger to Self  Current suicidal ideation? Denies  Danger to Others  Danger to Others None reported or observed   No hyper/hypoglycemic symptoms noted. Denies SI/HI/AVH. Attended group.

## 2022-12-27 NOTE — Plan of Care (Signed)
  Problem: Education: Goal: Knowledge of [facility] Education information/materials will improve Outcome: Progressing Goal: Emotional status will improve Outcome: Progressing Goal: Mental status will improve Outcome: Progressing Goal: Verbalization of understanding the information provided will improve Outcome: Progressing   Problem: Activity: Goal: Interest or engagement in activities will improve Outcome: Progressing Goal: Sleeping patterns will improve Outcome: Progressing   Problem: Coping: Goal: Ability to verbalize frustrations and anger appropriately will improve Outcome: Progressing Goal: Ability to demonstrate self-control will improve Outcome: Progressing   Problem: Health Behavior/Discharge Planning: Goal: Identification of resources available to assist in meeting health care needs will improve Outcome: Progressing Goal: Compliance with treatment plan for underlying cause of condition will improve Outcome: Progressing   Problem: Physical Regulation: Goal: Ability to maintain clinical measurements within normal limits will improve Outcome: Progressing   Problem: Safety: Goal: Periods of time without injury will increase Outcome: Progressing   Problem: Education: Goal: Knowledge of [facility] Education information/materials will improve Outcome: Progressing Goal: Emotional status will improve Outcome: Progressing Goal: Mental status will improve Outcome: Progressing Goal: Verbalization of understanding the information provided will improve Outcome: Progressing   Problem: Activity: Goal: Interest or engagement in activities will improve Outcome: Progressing Goal: Sleeping patterns will improve Outcome: Progressing   Problem: Coping: Goal: Ability to verbalize frustrations and anger appropriately will improve Outcome: Progressing Goal: Ability to demonstrate self-control will improve Outcome: Progressing   Problem: Health  Behavior/Discharge Planning: Goal: Identification of resources available to assist in meeting health care needs will improve Outcome: Progressing Goal: Compliance with treatment plan for underlying cause of condition will improve Outcome: Progressing   Problem: Physical Regulation: Goal: Ability to maintain clinical measurements within normal limits will improve Outcome: Progressing   Problem: Safety: Goal: Periods of time without injury will increase Outcome: Progressing   Problem: Education: Goal: Ability to make informed decisions regarding treatment will improve Outcome: Progressing   Problem: Coping: Goal: Coping ability will improve Outcome: Progressing   Problem: Health Behavior/Discharge Planning: Goal: Identification of resources available to assist in meeting health care needs will improve Outcome: Progressing   Problem: Medication: Goal: Compliance with prescribed medication regimen will improve Outcome: Progressing   Problem: Self-Concept: Goal: Ability to disclose and discuss suicidal ideas will improve Outcome: Progressing   Problem: Education: Goal: Utilization of techniques to improve thought processes will improve Outcome: Progressing Goal: Knowledge of the prescribed therapeutic regimen will improve Outcome: Progressing   Problem: Activity: Goal: Interest or engagement in leisure activities will improve Outcome: Progressing Goal: Imbalance in normal sleep/wake cycle will improve Outcome: Progressing   Problem: Coping: Goal: Coping ability will improve Outcome: Progressing Goal: Will verbalize feelings Outcome: Progressing   Problem: Health Behavior/Discharge Planning: Goal: Ability to make decisions will improve Outcome: Progressing Goal: Compliance with therapeutic regimen will improve Outcome: Progressing   Problem: Role Relationship: Goal: Will demonstrate positive changes in social behaviors and relationships Outcome: Progressing    Problem: Safety: Goal: Ability to disclose and discuss suicidal ideas will improve Outcome: Progressing Goal: Ability to identify and utilize support systems that promote safety will improve Outcome: Progressing   Problem: Self-Concept: Goal: Will verbalize positive feelings about self Outcome: Progressing Goal: Level of anxiety will decrease Outcome: Progressing

## 2022-12-27 NOTE — Group Note (Signed)
Date:  12/27/2022 Time:  5:30 PM  Group Topic/Focus:  Activity Group:  The focus of the group is to promote activity for the patients and encourage them to go outside in the courtyard and get some fresh air and some exercise.    Participation Level:  Did Not Attend   Mary Sella Candia Kingsbury 12/27/2022, 5:30 PM

## 2022-12-27 NOTE — Progress Notes (Signed)
   12/27/22 1610  BH Safety Checks  Location Hallway  Visual Appearance Calm  Behavior Sleeping

## 2022-12-27 NOTE — Group Note (Signed)
Date:  12/27/2022 Time:  5:26 PM  Group Topic/Focus:  Crisis Planning:   The purpose of this group is to help patients create a crisis plan for use upon discharge or in the future, as needed.    Participation Level:  Active  Participation Quality:  Appropriate  Affect:  Appropriate  Cognitive:  Appropriate  Insight: Appropriate  Engagement in Group:  Engaged  Modes of Intervention:  Activity  Additional Comments:    Shari [name] 12/27/2022, 5:26 PM

## 2022-12-27 NOTE — Progress Notes (Signed)
[facility] MD Progress Note  12/27/2022 2:29 PM Shari [name]  MRN:  [id number] Subjective:   27 year old African American female, states, "I have made my appointments, I am ready to go home." She denies suicidal ideation (SI), homicidal ideation (HI), delusions, or hallucinations. The patient reports experiencing cold symptoms and uses an inhaler as part of her treatment plan.The patient is clinically stable and ready for discharge with a plan for follow-up appointments and a safe discharge plan in pla Principal Problem: Suicidal behavior without attempted self-injury Diagnosis: Principal Problem:   Suicidal behavior without attempted self-injury  Total Time spent with patient: 1 hour  Past Psychiatric History: Depression Anxiety Suicidal Behavior     Past Medical History:  Past Medical History:  Diagnosis Date   Anxiety    Asthma    well controlled   Diabetes mellitus without complication (HCC)    pt states she went off her meds because her numbers were fine   Endometriosis    Hidradenitis suppurativa 12/31 /2019   Pneumonia 01/2017   Refusal of blood product     Past Surgical History:  Procedure Laterality Date   ADENOIDECTOMY Bilateral 02/09/2018   Procedure: REVISION ADENOIDECTOMY;  Surgeon: Bud Face, MD;  Location: [facility] ORS;  Service: ENT;  Laterality: Bilateral;   EYE SURGERY     MYRINGOTOMY WITH TUBE PLACEMENT Right 02/09/2018   Procedure: MYRINGOTOMY WITH TUBE PLACEMENT;  Surgeon: Bud Face, MD;  Location: [facility] ORS;  Service: ENT;  Laterality: Right;   NASOPHARYNGOSCOPY EUSTATION TUBE BALLOON DILATION Bilateral 02/09/2018   Procedure: NASOPHARYNGOSCOPY EUSTATION TUBE BALLOON DILATION;  Surgeon: Bud Face, MD;  Location: [facility] ORS;  Service: ENT;  Laterality: Bilateral;   TONSILLECTOMY     TURBINATE REDUCTION Right 02/09/2018   Procedure: OUTFRACTURE RIGHT INFERIOR TURBINATE;  Surgeon: Bud Face, MD;  Location: [facility] ORS;  Service: ENT;  Laterality:  Right;   Family History:  Family History  Problem Relation Age of Onset   Anxiety disorder Mother    Depression Mother    Alcohol abuse Father    Drug abuse Father    Schizophrenia Maternal Uncle    Family Psychiatric  History: see above Social History:  Social History   Substance and Sexual Activity  Alcohol Use Yes   Alcohol/week: 2.0 standard drinks of alcohol   Types: 2 Glasses of wine per week     Social History   Substance and Sexual Activity  Drug Use Not Currently   Types: Marijuana   Comment: last use 08/19/21    Social History   Socioeconomic History   Marital status: Single    Spouse name: Not on file   Number of children: 0   Years of education: Not on file   Highest education level: Associate degree: occupational, Scientist, product/process development, or vocational program  Occupational History   Not on file  Tobacco Use   Smoking status: Never   Smokeless tobacco: Never  Vaping Use   Vaping status: Never Used  Substance and Sexual Activity   Alcohol use: Yes    Alcohol/week: 2.0 standard drinks of alcohol    Types: 2 Glasses of wine per week   Drug use: Not Currently    Types: Marijuana    Comment: last use 08/19/21   Sexual activity: Yes    Partners: Male    Birth control/protection: None  Other Topics Concern   Not on file  Social History Narrative   Not on file   Social Determinants of Health   Financial Resource Strain: Low  Risk  (09/02/2022)   [facility]   Overall Financial Resource Strain (CARDIA)    Difficulty of Paying Living Expenses: Not hard at all  Food Insecurity: No Food Insecurity (12/25/2022)   Hunger Vital Sign    Worried About Running Out of Food in the Last Year: Never true    Ran Out of Food in the Last Year: Never true  Transportation Needs: No Transportation Needs (12/25/2022)   PRAPARE - Administrator, Civil Service (Medical): No    Lack of Transportation (Non-Medical): No  Physical Activity: Unknown  (02/19/2017)   Exercise Vital Sign    Days of Exercise per Week: 0 days    Minutes of Exercise per Session: Not asked  Stress: Stress Concern Present (02/19/2017)   [employer] of Occupational Health - Occupational Stress Questionnaire    Feeling of Stress : Rather much  Social Connections: Unknown (06/12/2021)   Received from [facility]   Social Network    Social Network: Not on file   Additional Social History:                         Sleep: Good  Appetite:  Good  Current Medications: Current Facility-Administered Medications  Medication Dose Route Frequency Provider Last Rate Last Admin   acetaminophen (TYLENOL) tablet 650 mg  650 mg Oral Q6H PRN Foust, Katy L, NP       albuterol (VENTOLIN HFA) 108 (90 Base) MCG/ACT inhaler 2 puff  2 puff Inhalation Q4H PRN Myriam Forehand, NP       alum & mag hydroxide-simeth (MAALOX/MYLANTA) 200-200-20 MG/5ML suspension 30 mL  30 mL Oral Q4H PRN Foust, Katy L, NP       dextromethorphan (DELSYM) 30 MG/5ML liquid 15 mg  15 mg Oral QHS PRN Myriam Forehand, NP       fluticasone (FLONASE) 50 MCG/ACT nasal spray 2 spray  2 spray Each Nare Daily Myriam Forehand, NP       hydrOXYzine (ATARAX) tablet 25 mg  25 mg Oral TID PRN Bishop Limbo L, NP       insulin glargine-yfgn (SEMGLEE) injection 5 Units  5 Units Subcutaneous QHS Myriam Forehand, NP   5 Units at 12/26/22 2127   ipratropium (ATROVENT HFA) inhaler 2 puff  2 puff Inhalation Q6H Myriam Forehand, NP       magnesium hydroxide (MILK OF MAGNESIA) suspension 30 mL  30 mL Oral Daily PRN Foust, Katy L, NP       pseudoephedrine (SUDAFED) 12 hr tablet 120 mg  120 mg Oral Q12H PRN Myriam Forehand, NP       traZODone (DESYREL) tablet 50 mg  50 mg Oral QHS PRN Foust, Katy L, NP        Lab Results:  Results for orders placed or performed during the hospital encounter of 12/25/22 (from the past 48 hour(s))  Glucose, capillary     Status: Abnormal   Collection Time: 12/25/22  4:31 PM   Result Value Ref Range   Glucose-Capillary 108 (H) 70 - 99 mg/dL    Comment: Glucose reference range applies only to samples taken after fasting for at least 8 hours.   Comment 1 Notify RN   Glucose, capillary     Status: Abnormal   Collection Time: 12/25/22  9:41 PM  Result Value Ref Range   Glucose-Capillary 160 (H) 70 - 99 mg/dL    Comment:  Glucose reference range applies only to samples taken after fasting for at least 8 hours.  Glucose, capillary     Status: Abnormal   Collection Time: 12/26/22  8:08 AM  Result Value Ref Range   Glucose-Capillary 123 (H) 70 - 99 mg/dL    Comment: Glucose reference range applies only to samples taken after fasting for at least 8 hours.  Glucose, capillary     Status: Abnormal   Collection Time: 12/26/22  4:34 PM  Result Value Ref Range   Glucose-Capillary 114 (H) 70 - 99 mg/dL    Comment: Glucose reference range applies only to samples taken after fasting for at least 8 hours.  Glucose, capillary     Status: Abnormal   Collection Time: 12/26/22  9:25 PM  Result Value Ref Range   Glucose-Capillary 118 (H) 70 - 99 mg/dL    Comment: Glucose reference range applies only to samples taken after fasting for at least 8 hours.  Glucose, capillary     Status: Abnormal   Collection Time: 12/27/22  8:01 AM  Result Value Ref Range   Glucose-Capillary 126 (H) 70 - 99 mg/dL    Comment: Glucose reference range applies only to samples taken after fasting for at least 8 hours.  Glucose, capillary     Status: Abnormal   Collection Time: 12/27/22 12:03 PM  Result Value Ref Range   Glucose-Capillary 115 (H) 70 - 99 mg/dL    Comment: Glucose reference range applies only to samples taken after fasting for at least 8 hours.    Blood Alcohol level:  Lab Results  Component Value Date   ETH <10 12/24/2022   ETH <5 12/06/2015    Metabolic Disorder Labs: Lab Results  Component Value Date   HGBA1C 6.0 (H) 12/07/2015   MPG 126 12/07/2015   Lab Results   Component Value Date   PROLACTIN 14.1 12/07/2015   Lab Results  Component Value Date   CHOL 321 (H) 12/07/2015   TRIG 71 12/07/2015   HDL 41 12/07/2015   CHOLHDL 7.8 12/07/2015   VLDL 14 12/07/2015   LDLCALC 266 (H) 12/07/2015    Physical Findings: AIMS:  , ,  ,  ,    CIWA:    COWS:     Musculoskeletal: Strength & Muscle Tone: within normal limits Gait & Station: normal Patient leans: N/A  Psychiatric Specialty Exam:  Presentation  General Appearance:  Neat  Eye Contact: Good  Speech: Clear and Coherent; Normal Rate  Speech Volume: Normal  Handedness: Right   Mood and Affect  Mood: Anxious; Irritable  Affect: Appropriate   Thought Process  Thought Processes: Coherent  Descriptions of Associations:Circumstantial  Orientation:Full (Time, Place and Person) (and situation)  Thought Content:WDL  History of Schizophrenia/Schizoaffective disorder:No  Duration of Psychotic Symptoms:none recorded Hallucinations:Hallucinations: None  Ideas of Reference:None  Suicidal Thoughts:Suicidal Thoughts: No  Homicidal Thoughts:Homicidal Thoughts: No   Sensorium  Memory: Immediate Good; Remote Good  Judgment: Fair  Insight: Poor   Executive Functions  Concentration: Good  Attention Span: Good  Recall: Good  Fund of Knowledge: Good  Language: Good   Psychomotor Activity  Psychomotor Activity: Psychomotor Activity: Normal   Assets  Assets: Communication Skills; Financial Resources/Insurance; Housing; Social Support   Sleep  Sleep: Sleep: Good Number of Hours of Sleep: 8    Physical Exam: Physical Exam Vitals and nursing note reviewed.  Constitutional:      Appearance: Normal appearance.  HENT:     Head: Normocephalic and atraumatic.  Nose: Nose normal.  Pulmonary:     Effort: Pulmonary effort is normal.  Musculoskeletal:        General: Normal range of motion.     Cervical back: Normal range of motion.   Neurological:     General: No focal deficit present.     Mental Status: She is alert and oriented to person, place, and time. Mental status is at baseline.  Psychiatric:        Attention and Perception: Attention and perception normal.        Mood and Affect: Mood is anxious. Affect is flat.        Speech: Speech normal.        Behavior: Behavior normal. Behavior is cooperative.        Thought Content: Thought content normal.        Cognition and Memory: Cognition and memory normal.        Judgment: Judgment normal.    Review of Systems  Psychiatric/Behavioral:  The patient is nervous/anxious.   All other systems reviewed and are negative.  Blood pressure 127/76, pulse 96, temperature 98.1 F (36.7 C), resp. rate 17, height 5\' 8"  (1.727 m), weight 122 kg, last menstrual period 12/08/2022, SpO2 98%. Body mass index is 40.9 kg/m.   Treatment Plan Summary: Daily contact with patient to assess and evaluate symptoms and progress in treatment and Medication management Dextromethorphan Cough Syrup 15 mL as needed for cold symptoms. Flonase: Continue for nasal congestion. Atrovent Inhaler: Continue as prescribed for respiratory management. Albuterol Inhaler: Continue as prescribed for acute bronchospasm relief. [place] 5 units at bedtime: Continue for blood glucose management. Confirm follow-up appointments with primary care and behavioral health providers. Myriam Forehand, NP 12/27/2022, 2:29 PM

## 2022-12-27 NOTE — Inpatient Diabetes Management (Signed)
Inpatient Diabetes Program Recommendations  AACE/ADA: New Consensus Statement on Inpatient Glycemic Control   Target Ranges:  Prepandial:   less than 140 mg/dL      Peak postprandial:   less than 180 mg/dL (1-2 hours)      Critically ill patients:  140 - 180 mg/dL    Latest Reference Range & Units 12/25/22 16:31 12/25/22 21:41 12/26/22 08:08 12/26/22 16:34 12/26/22 21:25  Glucose-Capillary 70 - 99 mg/dL 578 (H) 469 (H) 629 (H) 114 (H) 118 (H)    Review of Glycemic Control  Current orders for Inpatient glycemic control: Semglee 5 units QHS  NOTE: Patient ordered Semglee 5 units at bedtime and glucose 114-123 mg/dl on 52/84/13. No recommendations for changes currently since CBGs trending well. Will sign off consult. Please reconsult diabetes coordinator if needed.  Thanks, Orlando Penner, RN, MSN, CDCES Diabetes Coordinator Inpatient Diabetes Program [phone] (Team Pager from 8am to 5pm)

## 2022-12-28 DIAGNOSIS — R4589 Other symptoms and signs involving emotional state: Secondary | ICD-10-CM | POA: Diagnosis not present

## 2022-12-28 LAB — GLUCOSE, CAPILLARY
Glucose-Capillary: 115 mg/dL — ABNORMAL HIGH (ref 70–99)
Glucose-Capillary: 132 mg/dL — ABNORMAL HIGH (ref 70–99)

## 2022-12-28 MED ORDER — INSULIN GLARGINE-YFGN 100 UNIT/ML ~~LOC~~ SOLN: 5.0000 [IU] | Freq: Every day | SUBCUTANEOUS | 0 refills | Status: AC

## 2022-12-28 MED ORDER — DEXTROMETHORPHAN POLISTIREX ER 30 MG/5ML PO SUER: 15.0000 mg | Freq: Every evening | ORAL | 0 refills | Status: AC | PRN

## 2022-12-28 MED ORDER — HYDROXYZINE HCL 25 MG PO TABS: 25.0000 mg | ORAL_TABLET | Freq: Three times a day (TID) | ORAL | 0 refills | Status: AC | PRN

## 2022-12-28 MED ORDER — TRAZODONE HCL 50 MG PO TABS: 50.0000 mg | ORAL_TABLET | Freq: Every evening | ORAL | 0 refills | Status: AC | PRN

## 2022-12-28 MED ORDER — FLUTICASONE PROPIONATE 50 MCG/ACT NA SUSP: 2.0000 | Freq: Every day | NASAL | 0 refills | Status: AC

## 2022-12-28 MED ORDER — PSEUDOEPHEDRINE HCL ER 120 MG PO TB12: 120.0000 mg | ORAL_TABLET | Freq: Two times a day (BID) | ORAL | 0 refills | Status: AC | PRN

## 2022-12-28 MED ORDER — ALBUTEROL SULFATE HFA 108 (90 BASE) MCG/ACT IN AERS: 2.0000 | INHALATION_SPRAY | RESPIRATORY_TRACT | 0 refills | Status: AC | PRN

## 2022-12-28 MED ORDER — IPRATROPIUM BROMIDE HFA 17 MCG/ACT IN AERS: 2.0000 | INHALATION_SPRAY | Freq: Four times a day (QID) | RESPIRATORY_TRACT | 12 refills | Status: AC

## 2022-12-28 NOTE — Plan of Care (Signed)
  Problem: Education: Goal: Knowledge of [place] [facility] Education information/materials will improve Outcome: Adequate for Discharge Goal: Emotional status will improve Outcome: Adequate for Discharge Goal: Mental status will improve Outcome: Adequate for Discharge Goal: Verbalization of understanding the information provided will improve Outcome: Adequate for Discharge   Problem: Activity: Goal: Interest or engagement in activities will improve Outcome: Adequate for Discharge Goal: Sleeping patterns will improve Outcome: Adequate for Discharge   Problem: Coping: Goal: Ability to verbalize frustrations and anger appropriately will improve Outcome: Adequate for Discharge Goal: Ability to demonstrate self-control will improve Outcome: Adequate for Discharge   Problem: Health Behavior/Discharge Planning: Goal: Identification of resources available to assist in meeting health care needs will improve Outcome: Adequate for Discharge Goal: Compliance with treatment plan for underlying cause of condition will improve Outcome: Adequate for Discharge   Problem: Physical Regulation: Goal: Ability to maintain clinical measurements within normal limits will improve Outcome: Adequate for Discharge   Problem: Safety: Goal: Periods of time without injury will increase Outcome: Adequate for Discharge   Problem: Education: Goal: Knowledge of [place] [facility] Education information/materials will improve Outcome: Adequate for Discharge Goal: Emotional status will improve Outcome: Adequate for Discharge Goal: Mental status will improve Outcome: Adequate for Discharge Goal: Verbalization of understanding the information provided will improve Outcome: Adequate for Discharge

## 2022-12-28 NOTE — Group Note (Signed)
Date:  12/28/2022 Time:  4:20 AM  Group Topic/Focus:  Wrap-Up Group:   The focus of this group is to help patients review their daily goal of treatment and discuss progress on daily workbooks.    Participation Level:  Active  Participation Quality:  Appropriate and Attentive  Affect:  Appropriate  Cognitive:  Alert and Appropriate  Insight: Appropriate  Engagement in Group:  Limited  Modes of Intervention:  Discussion  Additional Comments:     [name],Shari Domagala E 12/28/2022, 4:20 AM

## 2022-12-28 NOTE — Discharge Summary (Signed)
Physician Discharge Summary Note  Patient:  Shari [name] is an 27 y.o., female MRN:  [id number] DOB:  1995/06/21 Patient phone:  [phone] (home)  Patient address:   [address] [facility] [address],  Total Time spent with patient: 2 hours  Date of Admission:  12/25/2022 Date of Discharge: 12/28/2022  Reason for Admission:  27 year old African American female, Primary reason for admission: Suicide attempt and need for stabilization and treatment of underlying psychiatric disorders.with a history of schizophrenia, borderline personality disorder, cannabis use disorder, depression, and anxiety, was admitted under involuntary commitment (IVC) after a suicide attempt. She tied a rope to a cinderblock, drove to a bypass overlooking the [facility], and contemplated using the cinderblock to harm herself. This action was reported by law enforcement, who escorted her to the [facility] ED.The patient cited recent stressors, including a breakup, work-related pressures, premenstrual mood changes, and interpersonal conflicts with neighbors. Despite acknowledging her actions, she minimized the severity of the event and expressed agitation about hospitalization, refusing psychotropic and diabetic medications. Her current presentation includes an anxious mood with a flat affect, and although she denies current suicidal ideation (SI), her recent actions indicate ongoing risk for self-harm.  Primary reason for admission: Suicide attempt and need for stabilization and treatment of underlying psychiatric disorders.  Principal Problem: Suicidal behavior without attempted self-injury Discharge Diagnoses: Principal Problem:   Suicidal behavior without attempted self-injury   Past Psychiatric History: schizophrenia, borderline personality disorder, cannabis use disorder, depression, and anxiety  Past Medical History:  Past Medical History:  Diagnosis Date   Anxiety    Asthma    well  controlled   Diabetes mellitus without complication (HCC)    pt states she went off her meds because her numbers were fine   Endometriosis    Hidradenitis suppurativa 12/31 /2019   Pneumonia 01/2017   Refusal of blood product     Past Surgical History:  Procedure Laterality Date   ADENOIDECTOMY Bilateral 02/09/2018   Procedure: REVISION ADENOIDECTOMY;  Surgeon: Bud Face, MD;  Location: [facility] ORS;  Service: ENT;  Laterality: Bilateral;   EYE SURGERY     MYRINGOTOMY WITH TUBE PLACEMENT Right 02/09/2018   Procedure: MYRINGOTOMY WITH TUBE PLACEMENT;  Surgeon: Bud Face, MD;  Location: [facility] ORS;  Service: ENT;  Laterality: Right;   NASOPHARYNGOSCOPY EUSTATION TUBE BALLOON DILATION Bilateral 02/09/2018   Procedure: NASOPHARYNGOSCOPY EUSTATION TUBE BALLOON DILATION;  Surgeon: Bud Face, MD;  Location: [facility] ORS;  Service: ENT;  Laterality: Bilateral;   TONSILLECTOMY     TURBINATE REDUCTION Right 02/09/2018   Procedure: OUTFRACTURE RIGHT INFERIOR TURBINATE;  Surgeon: Bud Face, MD;  Location: [facility] ORS;  Service: ENT;  Laterality: Right;   Family History:  Family History  Problem Relation Age of Onset   Anxiety disorder Mother    Depression Mother    Alcohol abuse Father    Drug abuse Father    Schizophrenia Maternal Uncle    Family Psychiatric  History: see above Social History:  Social History   Substance and Sexual Activity  Alcohol Use Yes   Alcohol/week: 2.0 standard drinks of alcohol   Types: 2 Glasses of wine per week     Social History   Substance and Sexual Activity  Drug Use Not Currently   Types: Marijuana   Comment: last use 08/19/21    Social History   Socioeconomic History   Marital status: Single    Spouse name: Not on file   Number of children: 0   Years  of education: Not on file   Highest education level: Associate degree: occupational, technical, or vocational program  Occupational History   Not on file  Tobacco Use   Smoking  status: Never   Smokeless tobacco: Never  Vaping Use   Vaping status: Never Used  Substance and Sexual Activity   Alcohol use: Yes    Alcohol/week: 2.0 standard drinks of alcohol    Types: 2 Glasses of wine per week   Drug use: Not Currently    Types: Marijuana    Comment: last use 08/19/21   Sexual activity: Yes    Partners: Male    Birth control/protection: None  Other Topics Concern   Not on file  Social History Narrative   Not on file   Social Determinants of Health   Financial Resource Strain: Low Risk  (09/02/2022)   [facility] from [facility]   Overall Financial Resource Strain (CARDIA)    Difficulty of Paying Living Expenses: Not hard at all  Food Insecurity: No Food Insecurity (12/25/2022)   Hunger Vital Sign    Worried About Running Out of Food in the Last Year: Never true    Ran Out of Food in the Last Year: Never true  Transportation Needs: No Transportation Needs (12/25/2022)   PRAPARE - Administrator, Civil Service (Medical): No    Lack of Transportation (Non-Medical): No  Physical Activity: Unknown (02/19/2017)   Exercise Vital Sign    Days of Exercise per Week: 0 days    Minutes of Exercise per Session: Not asked  Stress: Stress Concern Present (02/19/2017)   [employer] of Occupational Health - Occupational Stress Questionnaire    Feeling of Stress : Rather much  Social Connections: Unknown (06/12/2021)   Received from [facility]   Social Network    Social Network: Not on file    Hospital Course:  The patient was admitted under involuntary commitment (IVC) after a suicide attempt involving a rope tied to a cinderblock, intended to harm herself at a bypass. She was escorted by Patent examiner to [facility] ED. On admission, the patient presented with an anxious mood, flat affect, and reported recent stressors, including a breakup, work-related pressures, and premenstrual mood changes. Despite these circumstances,  she minimized the severity of her actions and initially declined inpatient psychiatric admission.Patient refused consent for staff to obtain collateral from family or friends. Non-compliant with psychotropic and diabetic medications, stating she does not need them.Provided a safe environment to prevent self-harm.Evaluated daily for mood changes and safety concerns. Encouraged engagement in individual therapy sessions, though the patient remained resistant.Patient refused psychotropic medications during admission.Diabetic medications were offered; however, compliance was inconsistent.Discussed the importance of addressing mood dysregulation and coping strategies for stressors.Patient expressed agitation with hospitalization, repeatedly requesting discharge and stating, "I'm fine now."The patient remained free of acute psychotic symptoms throughout her stay.Continued to deny SI/HI or self-harm intentions after initial stabilization.Showed limited engagement in group or therapeutic activities.The patient was discharged with a treatment plan designed to ensure safety and continuity of care. While she remained resistant to treatment, she was cooperative during discharge and agreed to follow up with outpatient care.    Musculoskeletal: Strength & Muscle Tone: within normal limits Gait & Station: normal Patient leans: N/A   Psychiatric Specialty Exam:  Presentation  General Appearance:  Appropriate for Environment; Neat  Eye Contact: Good  Speech: Clear and Coherent  Speech Volume: Normal  Handedness: Right   Mood and Affect  Mood:  Anxious  Affect: Flat   Thought Process  Thought Processes: Coherent  Descriptions of Associations:Intact  Orientation:Full (Time, Place and Person) (and situation)  Thought Content:WDL  History of Schizophrenia/Schizoaffective disorder:No  Duration of Psychotic Symptoms:No data recorded Hallucinations:Hallucinations: None  Ideas of  Reference:None  Suicidal Thoughts:Suicidal Thoughts: No  Homicidal Thoughts:Homicidal Thoughts: No   Sensorium  Memory: Immediate Good; Remote Good  Judgment: Fair  Insight: Poor   Executive Functions  Concentration: Fair  Attention Span: Fair  Recall: Good  Fund of Knowledge: Good  Language: Good   Psychomotor Activity  Psychomotor Activity: Psychomotor Activity: Normal   Assets  Assets: Communication Skills; Desire for Improvement; Financial Resources/Insurance; Housing; Social Support   Sleep  Sleep: Sleep: Good Number of Hours of Sleep: 7    Physical Exam: Physical Exam Vitals and nursing note reviewed.  Constitutional:      Appearance: Normal appearance.  HENT:     Head: Normocephalic and atraumatic.     Nose: Congestion present.  Pulmonary:     Effort: Pulmonary effort is normal.  Musculoskeletal:        General: Normal range of motion.     Cervical back: Normal range of motion.  Neurological:     General: No focal deficit present.     Mental Status: She is alert and oriented to person, place, and time. Mental status is at baseline.    Review of Systems  All other systems reviewed and are negative.  Blood pressure 115/63, pulse 96, temperature 99.1 F (37.3 C), resp. rate 18, height 5\' 8"  (1.727 m), weight 122 kg, last menstrual period 12/08/2022, SpO2 95%. Body mass index is 40.9 kg/m.   Social History   Tobacco Use  Smoking Status Never  Smokeless Tobacco Never   Tobacco Cessation:  N/A, patient does not currently use tobacco products   Blood Alcohol level:  Lab Results  Component Value Date   ETH <10 12/24/2022   ETH <5 12/06/2015    Metabolic Disorder Labs:  Lab Results  Component Value Date   HGBA1C 6.0 (H) 12/07/2015   MPG 126 12/07/2015   Lab Results  Component Value Date   PROLACTIN 14.1 12/07/2015   Lab Results  Component Value Date   CHOL 321 (H) 12/07/2015   TRIG 71 12/07/2015   HDL 41  12/07/2015   CHOLHDL 7.8 12/07/2015   VLDL 14 12/07/2015   LDLCALC 266 (H) 12/07/2015    See Psychiatric Specialty Exam and Suicide Risk Assessment completed by Attending Physician prior to discharge.  Discharge destination:  Home  Is patient on multiple antipsychotic therapies at discharge:  No   Has Patient had three or more failed trials of antipsychotic monotherapy by history:  No  Recommended Plan for Multiple Antipsychotic Therapies: NA   Allergies as of 12/28/2022       Reactions   Sulfa Antibiotics Hives   Tramadol Other (See Comments)   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 depression 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 depression 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 depression        Medication List     STOP taking these medications    Byetta 10 MCG Pen 10 MCG/0.04ML Sopn injection Generic drug: exenatide   Dulaglutide 0.75 MG/0.5ML Soaj   etonogestrel 68 MG Impl implant Commonly known as: NEXPLANON   ezetimibe 10 MG tablet Commonly known as: ZETIA   FeroSul 325 (  65 Fe) MG tablet Generic drug: ferrous sulfate   Fiasp FlexTouch 100 UNIT/ML FlexTouch Pen Generic drug: insulin aspart   Fifty50 Glucose Meter 2.0 w/Device Kit   Fluticasone-Salmeterol 250-50 MCG/DOSE Aepb Commonly known as: ADVAIR   ipratropium-albuterol 0.5-2.5 (3) MG/3ML Soln Commonly known as: DUONEB   Jardiance 25 MG Tabs tablet Generic drug: empagliflozin   ketoconazole 2 % cream Commonly known as: NIZORAL   Lantus SoloStar 100 UNIT/ML Solostar Pen Generic drug: insulin glargine   letrozole 2.5 MG tablet Commonly known as: FEMARA   metFORMIN 500 MG 24 hr tablet Commonly known as: GLUCOPHAGE-XR   metroNIDAZOLE 500 MG tablet Commonly known as: FLAGYL   omeprazole 20 MG capsule Commonly known as: PRILOSEC   Ozempic (0.25 or 0.5 MG/DOSE) 2 MG/1.5ML  Sopn Generic drug: Semaglutide(0.25 or 0.5MG /DOS)   Precision QID Test test strip Generic drug: glucose blood   rosuvastatin 40 MG tablet Commonly known as: CRESTOR   Semglee (yfgn) 100 UNIT/ML Pen Generic drug: insulin glargine-yfgn Replaced by: insulin glargine-yfgn 100 UNIT/ML injection   Tradjenta 5 MG Tabs tablet Generic drug: linagliptin   Tresiba FlexTouch 200 UNIT/ML FlexTouch Pen Generic drug: insulin degludec   TRUEplus Pen Needles 32G X 4 MM Misc Generic drug: Insulin Pen Needle   valACYclovir 1000 MG tablet Commonly known as: VALTREX       TAKE these medications      Indication  albuterol 108 (90 Base) MCG/ACT inhaler Commonly known as: VENTOLIN HFA Inhale 2 puffs into the lungs every 4 (four) hours as needed for up to 30 doses for wheezing or shortness of breath. What changed: when to take this Notes to patient: Last dose today 11/30 at 12:24 AM  Indication: Asthma   dextromethorphan 30 MG/5ML liquid Commonly known as: DELSYM Take 2.5 mLs (15 mg total) by mouth at bedtime as needed for up to 7 days for cough.  Indication: Cough   fluticasone 50 MCG/ACT nasal spray Commonly known as: FLONASE Place 2 sprays into both nostrils daily for 5 doses. Start taking on: December 29, 2022 What changed:  how much to take when to take this Notes to patient: Last dose 11/30 at 8:19 AM  Indication: Stuffy Nose, Nonallergic Rhinitis   hydrOXYzine 25 MG tablet Commonly known as: ATARAX Take 1 tablet (25 mg total) by mouth 3 (three) times daily as needed for anxiety. Notes to patient: Last dose 11/30 at 9:30 PM  Indication: Feeling Tense   insulin glargine-yfgn 100 UNIT/ML injection Commonly known as: SEMGLEE Inject 0.05 mLs (5 Units total) into the skin at bedtime. Replaces: Semglee (yfgn) 100 UNIT/ML Pen Notes to patient: Last Dose 11/29 at 8:56 PM  Indication: High Blood Sugar, Type 1 Diabetes   ipratropium 17 MCG/ACT inhaler Commonly known as: ATROVENT  HFA Inhale 2 puffs into the lungs every 6 (six) hours.  Indication: Asthma, Asthma Exacerbation   pseudoephedrine 120 MG 12 hr tablet Commonly known as: SUDAFED Take 1 tablet (120 mg total) by mouth every 12 (twelve) hours as needed for up to 3 days for congestion.  Indication: Cold Symptoms, Stuffy Nose   traZODone 50 MG tablet Commonly known as: DESYREL Take 1 tablet (50 mg total) by mouth at bedtime as needed for sleep. What changed:  medication strength how much to take  Indication: Trouble Sleeping        Follow-up Information     [facility], [facility]. Go in 1 week(s).   Why: Same-Day Access Hours:  Monday, Wednesday and Friday, 8am -  3pm Crisis & Diversion Center: Walk-In Crisis Hours: 7 days/week, 8am - 12am Contact information: [address] [place] [address] [phone]                 Follow-up recommendations:  Activity:  as tolerated Diet:  heart healthy  Comments:  Fluticasone (Flonase): 50 mcg/ACT nasal spray, 2 sprays each nare daily. Insulin Glargine (Semglee): 5 units subcutaneous QHS. Hydroxyzine (Atarax): 25 mg TID PRN for anxiety. Trazodone (Desyrel): 50 mg QHS PRN for sleep. National Suicide Prevention Lifeline: 1-800-273-TALK 7257821889) Crisis Text Line: Text HOME to 334-007-4207 Follow-up with Walk-In Crisis Hours: 7 days/week, 8am - 12am Contact information: [address] [place] [address] [phone]  Foutpatient care within 7 days.for PCP diabetes management   Signed: Myriam Forehand, NP 12/28/2022, 12:23 PM

## 2022-12-28 NOTE — Progress Notes (Signed)
  [facility] Adult Case Management Discharge Plan :  Will you be returning to the same living situation after discharge:  Yes,  Pt returning home with relatives. At discharge, do you have transportation home?: Yes,  CSW will assist with transportation. Do you have the ability to pay for your medications: Yes,  Pt able to afford medications.  Release of information consent forms completed and in the chart;  Patient's signature needed at discharge.  Patient to Follow up at:  Follow-up Information     [facility], [facility]. Go in 1 week(s).   Why: Same-Day Access Hours:  Monday, Wednesday and Friday, 8am - 3pm Crisis & Diversion Center: Walk-In Crisis Hours: 7 days/week, 8am - 12am Contact information: [address] [place] [address] [phone]                 Next level of care provider has access to [facility] Link:yes  Safety Planning and Suicide Prevention discussed: Yes,  Pt declined.     Has patient been referred to the Quitline?: Patient does not use tobacco/nicotine products  Patient has been referred for addiction treatment: No known substance use disorder.  Felecia Shelling Christana Angelica, LCSWA 12/28/2022, 10:19 AM

## 2022-12-28 NOTE — [facility]HH Suicide Risk Assessment (Signed)
[facility] Discharge Suicide Risk Assessment   Principal Problem: Suicidal behavior without attempted self-injury Discharge Diagnoses: Principal Problem:   Suicidal behavior without attempted self-injury   Total Time spent with patient: 1.5 hours  Musculoskeletal: Strength & Muscle Tone: within normal limits Gait & Station: normal Patient leans: N/A  Psychiatric Specialty Exam  Presentation  General Appearance:  Appropriate for Environment; Neat  Eye Contact: Good  Speech: Clear and Coherent  Speech Volume: Normal  Handedness: Right   Mood and Affect  Mood: Anxious  Duration of Depression Symptoms: Greater than two weeks  Affect: Flat   Thought Process  Thought Processes: Coherent  Descriptions of Associations:Intact  Orientation:Full (Time, Place and Person) (and situation)  Thought Content:WDL  History of Schizophrenia/Schizoaffective disorder:No  Duration of Psychotic Symptoms:No data recorded Hallucinations:Hallucinations: None  Ideas of Reference:None  Suicidal Thoughts:Suicidal Thoughts: No  Homicidal Thoughts:Homicidal Thoughts: No   Sensorium  Memory: Immediate Good; Remote Good  Judgment: Fair  Insight: Poor   Executive Functions  Concentration: Fair  Attention Span: Fair  Recall: Good  Fund of Knowledge: Good  Language: Good   Psychomotor Activity  Psychomotor Activity: Psychomotor Activity: Normal   Assets  Assets: Communication Skills; Desire for Improvement; Financial Resources/Insurance; Housing; Social Support   Sleep  Sleep: Sleep: Good Number of Hours of Sleep: 7   Physical Exam: Physical Exam Vitals and nursing note reviewed.  Constitutional:      Appearance: Normal appearance.  HENT:     Head: Normocephalic and atraumatic.     Nose: Congestion present.  Pulmonary:     Effort: Pulmonary effort is normal.  Musculoskeletal:        General: Normal range of motion.     Cervical back:  Normal range of motion.  Neurological:     General: No focal deficit present.     Mental Status: She is alert and oriented to person, place, and time. Mental status is at baseline.  Psychiatric:        Attention and Perception: Attention and perception normal.        Mood and Affect: Mood normal. Affect is flat.        Speech: Speech normal.        Behavior: Behavior normal. Behavior is cooperative.        Thought Content: Thought content normal.        Cognition and Memory: Cognition and memory normal.        Judgment: Judgment normal.    Review of Systems  HENT:  Positive for nosebleeds.   All other systems reviewed and are negative.  Blood pressure 115/63, pulse 96, temperature 99.1 F (37.3 C), resp. rate 18, height 5\' 8"  (1.727 m), weight 122 kg, last menstrual period 12/08/2022, SpO2 95%. Body mass index is 40.9 kg/m.  Mental Status Per Nursing Assessment::   On Admission:  Suicidal ideation indicated by others  Demographic Factors:  Adolescent or young adult  Loss Factors: NA  Historical Factors: Prior suicide attempts and Family history of mental illness or substance abuse  Risk Reduction Factors:   Sense of responsibility to family, Living with another person, especially a relative, Positive therapeutic relationship, and Positive coping skills or problem solving skills  Continued Clinical Symptoms:  Depression:   Comorbid alcohol abuse/dependence Insomnia Alcohol/Substance Abuse/Dependencies Personality Disorders:   Cluster B Comorbid alcohol abuse/dependence Medical Diagnoses and Treatments/Surgeries  Cognitive Features That Contribute To Risk:  None    Suicide Risk:  Minimal: No identifiable suicidal ideation.  Patients presenting with  no risk factors but with morbid ruminations; may be classified as minimal risk based on the severity of the depressive symptoms   Follow-up Information     [facility], [facility]. Go in 1 week(s).   Why: Same-Day  Access Hours:  Monday, Wednesday and Friday, 8am - 3pm Crisis & Diversion Center: Walk-In Crisis Hours: 7 days/week, 8am - 12am Contact information: [address] [place] [address] [phone]                 Plan Of Care/Follow-up recommendations:  Activity:  as tolerated Diet:  heart healthy Fluticasone (Flonase): 50 mcg/ACT nasal spray, 2 sprays each nare daily. Insulin Glargine (Semglee): 5 units subcutaneous QHS. Hydroxyzine (Atarax): 25 mg TID PRN for anxiety. Trazodone (Desyrel): 50 mg QHS PRN for sleep. National Suicide Prevention Lifeline: 1-800-273-TALK 403-387-6987) Crisis Text Line: Text HOME to (281)005-1895 Follow-up with Walk-In Crisis Hours: 7 days/week, 8am - 12am Contact information: [address] [place] [address] [phone]  Foutpatient care within 7 days.for PCP diabetes management  Follow uo  Myriam Forehand, NP 12/28/2022, 11:56 AM

## 2022-12-28 NOTE — Group Note (Signed)
Date:  12/28/2022 Time:  11:17 AM  Group Topic/Focus:  Goals Group:   The focus of this group is to help patients establish daily goals to achieve during treatment and discuss how the patient can incorporate goal setting into their daily lives to aide in recovery.    Participation Level:  Active  Participation Quality:  Appropriate  Affect:  Appropriate  Cognitive:  Appropriate  Insight: Appropriate  Engagement in Group:  Engaged  Modes of Intervention:  Activity and Discussion  Additional Comments:    [name] 12/28/2022, 11:17 AM

## 2022-12-28 NOTE — Progress Notes (Signed)
   12/28/22 1000  Psych Admission Type (Psych Patients Only)  Admission Status Involuntary  Psychosocial Assessment  Patient Complaints None  Eye Contact Fair  Facial Expression Flat  Affect Flat  Speech Logical/coherent  Interaction Assertive  Motor Activity Slow  Appearance/Hygiene Unremarkable  Behavior Characteristics Cooperative  Mood Pleasant  Thought Process  Coherency WDL  Content WDL  Delusions None reported or observed  Perception WDL  Hallucination None reported or observed  Judgment WDL  Confusion None  Danger to Self  Current suicidal ideation? Denies  Danger to Others  Danger to Others None reported or observed

## 2023-04-06 ENCOUNTER — Ambulatory Visit
Admission: EM | Admit: 2023-04-06 | Discharge: 2023-04-06 | Disposition: A | Discharge status: Home / Self Care | Payer: MEDICAID | Attending: Emergency Medicine | Admitting: Emergency Medicine

## 2023-04-06 DIAGNOSIS — N898 Other specified noninflammatory disorders of vagina: Secondary | ICD-10-CM | POA: Insufficient documentation

## 2023-04-06 DIAGNOSIS — Z113 Encounter for screening for infections with a predominantly sexual mode of transmission: Secondary | ICD-10-CM | POA: Insufficient documentation

## 2023-04-06 DIAGNOSIS — J029 Acute pharyngitis, unspecified: Secondary | ICD-10-CM | POA: Insufficient documentation

## 2023-04-06 LAB — POCT URINE PREGNANCY: Preg Test, Ur: NEGATIVE

## 2023-04-06 LAB — POCT RAPID STREP A (OFFICE): Rapid Strep A Screen: NEGATIVE

## 2023-04-06 NOTE — Discharge Instructions (Addendum)
 The strep test is negative.  Take Tylenol or ibuprofen as needed.    Your STD tests are pending.  If your test results are positive, we will call you.  You and your sexual partner(s) may require treatment at that time.  Do not have sexual activity for at least 7 days.    Follow-up with your primary care provider if your symptoms are not improving.

## 2023-04-06 NOTE — ED Triage Notes (Addendum)
 Patient to Urgent Care with complaints of sore throat. Reports symptoms started yesterday. Using peroxide/ salt water.   Also w/ reports of vaginal discharge. Treated for yeast and BV in January but has continued symptoms. Has concerns about a potential STD.
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
## 2023-04-07 ENCOUNTER — Telehealth (HOSPITAL_COMMUNITY): Payer: Self-pay

## 2023-04-07 LAB — CERVICOVAGINAL ANCILLARY ONLY
Bacterial Vaginitis (gardnerella): POSITIVE — AB
Candida Glabrata: NEGATIVE
Candida Vaginitis: POSITIVE — AB
Chlamydia: NEGATIVE
Comment: NEGATIVE
Comment: NEGATIVE
Comment: NEGATIVE
Comment: NEGATIVE
Comment: NEGATIVE
Comment: NORMAL
Neisseria Gonorrhea: NEGATIVE
Trichomonas: NEGATIVE

## 2023-04-07 MED ORDER — FLUCONAZOLE 150 MG PO TABS: 150.0000 mg | ORAL_TABLET | Freq: Once | ORAL | 0 refills | Status: AC

## 2023-04-07 MED ORDER — METRONIDAZOLE 500 MG PO TABS
500.0000 mg | ORAL_TABLET | Freq: Two times a day (BID) | ORAL | 0 refills | Status: AC
Start: 2023-04-07 — End: 2023-04-14

## 2023-04-07 NOTE — Telephone Encounter (Signed)
 Per protocol, pt requires tx with metronidazole and Diflucan.  Rx sent to pharmacy on file.

## 2023-04-08 LAB — HIV ANTIBODY (ROUTINE TESTING W REFLEX): HIV Screen 4th Generation wRfx: NONREACTIVE

## 2023-04-08 LAB — RPR: RPR Ser Ql: NONREACTIVE

## 2023-06-27 ENCOUNTER — Encounter (HOSPITAL_BASED_OUTPATIENT_CLINIC_OR_DEPARTMENT_OTHER): Payer: Self-pay

## 2023-06-27 ENCOUNTER — Other Ambulatory Visit: Payer: Self-pay

## 2023-06-27 ENCOUNTER — Emergency Department (HOSPITAL_BASED_OUTPATIENT_CLINIC_OR_DEPARTMENT_OTHER)
Admission: EM | Admit: 2023-06-27 | Discharge: 2023-06-28 | Disposition: A | Discharge status: Home / Self Care | Payer: MEDICAID | Attending: Emergency Medicine | Admitting: Emergency Medicine

## 2023-06-27 ENCOUNTER — Emergency Department (HOSPITAL_BASED_OUTPATIENT_CLINIC_OR_DEPARTMENT_OTHER): Payer: MEDICAID

## 2023-06-27 DIAGNOSIS — E119 Type 2 diabetes mellitus without complications: Secondary | ICD-10-CM | POA: Insufficient documentation

## 2023-06-27 DIAGNOSIS — N730 Acute parametritis and pelvic cellulitis: Secondary | ICD-10-CM | POA: Diagnosis not present

## 2023-06-27 DIAGNOSIS — B9689 Other specified bacterial agents as the cause of diseases classified elsewhere: Secondary | ICD-10-CM | POA: Diagnosis not present

## 2023-06-27 DIAGNOSIS — J45909 Unspecified asthma, uncomplicated: Secondary | ICD-10-CM | POA: Insufficient documentation

## 2023-06-27 DIAGNOSIS — N7011 Chronic salpingitis: Secondary | ICD-10-CM | POA: Diagnosis not present

## 2023-06-27 DIAGNOSIS — R1030 Lower abdominal pain, unspecified: Secondary | ICD-10-CM | POA: Diagnosis present

## 2023-06-27 DIAGNOSIS — N760 Acute vaginitis: Secondary | ICD-10-CM | POA: Insufficient documentation

## 2023-06-27 LAB — URINALYSIS, ROUTINE W REFLEX MICROSCOPIC
Bilirubin Urine: NEGATIVE
Glucose, UA: NEGATIVE mg/dL
Hgb urine dipstick: NEGATIVE
Ketones, ur: NEGATIVE mg/dL
Leukocytes,Ua: NEGATIVE
Nitrite: NEGATIVE
Protein, ur: NEGATIVE mg/dL
Specific Gravity, Urine: 1.02 (ref 1.005–1.030)
pH: 7 (ref 5.0–8.0)

## 2023-06-27 LAB — COMPREHENSIVE METABOLIC PANEL WITH GFR
ALT: 10 U/L (ref 0–44)
AST: 16 U/L (ref 15–41)
Albumin: 3.9 g/dL (ref 3.5–5.0)
Alkaline Phosphatase: 85 U/L (ref 38–126)
Anion gap: 10 (ref 5–15)
BUN: 11 mg/dL (ref 6–20)
CO2: 26 mmol/L (ref 22–32)
Calcium: 8.9 mg/dL (ref 8.9–10.3)
Chloride: 104 mmol/L (ref 98–111)
Creatinine, Ser: 0.72 mg/dL (ref 0.44–1.00)
GFR, Estimated: >60 mL/min (ref >60)
Glucose, Bld: 108 mg/dL — ABNORMAL HIGH (ref 70–99)
Potassium: 4.1 mmol/L (ref 3.5–5.1)
Sodium: 141 mmol/L (ref 135–145)
Total Bilirubin: <0.2 mg/dL (ref 0.0–1.2)
Total Protein: 8 g/dL (ref 6.5–8.1)

## 2023-06-27 LAB — CBC
HCT: 34.9 % — ABNORMAL LOW (ref 36.0–46.0)
Hemoglobin: 11.2 g/dL — ABNORMAL LOW (ref 12.0–15.0)
MCH: 27.1 pg (ref 26.0–34.0)
MCHC: 32.1 g/dL (ref 30.0–36.0)
MCV: 84.5 fL (ref 80.0–100.0)
Platelets: 440 10*3/uL — ABNORMAL HIGH (ref 150–400)
RBC: 4.13 MIL/uL (ref 3.87–5.11)
RDW: 14.6 % (ref 11.5–15.5)
WBC: 8.9 10*3/uL (ref 4.0–10.5)
nRBC: 0 % (ref 0.0–0.2)

## 2023-06-27 LAB — LIPASE, BLOOD: Lipase: 24 U/L (ref 11–51)

## 2023-06-27 LAB — WET PREP, GENITAL
Sperm: NONE SEEN
Trich, Wet Prep: NONE SEEN
WBC, Wet Prep HPF POC: >=10 — AB (ref <10)
Yeast Wet Prep HPF POC: NONE SEEN

## 2023-06-27 LAB — PREGNANCY, URINE: Preg Test, Ur: NEGATIVE

## 2023-06-27 MED ORDER — IOHEXOL 300 MG/ML  SOLN
100.0000 mL | Freq: Once | INTRAMUSCULAR | Status: AC | PRN
  Administered 2023-06-27: 100 mL via INTRAVENOUS

## 2023-06-27 NOTE — ED Triage Notes (Signed)
 Pt reports that she is having some abdominal pain and pelvic pain x 1 week. States that she had her period last week and is still spotting. States that she is also having some discharge.States that she has some nausea no vomiting

## 2023-06-28 ENCOUNTER — Emergency Department (HOSPITAL_BASED_OUTPATIENT_CLINIC_OR_DEPARTMENT_OTHER)
Admit: 2023-06-28 | Discharge: 2023-06-28 | Disposition: A | Discharge status: Home / Self Care | Payer: MEDICAID | Attending: Emergency Medicine | Admitting: Emergency Medicine

## 2023-06-28 ENCOUNTER — Telehealth (HOSPITAL_BASED_OUTPATIENT_CLINIC_OR_DEPARTMENT_OTHER): Payer: Self-pay | Admitting: Emergency Medicine

## 2023-06-28 MED ORDER — DOXYCYCLINE HYCLATE 100 MG PO TABS
100.0000 mg | ORAL_TABLET | Freq: Once | ORAL | Status: AC
  Administered 2023-06-28: 100 mg via ORAL
  Filled 2023-06-28: qty 1

## 2023-06-28 MED ORDER — CEFTRIAXONE SODIUM 500 MG IJ SOLR
500.0000 mg | Freq: Once | INTRAMUSCULAR | Status: AC
  Administered 2023-06-28: 500 mg via INTRAMUSCULAR
  Filled 2023-06-28: qty 500

## 2023-06-28 MED ORDER — METRONIDAZOLE 500 MG PO TABS: 500.0000 mg | ORAL_TABLET | Freq: Two times a day (BID) | ORAL | 0 refills | Status: AC

## 2023-06-28 MED ORDER — METRONIDAZOLE 500 MG PO TABS
500.0000 mg | ORAL_TABLET | Freq: Once | ORAL | Status: AC
  Administered 2023-06-28: 500 mg via ORAL
  Filled 2023-06-28: qty 1

## 2023-06-28 MED ORDER — DOXYCYCLINE HYCLATE 100 MG PO CAPS: 100.0000 mg | ORAL_CAPSULE | Freq: Two times a day (BID) | ORAL | 0 refills | Status: AC

## 2023-06-28 MED ORDER — CELECOXIB 200 MG PO CAPS: 200.0000 mg | ORAL_CAPSULE | Freq: Two times a day (BID) | ORAL | 0 refills | Status: AC | PRN

## 2023-06-28 NOTE — Telephone Encounter (Cosign Needed)
 Patient returning to the ED today for pelvic ultrasound and following up on results.  Ultrasound showed right-sided hemorrhagic cyst, right-sided hydrosalpinx versus loculated fluid collection with no evidence of torsion.  Patient already on antibiotics in the form of Flagyl , doxycycline for concern for PID/hydrosalpinx from prior workup yesterday.  Will begin NSAID as well for treatment of pain.  Follow-up with OB/GYN recommended.

## 2023-06-28 NOTE — Discharge Instructions (Addendum)
 You were seen in ER today for evaluation of your lower pelvic pain. You have multiple fibroids, cyst, and a hydrosalpinx. After speaking with our gynecology colleague, he recommends treating you for pelvic inflammatory disease.  I have included more information on this into the discharge paperwork.  For this, you have received a shot here.  You will be on 2 weeks of medications that you will take twice daily.  His office will reach out to you either Monday or Tuesday to schedule an appointment.  Please make sure you answer your phone.  You will return tomorrow to get an ultrasound here at [facility].  If you have any worsening pain, fever, vaginal bleeding, nausea, vomiting, please return to your nearest emergency department for reevaluation.  If you have any other concerns, new or worsening symptoms, please return to your nearest emergency department for reevaluation.  DO NOT consume alcohol on this medication as it will make you VERY ill!  Contact a doctor if: Medicine does not help your pain, or your pain comes back. You have new symptoms. You have unusual discharge or bleeding from your vagina. You have a fever or chills. You are having trouble pooping (constipation). You have blood in your pee (urine) or poop (stool). Your pee smells bad. You feel weak or light-headed. Get help right away if: You have sudden pain that is very bad. You have very bad pain and also have any of these symptoms: A fever. Feeling like you may vomit (nauseous). Vomiting. Being very sweaty. You faint. These symptoms may be an emergency. Get help right away. Call your local emergency services (911 in the [place] Do not wait to see if the symptoms will go away. Do not drive yourself to the hospital.

## 2023-06-28 NOTE — ED Provider Notes (Addendum)
 [facility] Provider Note   CSN: [id number] Arrival date & time: 06/27/23  1756     History Chief Complaint  Patient presents with   Abdominal Pain    Shari [name] is a 28 y.o. female with history of uterine fibroids, type 2 diabetes, asthma presents emergency department today for evaluation of over a week of very lower abdominal pain reports some radiation to her lower back for greater than 7 days.  Denies any back red flag symptoms such as fever, chills, saddle anesthesia, or fecal or urinary incontinence.  Denies any weakness to the lower extremities as well.  She reports that she has a known history of fibroids however was controlled previously and has not had a problem with them in a few years.  She reports her last period was 06-21-2023 and acetic light vaginal spotting since then.  Reports some softer stools than she is used to but no watery or hardened stools.  Not black or bloody.  Denies any fever, chills, dysuria, hematuria, frequency, urgency.  Denies any melena or hematochezia.  Denies any cough or flulike symptoms.  She reports that she was treated with gonorrhea earlier this year however completed the entirety of the course.  He is no interstitially active with that person.  She is sexually active with a new female partner however uses condoms every time and is not concerned with STD symptoms.  Currently not on any birth control.  She is not taking anything for her symptoms.  She denies any tobacco, EtOH illicit drug use.   Abdominal Pain Associated symptoms: vaginal discharge   Associated symptoms: no chest pain, no chills, no constipation, no cough, no diarrhea, no dysuria, no fever, no hematuria, no nausea, no shortness of breath, no vaginal bleeding and no vomiting        Home Medications Prior to Admission medications   Medication Sig Start Date End Date Taking? Authorizing Provider  doxycycline  (VIBRAMYCIN ) 100 MG capsule  Take 1 capsule (100 mg total) by mouth 2 (two) times daily for 14 days. 06/28/23 07/12/23 Yes Spence Dux, PA-C  metroNIDAZOLE  (FLAGYL ) 500 MG tablet Take 1 tablet (500 mg total) by mouth 2 (two) times daily for 14 days. 06/28/23 07/12/23 Yes Spence Dux, PA-C  albuterol  (VENTOLIN  HFA) 108 [phone] Base) MCG/ACT inhaler Inhale 2 puffs into the lungs every 4 (four) hours as needed for up to 30 doses for wheezing or shortness of breath. 12/28/22   Rosalene Colon, NP  fluticasone  (FLONASE ) 50 MCG/ACT nasal spray Place 2 sprays into both nostrils daily for 5 doses. 12/29/22 01/03/23  Rosalene Colon, NP  ipratropium (ATROVENT  HFA) 17 MCG/ACT inhaler Inhale 2 puffs into the lungs every 6 (six) hours. 12/28/22 01/27/23  Rosalene Colon, NP  rosuvastatin (CRESTOR) 40 MG tablet Take 1 tablet by mouth daily. 02/25/23 02/25/24  [provider]  RYBELSUS 7 MG TABS Take 1 tablet by mouth daily.    [provider]  TOUJEO  SOLOSTAR 300 UNIT/ML Solostar Pen SMARTSIG:30-50 Unit(s) SUB-Q Every Night    [provider]  traZODone  (DESYREL ) 50 MG tablet Take 1 tablet (50 mg total) by mouth at bedtime as needed for sleep. 12/28/22 01/27/23  Rosalene Colon, NP      Allergies    Sulfa antibiotics and Tramadol    Review of Systems   Review of Systems  Constitutional:  Negative for chills and fever.  HENT:  Negative for congestion and rhinorrhea.   Respiratory:  Negative for cough and shortness of breath.   Cardiovascular:  Negative for chest pain.  Gastrointestinal:  Positive for abdominal pain. Negative for blood in stool, constipation, diarrhea, nausea and vomiting.       Denies any fecal incontinence  Genitourinary:  Positive for pelvic pain and vaginal discharge. Negative for dysuria, frequency, hematuria, urgency and vaginal bleeding.       Denies any urinary incontinence or urinary retention.  Musculoskeletal:  Negative for back pain.       Denies any saddle anesthesia  Neurological:  Negative  for weakness and numbness.    Physical Exam Updated Vital Signs BP 129/72   Pulse 98   Temp 98 F (36.7 C)   Resp 18   Ht 5' 8 (1.727 m)   Wt 113.4 kg   SpO2 95%   BMI 38.01 kg/m  Physical Exam Vitals and nursing note reviewed. Exam conducted with a chaperone present Jorden Nevin, Tech).  Constitutional:      General: She is not in acute distress.    Appearance: She is not ill-appearing or toxic-appearing.     Comments: On phone in no acute distress  Cardiovascular:     Rate and Rhythm: Normal rate.  Pulmonary:     Effort: Pulmonary effort is normal. No respiratory distress.  Abdominal:     General: Bowel sounds are normal. There is no distension.     Palpations: Abdomen is soft.     Tenderness: There is abdominal tenderness in the suprapubic area. There is no guarding or rebound.  Genitourinary:    Cervix: No cervical motion tenderness.     Comments: Small amount of darker blood present with small amount of translucent white discharge.  No cervical friability.  No cervical motion tenderness. Skin:    General: Skin is warm and dry.  Neurological:     Mental Status: She is alert.     ED Results / Procedures / Treatments   Labs (all labs ordered are listed, but only abnormal results are displayed) Labs Reviewed  WET PREP, GENITAL - Abnormal; Notable for the following components:      Result Value   Clue Cells Wet Prep HPF POC PRESENT (*)    WBC, Wet Prep HPF POC >=10 (*)    All other components within normal limits  COMPREHENSIVE METABOLIC PANEL WITH GFR - Abnormal; Notable for the following components:   Glucose, Bld 108 (*)    All other components within normal limits  CBC - Abnormal; Notable for the following components:   Hemoglobin 11.2 (*)    HCT 34.9 (*)    Platelets 440 (*)    All other components within normal limits  URINE CULTURE  LIPASE, BLOOD  URINALYSIS, ROUTINE W REFLEX MICROSCOPIC  PREGNANCY, URINE  GC/CHLAMYDIA PROBE AMP ([place]) NOT AT [facility]     EKG None  Radiology CT ABDOMEN PELVIS W CONTRAST Result Date: 06/27/2023 CLINICAL DATA:  Left lower quadrant and right lower quadrant abdominal pain. EXAM: CT ABDOMEN AND PELVIS WITH CONTRAST TECHNIQUE: Multidetector CT imaging of the abdomen and pelvis was performed using the standard protocol following bolus administration of intravenous contrast. RADIATION DOSE REDUCTION: This exam was performed according to the departmental dose-optimization program which includes automated exposure control, adjustment of the mA and/or kV according to patient size and/or use of iterative reconstruction technique. CONTRAST:  OMNIPAQUE  IOHEXOL  300 MG/ML  SOLN COMPARISON:  CT abdomen and pelvis 05/21/2018 FINDINGS: Lower chest: No acute abnormality. Hepatobiliary: No focal liver abnormality is  seen. No gallstones, gallbladder wall thickening, or biliary dilatation. Pancreas: Unremarkable. No pancreatic ductal dilatation or surrounding inflammatory changes. Spleen: Normal in size without focal abnormality. Adrenals/Urinary Tract: Adrenal glands are unremarkable. Kidneys are normal, without renal calculi, focal lesion, or hydronephrosis. Bladder is unremarkable. Stomach/Bowel: Stomach is within normal limits. Appendix appears normal. No evidence of bowel wall thickening, distention, or inflammatory changes. Vascular/Lymphatic: No significant vascular findings are present. No enlarged abdominal or pelvic lymph nodes. Reproductive: There is a new right adnexal rounded low-attenuation area measuring 3.4 x 4.5 cm with questionable hyperdense fluid fluid level. Questionable dilated tubular structure in the right adnexa as well which is new from prior. The uterus remains enlarged, lobulated and heterogeneous compatible with fibroid disease. The left ovary appears within normal limits. Other: No abdominal wall hernia or abnormality. No abdominopelvic ascites. Musculoskeletal: No fracture is seen. IMPRESSION: 1. New right  adnexal rounded low-attenuation area measuring 4.5 cm with questionable hyperdense fluid fluid level. Findings may represent a hemorrhagic cyst or endometrioma. Recommend further evaluation with pelvic ultrasound. 2. Questionable dilated tubular structure in the right adnexa which may represent hydrosalpinx. This can be further evaluated with pelvic ultrasound. 3. Fibroid uterus. Electronically Signed   By: Tyron Gallon M.D.   On: 06/27/2023 23:49    Procedures Procedures    Medications Ordered in ED Medications  iohexol  (OMNIPAQUE ) 300 MG/ML solution 100 mL (100 mLs Intravenous Contrast Given 06/27/23 2344)  cefTRIAXone  (ROCEPHIN ) injection 500 mg (500 mg Intramuscular Given 06/28/23 0110)  doxycycline  (VIBRA -TABS) tablet 100 mg (100 mg Oral Given 06/28/23 0110)  metroNIDAZOLE  (FLAGYL ) tablet 500 mg (500 mg Oral Given 06/28/23 0110)    ED Course/ Medical Decision Making/ A&P    Medical Decision Making Amount and/or Complexity of Data Reviewed Labs: ordered. Radiology: ordered.  Risk Prescription drug management.   28 y.o. female presents to the ER for evaluation of lower abdominal pain with some vaginal spotting >1 week. Differential diagnosis includes but is not limited to AAA, mesenteric ischemia, appendicitis, diverticulitis, DKA, gastroenteritis, nephrolithiasis, pancreatitis, constipation, UTI, bowel obstruction, biliary disease, IBD, PUD, hepatitis, ectopic pregnancy, ovarian torsion, PID. Vital signs unremarkable. Physical exam as noted above.   Patient does not appear in any acute distress.  Hemodynamically stable.  Has some mild tenderness palpation of the very low abdomen.  Unfortunately, ultrasound is gone for the day, we will proceed with CT imaging.  Given patient's presentation and length of symptoms as well as her vital signs, she does not appear in any acute distress and that it is less likely ovarian torsion.  I independently reviewed and interpreted the patient's labs.   CMP is glucose at 108 otherwise no other electrolyte or LFT abnormality.  CBC shows mild anemia with a hemoglobin 1.2 however appears be chronic with patient's previous.  No leukocytosis.  Urinalysis unremarkable.  Pregnancy negative.  Lipase within normal limits.  Wet prep shows greater than 10 white blood cells with clue cells present.  GC pending.  Urine culture pending given suprapubic tenderness.  CT imaging shows 1. New right adnexal rounded low-attenuation area measuring 4.5 cm with questionable hyperdense fluid fluid level. Findings may represent a hemorrhagic cyst or endometrioma. Recommend further evaluation with pelvic ultrasound. 2. Questionable dilated tubular structure in the right adnexa which may represent hydrosalpinx. This can be further evaluated with pelvic ultrasound. 3. Fibroid uterus. Per radiologist's interpretation.    Unfortunately, we do not have US  here. Given that the pain has been going on for a week, she is  hemodynamically stable, does not appear in any acute distress and has been resting/sleeping on the stretcher, and the pain has been going on for >1 week, I think this is less like torsion. She was treated for gonorrhea earlier in the year and reports compliance to the treatment.  She did not get any sets of clearance.  She is no longer present.  She is sexually active with a new person which she reports condom use every time and is not concerned for any STDs.  Patient does not have gynecologist follow-up given that she just moved to the area.  Will page on-call gynecology for their input.  I consulted gynecology spoke with Dr. Aquilla Knapp.  He reviewed the patient's images.  We discussed the patient's presentation and physical examination findings.  He reports that he would go ahead and treat for PID despite her not having cervical motion tenderness.  Does not think any admission needs to be done given that she is hemodynamically stable without a white count.  He agrees that this  is significantly less likely for torsion given patient's presentation and length of symptoms.  He does not think the patient needs an emergent ultrasound and agrees patient come tomorrow to get an ultrasound.  He will have his office reach out on Monday or Tuesday to schedule an appointment with her.  Lower suspicion this is any pyleosalphinx given no white count and again no complaints of fevers or chills and is hemodynamically stable.  The patient is sleeping comfortably on the stretcher in no acute distress. I doubt that this is ovarian torsion. I do not think there is an emergent need for US  tonight for this.   I discussed with patient my conversation I had with gynecology in addition to the other imaging findings that we had discussed earlier.  Will treat her with a shot of Rocephin  and give her 2 weeks of Doxy and Flagyl  which will also help with the bacterial vaginosis.  We discussed abstaining from sexual intercourse to wait for gonorrhea and chlamydia results as well as until she sees gynecology.  She assures that she will return if she has any new or worsening symptoms.  We discussed the importance of medical outpatient adherence.  She verbalized understand that she will need to return tomorrow for ultrasound.  She is stable for discharge home with close outpatient follow-up and strict return precautions and ultrasound tomorrow.  We discussed the results of the labs/imaging. The plan is return tomorrow for ultrasound, antibiotics, follow-up closely with gynecology. We discussed strict return precautions and red flag symptoms. The patient verbalized their understanding and agrees to the plan. The patient is stable and being discharged home in good condition.  Portions of this report may have been transcribed using voice recognition software. Every effort was made to ensure accuracy; however, inadvertent computerized transcription errors may be present.    Final Clinical Impression(s) / ED  Diagnoses Final diagnoses:  Hydrosalpinx  PID (acute pelvic inflammatory disease)  Bacterial vaginosis    Rx / DC Orders ED Discharge Orders          Ordered    doxycycline  (VIBRAMYCIN ) 100 MG capsule  2 times daily        06/28/23 0104    metroNIDAZOLE  (FLAGYL ) 500 MG tablet  2 times daily        06/28/23 0104    US  PELVIC COMPLETE W TRANSVAGINAL AND TORSION R/O        06/28/23 0104  Spence Dux, PA-C 07/02/23 1639    Spence Dux, PA-C 07/02/23 1641    Lowery Rue, DO 07/10/23 1503

## 2023-06-28 NOTE — ED Notes (Signed)
 Pt is resting at this time. Pt expresses no complains. Pt VSS. Pt bed is locked and at lowest position. Rounding performed.    Pt show no signs of distress at discharge.

## 2023-06-29 LAB — URINE CULTURE

## 2023-06-30 LAB — GC/CHLAMYDIA PROBE AMP (~~LOC~~) NOT AT ARMC
Chlamydia: NEGATIVE
Comment: NEGATIVE
Comment: NORMAL
Neisseria Gonorrhea: NEGATIVE

## 2023-07-08 NOTE — Progress Notes (Unsigned)
 GYNECOLOGY OFFICE VISIT NOTE  History:  Shari [name] is a 28 y.o. G1P0010 here today for ER follow up. She presented to ED with 2 day history of pelvic pain.   Historically:  She has a history since age 20-12 of dysmenorrhea. She has tried various forms of birth control for her heavy and painful periods. She clinically was diagnosed with endometriosis but has never had surgery for it for definitive diagnosis. She did hormonal birth control for several years until she came off it in 2023 (nexplanon removed) and was doing better until more recently when she presented to the ED.   In the ED she had a CT and pelvic US . She was treated for PID in the ED on 5/31 at the request of Dr. Aquilla Knapp who the ED consulted. Her wet prep was positive for BV and she has been on Doxy and Flagyl  since her ED visit. She has been GC pos in the past but GCCT was negative on 5/30.   US  report from 5/31:  1. 7.2 cm fundal fibroid with a submucosal component.  2. 4.9 cm right ovarian hemorrhagic cyst.   3. 5.3 cm tubular fluid-filled area inferior to the right ovary containing a mildly thickened internal septation. This could represent a hydrosalpinx or small loculated fluid collection. 4. No evidence of ovarian torsion.  I reviewed images from CT and US .  Uterus is 10 cm x 16 cm x11 cm.  US  shows one fibroid, but appears to have multiple by CT.  Cyst is posterior to uterus and on the right side. Has one thin septation. Does appear homogenous like an endometrioma.   Other provider note review:  Reviewed note from 10/2020 - has h/o myofascial pain vs endometriosis vs both >>> presumptive endometriosis diagnosis.  Known history of fibroids. 2/22: hgb--11.9 Pap--NILM/HrHPV neg   Fertility plans: She does wish to TTC and very soon and thinks in about 3 months. She and her boyfriend have been discussing it.    Past Medical History:  Diagnosis Date   Anxiety    Asthma    well controlled   Diabetes mellitus  without complication (HCC)    pt states she went off her meds because her numbers were fine   Endometriosis    Hidradenitis suppurativa 12/31 /2019   Pneumonia 01/2017   Refusal of blood product     Past Surgical History:  Procedure Laterality Date   ADENOIDECTOMY Bilateral 02/09/2018   Procedure: REVISION ADENOIDECTOMY;  Surgeon: Rogers Clayman, MD;  Location: [facility];  Service: ENT;  Laterality: Bilateral;   EYE SURGERY     MYRINGOTOMY WITH TUBE PLACEMENT Right 02/09/2018   Procedure: MYRINGOTOMY WITH TUBE PLACEMENT;  Surgeon: Rogers Clayman, MD;  Location: [facility];  Service: ENT;  Laterality: Right;   NASOPHARYNGOSCOPY EUSTATION TUBE BALLOON DILATION Bilateral 02/09/2018   Procedure: NASOPHARYNGOSCOPY EUSTATION TUBE BALLOON DILATION;  Surgeon: Rogers Clayman, MD;  Location: [facility];  Service: ENT;  Laterality: Bilateral;   TONSILLECTOMY     TURBINATE REDUCTION Right 02/09/2018   Procedure: OUTFRACTURE RIGHT INFERIOR TURBINATE;  Surgeon: Rogers Clayman, MD;  Location: [facility];  Service: ENT;  Laterality: Right;    The following portions of the patient's history were reviewed and updated as appropriate: allergies, current medications, past family history, past medical history, past social history, past surgical history and problem list.   Review of Systems:  Pertinent items noted in HPI and remainder of comprehensive ROS otherwise negative.  Physical Exam:  BP 114/76  Pulse 85   Ht 5' 8 (1.727 m)   Wt 252 lb 0.6 oz (114.3 kg)   LMP 06/16/2023 (Exact Date)   BMI 38.32 kg/m  CONSTITUTIONAL: Well-developed, well-nourished female in no acute distress.  HEENT:  Normocephalic, atraumatic. External right and left ear normal. No scleral icterus.  NECK: Normal range of motion, supple, no masses noted on observation SKIN: No rash noted. Not diaphoretic. No erythema. No pallor. MUSCULOSKELETAL: Normal range of motion. No edema noted. NEUROLOGIC: Alert and oriented to  person, place, and time. Normal muscle tone coordination. No cranial nerve deficit noted. PSYCHIATRIC: Normal mood and affect. Normal behavior. Normal judgment and thought content.  PELVIC: Deferred  Labs and Imaging No results found for this or any previous visit (from the past week). US  PELVIC COMPLETE W TRANSVAGINAL AND TORSION R/O Result Date: 06/28/2023 CLINICAL DATA:  Pelvic pain for the past 2 days with nausea. New 4.5 cm rounded low-attenuation area in the right adnexa and possible dilated tubular structure in the right adnexa on an abdomen and pelvis CT obtained last night. EXAM: TRANSABDOMINAL AND TRANSVAGINAL ULTRASOUND OF PELVIS DOPPLER ULTRASOUND OF OVARIES TECHNIQUE: Both transabdominal and transvaginal ultrasound examinations of the pelvis were performed. Transabdominal technique was performed for global imaging of the pelvis including uterus, ovaries, adnexal regions, and pelvic cul-de-sac. It was necessary to proceed with endovaginal exam following the transabdominal exam to visualize the endometrium and better visualize the uterus and ovaries. Color and duplex Doppler ultrasound was utilized to evaluate blood flow to the ovaries. COMPARISON:  Abdomen and pelvis CT dated 06/27/2023. Pelvic ultrasound dated 04/22/2017. FINDINGS: Uterus Measurements: 14.4 x 11.4 x 10.2 cm = volume: 880 mL. Large, oval, heterogeneous fundal mass measuring 7.2 x 7.0 x 5.9 cm. This is obscuring the endometrium in that portion of the uterus. Endometrium Thickness: 12.7 mm.  No focal abnormality visualized. Right ovary Measurements: 6.1 x 4.7 x 4.6 cm = volume: 69 mL. 4.9 x 4.0 x 3.9 cm rounded, homogeneously medium echotexture mass without internal blood flow with color Doppler. Additional small follicles. Left ovary Measurements: 4.1 x 3.1 x 2.4 cm = volume: 16 ML. Small follicles. No mass. Pulsed Doppler evaluation of both ovaries demonstrates normal low-resistance arterial and venous waveforms. Other findings  Tubular fluid-filled area inferior to the right ovary containing a mildly thickened internal septation, measuring 5.3 x 4.5 x 1.7 cm. IMPRESSION: 1. 7.2 cm fundal fibroid with a submucosal component. 2. 4.9 cm right ovarian hemorrhagic cyst. 3. 5.3 cm tubular fluid-filled area inferior to the right ovary containing a mildly thickened internal septation. This could represent a hydrosalpinx or small loculated fluid collection. 4. No evidence of ovarian torsion. Electronically Signed   By: Catherin Closs M.D.   On: 06/28/2023 12:33   CT ABDOMEN PELVIS W CONTRAST Result Date: 06/27/2023 CLINICAL DATA:  Left lower quadrant and right lower quadrant abdominal pain. EXAM: CT ABDOMEN AND PELVIS WITH CONTRAST TECHNIQUE: Multidetector CT imaging of the abdomen and pelvis was performed using the standard protocol following bolus administration of intravenous contrast. RADIATION DOSE REDUCTION: This exam was performed according to the departmental dose-optimization program which includes automated exposure control, adjustment of the mA and/or kV according to patient size and/or use of iterative reconstruction technique. CONTRAST:  OMNIPAQUE  IOHEXOL  300 MG/ML  SOLN COMPARISON:  CT abdomen and pelvis 05/21/2018 FINDINGS: Lower chest: No acute abnormality. Hepatobiliary: No focal liver abnormality is seen. No gallstones, gallbladder wall thickening, or biliary dilatation. Pancreas: Unremarkable. No pancreatic ductal dilatation or surrounding  inflammatory changes. Spleen: Normal in size without focal abnormality. Adrenals/Urinary Tract: Adrenal glands are unremarkable. Kidneys are normal, without renal calculi, focal lesion, or hydronephrosis. Bladder is unremarkable. Stomach/Bowel: Stomach is within normal limits. Appendix appears normal. No evidence of bowel wall thickening, distention, or inflammatory changes. Vascular/Lymphatic: No significant vascular findings are present. No enlarged abdominal or pelvic lymph nodes.  Reproductive: There is a new right adnexal rounded low-attenuation area measuring 3.4 x 4.5 cm with questionable hyperdense fluid fluid level. Questionable dilated tubular structure in the right adnexa as well which is new from prior. The uterus remains enlarged, lobulated and heterogeneous compatible with fibroid disease. The left ovary appears within normal limits. Other: No abdominal wall hernia or abnormality. No abdominopelvic ascites. Musculoskeletal: No fracture is seen. IMPRESSION: 1. New right adnexal rounded low-attenuation area measuring 4.5 cm with questionable hyperdense fluid fluid level. Findings may represent a hemorrhagic cyst or endometrioma. Recommend further evaluation with pelvic ultrasound. 2. Questionable dilated tubular structure in the right adnexa which may represent hydrosalpinx. This can be further evaluated with pelvic ultrasound. 3. Fibroid uterus. Electronically Signed   By: Tyron Gallon M.D.   On: 06/27/2023 23:49    I reviewed the images in person with the patient as well.   Assessment and Plan:  1. Uterine leiomyoma, unspecified location (Primary) Reviewed fibroids don't typically impact ability to get pregnant. Possible that it may influence ability to stay pregnant I.e. pregnancy loss or PTB.  Reviewed that if she were to have surgery on her uterus for fibroids, that because the largest fibroid seems to communicate with the endometrial cavity that it would be highly likely that we would recommend cesarean delivery no later than 37w due to risk of uterine rupture from labor.  - MR PELVIS W WO CONTRAST; Future  2. Hydrosalpinx Reviewed data on IVF and hydrosalpinx and pregnancy loss. Reviewed that this may not be applicable to spontaneously conceived pregnancies but if she has trouble getting pregnant or has RPL, then I would recommend removal of the right tube. Suspect it is due to distortion of the anatomy due to scarring from endometriosis.  - MR PELVIS W WO  CONTRAST; Future  3. Endometrioma of right ovary Discussed endometriosis and impact on fertility. Reviewed also that removal of endometrioma may also impact ovarian reserve. Ultimately combining these three factors, I encourage her to TTC as soon as she feels ready. We discussed MRI for possible preop planning, to follow sizes of the structures, and also to get better detail as each modality has had some limits.  I recommend follow up with Dr. Elester Grim following this MRI as she would be best served by robotic surgery should it be needed.  We discussed she could preemptively schedule surgery to be 6 months after TTC if pregnancy is not achieved in that amount of time because of Dr. Roy Cordoba schedule - MR PELVIS W WO CONTRAST; Future   Routine preventative health maintenance measures emphasized. Please refer to After Visit Summary for other counseling recommendations.   No follow-ups on file.  Lacey Pian, MD, FACOG Obstetrician & Gynecologist, [facility], [facility]

## 2023-07-09 ENCOUNTER — Encounter: Payer: Self-pay | Admitting: Obstetrics and Gynecology

## 2023-07-09 ENCOUNTER — Ambulatory Visit: Payer: MEDICAID | Admitting: Obstetrics and Gynecology

## 2023-07-09 VITALS — BP 114/76 | HR 85 | Ht 68.0 in | Wt 252.0 lb

## 2023-07-09 DIAGNOSIS — Z1331 Encounter for screening for depression: Secondary | ICD-10-CM

## 2023-07-09 DIAGNOSIS — N7011 Chronic salpingitis: Secondary | ICD-10-CM | POA: Diagnosis not present

## 2023-07-09 DIAGNOSIS — N80121 Deep endometriosis of right ovary: Secondary | ICD-10-CM | POA: Diagnosis not present

## 2023-07-09 DIAGNOSIS — D259 Leiomyoma of uterus, unspecified: Secondary | ICD-10-CM | POA: Diagnosis not present

## 2023-08-08 ENCOUNTER — Encounter: Payer: Self-pay | Admitting: Obstetrics and Gynecology

## 2023-08-12 ENCOUNTER — Inpatient Hospital Stay
Admission: RE | Admit: 2023-08-12 | Discharge: 2023-08-12 | Discharge status: Home / Self Care | Payer: MEDICAID | Source: Ambulatory Visit | Attending: Obstetrics and Gynecology

## 2023-08-12 DIAGNOSIS — N80121 Deep endometriosis of right ovary: Secondary | ICD-10-CM

## 2023-08-12 DIAGNOSIS — D259 Leiomyoma of uterus, unspecified: Secondary | ICD-10-CM

## 2023-08-12 DIAGNOSIS — N7011 Chronic salpingitis: Secondary | ICD-10-CM

## 2023-09-15 ENCOUNTER — Inpatient Hospital Stay: Admission: RE | Admit: 2023-09-15 | Payer: MEDICAID | Source: Ambulatory Visit

## 2023-10-02 ENCOUNTER — Encounter: Payer: Self-pay | Admitting: Emergency Medicine

## 2023-10-02 ENCOUNTER — Ambulatory Visit
Admission: EM | Admit: 2023-10-02 | Discharge: 2023-10-02 | Disposition: A | Discharge status: Home / Self Care | Payer: MEDICAID | Attending: Emergency Medicine | Admitting: Emergency Medicine

## 2023-10-02 DIAGNOSIS — N939 Abnormal uterine and vaginal bleeding, unspecified: Secondary | ICD-10-CM | POA: Diagnosis present
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
## 2023-10-02 NOTE — Discharge Instructions (Addendum)
Labs pending 2-3 days, you will be contacted if positive for any sti and treatment will be sent to the pharmacy, you will have to return to the clinic if positive for gonorrhea to receive treatment   Please refrain from having sex until labs results, if positive please refrain from having sex until treatment complete and symptoms resolve   If positive for HIV, Syphilis, Chlamydia  gonorrhea or trichomoniasis please notify partner or partners so they may tested as well  Moving forward, it is recommended you use some form of protection against the transmission of sti infections  such as condoms or dental dams with each sexual encounter   

## 2023-10-02 NOTE — ED Triage Notes (Signed)
 Patient reports vaginal bleeding x 1 month. Patient wants a complete STD check.

## 2023-10-03 LAB — CERVICOVAGINAL ANCILLARY ONLY
Bacterial Vaginitis (gardnerella): NEGATIVE
Candida Glabrata: NEGATIVE
Candida Vaginitis: NEGATIVE
Chlamydia: NEGATIVE
Comment: NEGATIVE
Comment: NEGATIVE
Comment: NEGATIVE
Comment: NEGATIVE
Comment: NEGATIVE
Comment: NORMAL
Neisseria Gonorrhea: NEGATIVE
Trichomonas: NEGATIVE

## 2023-10-03 LAB — HIV ANTIBODY (ROUTINE TESTING W REFLEX): HIV Screen 4th Generation wRfx: NONREACTIVE

## 2023-10-03 LAB — RPR: RPR Ser Ql: NONREACTIVE

## 2023-10-05 ENCOUNTER — Other Ambulatory Visit: Payer: MEDICAID

## 2023-10-14 ENCOUNTER — Ambulatory Visit: Payer: Self-pay | Admitting: Obstetrics and Gynecology

## 2023-10-14 ENCOUNTER — Ambulatory Visit
Admission: RE | Admit: 2023-10-14 | Discharge: 2023-10-14 | Disposition: A | Discharge status: Home / Self Care | Payer: MEDICAID | Source: Ambulatory Visit | Attending: Obstetrics and Gynecology | Admitting: Obstetrics and Gynecology

## 2023-10-14 MED ORDER — GADOPICLENOL 0.5 MMOL/ML IV SOLN
10.0000 mL | Freq: Once | INTRAVENOUS | Status: AC | PRN
  Administered 2023-10-14: 10 mL via INTRAVENOUS

## 2023-11-01 ENCOUNTER — Emergency Department (HOSPITAL_BASED_OUTPATIENT_CLINIC_OR_DEPARTMENT_OTHER): Payer: MEDICAID

## 2023-11-01 ENCOUNTER — Encounter (HOSPITAL_BASED_OUTPATIENT_CLINIC_OR_DEPARTMENT_OTHER): Payer: Self-pay | Admitting: Emergency Medicine

## 2023-11-01 ENCOUNTER — Emergency Department (HOSPITAL_BASED_OUTPATIENT_CLINIC_OR_DEPARTMENT_OTHER)
Admission: EM | Admit: 2023-11-01 | Discharge: 2023-11-01 | Disposition: A | Discharge status: Home / Self Care | Payer: MEDICAID | Attending: Emergency Medicine | Admitting: Emergency Medicine

## 2023-11-01 DIAGNOSIS — R519 Headache, unspecified: Secondary | ICD-10-CM | POA: Insufficient documentation

## 2023-11-01 DIAGNOSIS — Y92410 Unspecified street and highway as the place of occurrence of the external cause: Secondary | ICD-10-CM | POA: Insufficient documentation
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
## 2023-11-01 NOTE — ED Notes (Signed)
 Discharge instructions reviewed with patient. Patient verbalizes understanding, no further questions at this time. Medications and follow up information provided. No acute distress noted at time of departure.
